# Patient Record
Sex: Male | Born: 1950 | Race: White | Hispanic: No | Marital: Married | Smoking: Former smoker
Health system: Southern US, Community
[De-identification: ages and names within clinical notes are randomized; demographics above are authoritative.]

## PROBLEM LIST (undated history)

## (undated) DIAGNOSIS — J45909 Unspecified asthma, uncomplicated: Secondary | ICD-10-CM

## (undated) DIAGNOSIS — T7840XA Allergy, unspecified, initial encounter: Secondary | ICD-10-CM

## (undated) DIAGNOSIS — E119 Type 2 diabetes mellitus without complications: Secondary | ICD-10-CM

## (undated) DIAGNOSIS — G4733 Obstructive sleep apnea (adult) (pediatric): Secondary | ICD-10-CM

## (undated) DIAGNOSIS — S46001A Unspecified injury of muscle(s) and tendon(s) of the rotator cuff of right shoulder, initial encounter: Secondary | ICD-10-CM

## (undated) DIAGNOSIS — J449 Chronic obstructive pulmonary disease, unspecified: Secondary | ICD-10-CM

## (undated) DIAGNOSIS — M1990 Unspecified osteoarthritis, unspecified site: Secondary | ICD-10-CM

## (undated) DIAGNOSIS — E782 Mixed hyperlipidemia: Secondary | ICD-10-CM

## (undated) DIAGNOSIS — G4730 Sleep apnea, unspecified: Secondary | ICD-10-CM

## (undated) DIAGNOSIS — I10 Essential (primary) hypertension: Secondary | ICD-10-CM

## (undated) DIAGNOSIS — I499 Cardiac arrhythmia, unspecified: Secondary | ICD-10-CM

## (undated) DIAGNOSIS — K219 Gastro-esophageal reflux disease without esophagitis: Secondary | ICD-10-CM

## (undated) DIAGNOSIS — E559 Vitamin D deficiency, unspecified: Secondary | ICD-10-CM

## (undated) DIAGNOSIS — R7302 Impaired glucose tolerance (oral): Secondary | ICD-10-CM

## (undated) DIAGNOSIS — M519 Unspecified thoracic, thoracolumbar and lumbosacral intervertebral disc disorder: Secondary | ICD-10-CM

## (undated) DIAGNOSIS — N189 Chronic kidney disease, unspecified: Secondary | ICD-10-CM

## (undated) HISTORY — DX: Unspecified injury of muscle(s) and tendon(s) of the rotator cuff of right shoulder, initial encounter: S46.001A

## (undated) HISTORY — DX: Unspecified asthma, uncomplicated: J45.909

## (undated) HISTORY — DX: Unspecified thoracic, thoracolumbar and lumbosacral intervertebral disc disorder: M51.9

## (undated) HISTORY — DX: Mixed hyperlipidemia: E78.2

## (undated) HISTORY — DX: Chronic obstructive pulmonary disease, unspecified: J44.9

## (undated) HISTORY — DX: Sleep apnea, unspecified: G47.30

## (undated) HISTORY — DX: Essential (primary) hypertension: I10

## (undated) HISTORY — DX: Allergy, unspecified, initial encounter: T78.40XA

## (undated) HISTORY — DX: Impaired glucose tolerance (oral): R73.02

## (undated) HISTORY — DX: Cardiac arrhythmia, unspecified: I49.9

## (undated) HISTORY — DX: Vitamin D deficiency, unspecified: E55.9

## (undated) HISTORY — DX: Gastro-esophageal reflux disease without esophagitis: K21.9

## (undated) HISTORY — DX: Obstructive sleep apnea (adult) (pediatric): G47.33

## (undated) HISTORY — DX: Unspecified osteoarthritis, unspecified site: M19.90

## (undated) HISTORY — DX: Type 2 diabetes mellitus without complications: E11.9

## (undated) HISTORY — DX: Chronic kidney disease, unspecified: N18.9

---

## 2008-10-25 ENCOUNTER — Encounter: Payer: Self-pay | Admitting: Orthopedic Surgery

## 2008-10-26 ENCOUNTER — Ambulatory Visit: Payer: Self-pay | Admitting: Orthopedic Surgery

## 2008-10-26 DIAGNOSIS — I10 Essential (primary) hypertension: Secondary | ICD-10-CM | POA: Insufficient documentation

## 2008-10-26 DIAGNOSIS — G5603 Carpal tunnel syndrome, bilateral upper limbs: Secondary | ICD-10-CM | POA: Insufficient documentation

## 2008-10-26 DIAGNOSIS — G5600 Carpal tunnel syndrome, unspecified upper limb: Secondary | ICD-10-CM

## 2008-10-26 DIAGNOSIS — S60459A Superficial foreign body of unspecified finger, initial encounter: Secondary | ICD-10-CM | POA: Insufficient documentation

## 2010-04-21 ENCOUNTER — Emergency Department (HOSPITAL_COMMUNITY)
Admission: EM | Admit: 2010-04-21 | Discharge: 2010-04-21 | Payer: Self-pay | Source: Home / Self Care | Admitting: Emergency Medicine

## 2010-07-25 ENCOUNTER — Encounter: Payer: Self-pay | Admitting: Internal Medicine

## 2010-08-02 ENCOUNTER — Ambulatory Visit (HOSPITAL_COMMUNITY)
Admission: RE | Admit: 2010-08-02 | Discharge: 2010-08-02 | Payer: Self-pay | Source: Home / Self Care | Attending: Internal Medicine | Admitting: Internal Medicine

## 2010-08-06 ENCOUNTER — Encounter: Payer: Self-pay | Admitting: Internal Medicine

## 2010-09-17 NOTE — Letter (Signed)
Summary: [facility] ORDER/TRIAGE  [facility] ORDER/TRIAGE   Imported By: Rexene Alberts 07/25/2010 11:57:45  _____________________________________________________________________  External Attachment:    Type:   Image     Comment:   External Document

## 2010-09-19 NOTE — Letter (Signed)
Summary: Patient Notice, Colon Biopsy Results  [facility]  [address]   [place], [place] [address]   Phone: [phone]  Fax: [phone]       August 06, 2010   Jerome Sanchez [address] [place], [place]  [address] 09-07-1950    Dear [name],  I am pleased to inform you that the biopsies taken during your recent colonoscopy did not show any evidence of cancer upon pathologic examination.  Additional information/recommendations:  No further action is needed at this time.  Please follow-up with your primary care physician for your other healthcare needs.  You should have a repeat colonoscopy examination  in 7 years.  Please call us if you are having persistent problems or have questions about your condition that have not been fully answered at this time.  Sincerely,    R. Roetta Sessions MD, FACP [facility] Ph: [phone]    Fax: [phone]   Appended Document: Patient Notice, Colon Biopsy Results letter mailed to pt  Appended Document: Patient Notice, Colon Biopsy Results reminder in computer

## 2012-07-26 ENCOUNTER — Other Ambulatory Visit: Payer: Self-pay

## 2012-07-26 DIAGNOSIS — R0600 Dyspnea, unspecified: Secondary | ICD-10-CM

## 2012-07-27 ENCOUNTER — Ambulatory Visit: Payer: Self-pay | Admitting: Cardiology

## 2012-08-05 ENCOUNTER — Ambulatory Visit (HOSPITAL_COMMUNITY)
Admission: RE | Admit: 2012-08-05 | Discharge: 2012-08-05 | Disposition: A | Discharge status: Home / Self Care | Payer: BC Managed Care – PPO | Source: Ambulatory Visit | Attending: Family Medicine | Admitting: Family Medicine

## 2012-08-05 ENCOUNTER — Other Ambulatory Visit: Payer: Self-pay | Admitting: Family Medicine

## 2012-08-05 ENCOUNTER — Encounter: Payer: Self-pay | Admitting: *Deleted

## 2012-08-05 ENCOUNTER — Encounter: Payer: Self-pay | Admitting: Cardiology

## 2012-08-05 ENCOUNTER — Ambulatory Visit (INDEPENDENT_AMBULATORY_CARE_PROVIDER_SITE_OTHER): Payer: BC Managed Care – PPO | Admitting: Cardiology

## 2012-08-05 VITALS — BP 138/84 | HR 86 | Ht 70.0 in | Wt 220.0 lb

## 2012-08-05 DIAGNOSIS — R0600 Dyspnea, unspecified: Secondary | ICD-10-CM

## 2012-08-05 DIAGNOSIS — Z87891 Personal history of nicotine dependence: Secondary | ICD-10-CM | POA: Insufficient documentation

## 2012-08-05 DIAGNOSIS — R9431 Abnormal electrocardiogram [ECG] [EKG]: Secondary | ICD-10-CM

## 2012-08-05 DIAGNOSIS — E782 Mixed hyperlipidemia: Secondary | ICD-10-CM | POA: Insufficient documentation

## 2012-08-05 DIAGNOSIS — R0609 Other forms of dyspnea: Secondary | ICD-10-CM | POA: Insufficient documentation

## 2012-08-05 DIAGNOSIS — R0602 Shortness of breath: Secondary | ICD-10-CM

## 2012-08-05 DIAGNOSIS — I10 Essential (primary) hypertension: Secondary | ICD-10-CM | POA: Insufficient documentation

## 2012-08-05 DIAGNOSIS — R079 Chest pain, unspecified: Secondary | ICD-10-CM

## 2012-08-05 DIAGNOSIS — R0989 Other specified symptoms and signs involving the circulatory and respiratory systems: Secondary | ICD-10-CM | POA: Insufficient documentation

## 2012-08-05 MED ORDER — ALBUTEROL SULFATE (5 MG/ML) 0.5% IN NEBU
2.5000 mg | INHALATION_SOLUTION | Freq: Once | RESPIRATORY_TRACT | Status: AC
  Administered 2012-08-05: 2.5 mg via RESPIRATORY_TRACT

## 2012-08-05 NOTE — Patient Instructions (Addendum)
YOUR PHYSICIAN RECOMMENDS THAT YOU HAVE AN EXERCISE MYOVIEW  WE WILL CALL YOU WITH THE RESULTS AND MAKE DECISION AT THAT TIME IF/WHEN YOU WILL NEED A FOLLOW UP APPOINTMENT

## 2012-08-05 NOTE — Assessment & Plan Note (Signed)
Recently stable cardiac regimen. Keep followup with Dr. Gerda Diss.

## 2012-08-05 NOTE — Progress Notes (Signed)
Clinical Summary Jerome Sanchez is a 61 y.o.male referred for cardiology consultation by Dr. Gerda Diss. He reports a six-month history of progressive dyspnea on exertion, NYHA class 2-3 at this point. Denies any definite chest pain symptoms, although does have some occasional shoulder and arm discomfort. He has no prior history of cardiac disease, with cardiac risk factors noted below.  Recent ECG reviewed showing sinus rhythm with nonspecific ST changes. Lab work from September reviewed revealing potassium 5.2, BUN 21, creatinine 0.9, cholesterol 213, triglycerides 74, HDL 59, LDL 139, normal LFTs.  ECG today shows sinus rhythm with Q in lead III, early transition. Chest x-ray shows no acute abnormalities with mild tortuosity of the thoracic aorta. PFTs obtained and pending. He is not aware of any chronic lung disease at baseline.  He states that medications have been stable other than recent increase in Pravachol for more aggressive treatment of hyperlipidemia.   Allergies  Allergen Reactions  . Penicillins     N&V    Current Outpatient Prescriptions  Medication Sig Dispense Refill  . enalapril (VASOTEC) 20 MG tablet Take 20 mg by mouth 2 (two) times daily.      . lansoprazole (PREVACID) 15 MG capsule Take 15 mg by mouth as needed.      . naproxen sodium (ANAPROX) 220 MG tablet Take 220 mg by mouth as needed.      . pravastatin (PRAVACHOL) 40 MG tablet Take 40 mg by mouth daily.        Past Medical History  Diagnosis Date  . Essential hypertension, benign   . Mixed hyperlipidemia   . Impaired glucose tolerance   . Injury of right rotator cuff   . Lumbar disc disease     No past surgical history on file.  Family History  Problem Relation Age of Onset  . Hypertension Sister   . Diabetes type II Father     Social History Jerome Sanchez reports that he has quit smoking. His smoking use included Cigarettes. He does not have any smokeless tobacco history on file. Jerome Sanchez reports  that he does not drink alcohol.  Review of Systems No palpitations or syncope. No orthopnea or PND. Occasional ankle edema. Reports carpal tunnel symptoms. Otherwise negative.  Physical Examination Filed Vitals:   08/05/12 1346  BP: 138/84  Pulse: 86   Filed Weights   08/05/12 1346  Weight: 220 lb (99.791 kg)   Overweight male in no acute distress. HEENT: Conjunctiva and lids normal, oropharynx clear. Neck: Supple, no elevated JVP or carotid bruits, no thyromegaly. Lungs: Clear to auscultation, nonlabored breathing at rest. Cardiac: Regular rate and rhythm, no S3 or significant systolic murmur, no pericardial rub. Abdomen: Soft, nontender, bowel sounds present, no guarding or rebound. Extremities: No pitting edema, distal pulses 2+. Skin: Warm and dry. Musculoskeletal: No kyphosis. Neuropsychiatric: Alert and oriented x3, affect grossly appropriate.   Problem List and Plan   Shortness of breath Progressive over the last 6 months, now NYHA class 2-3. No definite exertional chest discomfort reported. ECG is abnormal at baseline as outlined, cardiac risk factors include age and gender, hypertension, hyperlipidemia, impaired glucose tolerance. Recent chest x-ray without acute findings. PFTs are pending. We did discuss further objective ischemic evaluation in light of his cardiac risk factor profile and progressive symptoms, and an exercise Myoview will be obtained.  Abnormal ECG Abnormal at baseline, Q. in lead 3, early transition pattern. Further ischemic workup to be obtained.  Essential hypertension, benign Recently stable cardiac regimen. Keep followup  with Dr. Gerda Diss.  Mixed hyperlipidemia On statin therapy, Pravachol dose recently increased.    Jonelle Sidle, M.D., F.A.C.C.

## 2012-08-05 NOTE — Assessment & Plan Note (Signed)
On statin therapy, Pravachol dose recently increased.

## 2012-08-05 NOTE — Assessment & Plan Note (Signed)
Progressive over the last 6 months, now NYHA class 2-3. No definite exertional chest discomfort reported. ECG is abnormal at baseline as outlined, cardiac risk factors include age and gender, hypertension, hyperlipidemia, impaired glucose tolerance. Recent chest x-ray without acute findings. PFTs are pending. We did discuss further objective ischemic evaluation in light of his cardiac risk factor profile and progressive symptoms, and an exercise Myoview will be obtained.

## 2012-08-05 NOTE — Assessment & Plan Note (Signed)
Abnormal at baseline, Q. in lead 3, early transition pattern. Further ischemic workup to be obtained.

## 2012-08-06 NOTE — Procedures (Signed)
NAME:  Jerome Sanchez, Jerome Sanchez               ACCOUNT NO.:  [id number]  MEDICAL RECORD NO.:  [id number]  LOCATION:                                 FACILITY:  PHYSICIAN:  Ulyses Panico L. Juanetta Gosling, M.D.DATE OF BIRTH:  06-Nov-1950  DATE OF PROCEDURE:  08/05/2012 DATE OF DISCHARGE:                           PULMONARY FUNCTION TEST   Reason for pulmonary function testing is dyspnea. 1. Spirometry shows a mild-to-moderate ventilatory defect and evidence     of airflow obstruction. 2. There is improvement with inhaled bronchodilator that reaches the     level of significant. 3. Noting the patient's smoking history and occupational history, this     study is consistent with COPD or occupational exposure.     Zauria Dombek L. Juanetta Gosling, M.D.     ELH/MEDQ  D:  08/05/2012  T:  08/06/2012  Job:  [id number]  cc:   Donna Bernard, M.D. Fax: [phone]

## 2012-08-23 ENCOUNTER — Encounter: Payer: Self-pay | Admitting: Cardiology

## 2012-08-24 ENCOUNTER — Encounter (HOSPITAL_COMMUNITY)
Admission: RE | Admit: 2012-08-24 | Discharge: 2012-08-24 | Disposition: A | Discharge status: Home / Self Care | Payer: BC Managed Care – PPO | Source: Ambulatory Visit | Attending: Cardiology | Admitting: Cardiology

## 2012-08-24 ENCOUNTER — Encounter (HOSPITAL_COMMUNITY): Payer: Self-pay | Admitting: Cardiology

## 2012-08-24 ENCOUNTER — Ambulatory Visit (HOSPITAL_COMMUNITY)
Admission: RE | Admit: 2012-08-24 | Discharge: 2012-08-24 | Disposition: A | Discharge status: Home / Self Care | Payer: BC Managed Care – PPO | Source: Ambulatory Visit | Attending: Cardiology | Admitting: Cardiology

## 2012-08-24 DIAGNOSIS — R0602 Shortness of breath: Secondary | ICD-10-CM | POA: Insufficient documentation

## 2012-08-24 DIAGNOSIS — E785 Hyperlipidemia, unspecified: Secondary | ICD-10-CM | POA: Insufficient documentation

## 2012-08-24 DIAGNOSIS — R079 Chest pain, unspecified: Secondary | ICD-10-CM

## 2012-08-24 DIAGNOSIS — I10 Essential (primary) hypertension: Secondary | ICD-10-CM | POA: Insufficient documentation

## 2012-08-24 DIAGNOSIS — R9431 Abnormal electrocardiogram [ECG] [EKG]: Secondary | ICD-10-CM

## 2012-08-24 DIAGNOSIS — R7309 Other abnormal glucose: Secondary | ICD-10-CM | POA: Insufficient documentation

## 2012-08-24 DIAGNOSIS — R0609 Other forms of dyspnea: Secondary | ICD-10-CM | POA: Insufficient documentation

## 2012-08-24 DIAGNOSIS — R0989 Other specified symptoms and signs involving the circulatory and respiratory systems: Secondary | ICD-10-CM | POA: Insufficient documentation

## 2012-08-24 MED ORDER — TECHNETIUM TC 99M SESTAMIBI - CARDIOLITE
30.0000 | Freq: Once | INTRAVENOUS | Status: AC | PRN
  Administered 2012-08-24: 11:00:00 27 via INTRAVENOUS

## 2012-08-24 MED ORDER — SODIUM CHLORIDE 0.9 % IJ SOLN
INTRAMUSCULAR | Status: AC
  Administered 2012-08-24: 10 mL via INTRAVENOUS
  Filled 2012-08-24: qty 10

## 2012-08-24 MED ORDER — TECHNETIUM TC 99M SESTAMIBI - CARDIOLITE
10.0000 | Freq: Once | INTRAVENOUS | Status: AC | PRN
  Administered 2012-08-24: 09:00:00 9 via INTRAVENOUS

## 2012-08-24 NOTE — Progress Notes (Signed)
Stress Lab Nurses Notes - Jerome Sanchez  Jerome Sanchez 08/24/2012 Reason for doing test: Chest Pain and Dyspnea Type of test: Stress Cardiolite Nurse performing test: Parke Poisson, RN Nuclear Medicine Tech: Lyndel Pleasure Echo Tech: Not Applicable MD performing test: Ival Bible & Joni Reining NP Family MD: Lubertha South Test explained and consent signed: yes IV started: 22g jelco, Saline lock flushed, No redness or edema and Saline lock started in radiology Symptoms: SOB Treatment/Intervention: None Reason test stopped: fatigue and SOB After recovery IV was: Discontinued via X-ray tech and No redness or edema Patient to return to Nuc. Med at : 11::45 Patient discharged: Home Patient's Condition upon discharge was: stable Comments: During test peak BP 221/96 & HR 157.  Recovery BP 153/99 & HR 93.  Symptom resolved in recovery. Erskine Speed T

## 2012-09-02 LAB — PULMONARY FUNCTION TEST

## 2012-11-08 ENCOUNTER — Encounter: Payer: Self-pay | Admitting: *Deleted

## 2012-11-10 ENCOUNTER — Encounter: Payer: Self-pay | Admitting: Family Medicine

## 2012-11-10 ENCOUNTER — Ambulatory Visit (INDEPENDENT_AMBULATORY_CARE_PROVIDER_SITE_OTHER): Payer: BLUE CROSS/BLUE SHIELD | Admitting: Family Medicine

## 2012-11-10 VITALS — BP 122/82 | HR 80 | Ht 70.0 in | Wt 223.2 lb

## 2012-11-10 DIAGNOSIS — J441 Chronic obstructive pulmonary disease with (acute) exacerbation: Secondary | ICD-10-CM | POA: Insufficient documentation

## 2012-11-10 DIAGNOSIS — E782 Mixed hyperlipidemia: Secondary | ICD-10-CM

## 2012-11-10 DIAGNOSIS — R7301 Impaired fasting glucose: Secondary | ICD-10-CM | POA: Insufficient documentation

## 2012-11-10 DIAGNOSIS — I10 Essential (primary) hypertension: Secondary | ICD-10-CM

## 2012-11-10 NOTE — Progress Notes (Signed)
  Subjective:    Patient ID: Jerome Sanchez, male    DOB: March 05, 1951, 62 y.o.   MRN: [id number]  Hypertension The current episode started more than 1 year ago. The problem is unchanged. The problem is controlled. Pertinent negatives include no chest pain (chest pain now gone). There are no associated agents to hypertension. There are no known risk factors for coronary artery disease. There are no compliance problems.    Bps mostly 120 systolic. Claims compliance with lipid meds. Claims compliance with blood pressure meds. Recent diagnosis of COPD. Prescribed albuterol for this. Notes that albuterol has helped considerably.  Diet overall better less red meat fish and chicken. Review of Systems  Constitutional: Positive for fatigue (less).  Respiratory: Positive for wheezing (inhaler wheezing).   Cardiovascular: Negative for chest pain (chest pain now gone).   ROS otherwise negative.    Objective:   Physical Exam  Alert HEENT normal. Lungs clear. Heart regular in rhythm. Ankles without edema.      Assessment & Plan:  Impression #1 hypertension good control. #2 hyperlipidemia results shared with patient. Improved. #3 impaired fasting glucose ongoing issue discuss. #4 COPD clinically stable. Plan as per orders. Diet exercise discussed in encourage. WSL

## 2012-11-10 NOTE — Patient Instructions (Addendum)
Stay with regular exercise and regular diet.dr Pernell Dupre for dentist, dr Charise Killian for optometry.

## 2013-01-24 ENCOUNTER — Telehealth: Payer: Self-pay | Admitting: Family Medicine

## 2013-01-24 ENCOUNTER — Other Ambulatory Visit: Payer: Self-pay | Admitting: *Deleted

## 2013-01-24 MED ORDER — PRAVASTATIN SODIUM 40 MG PO TABS: 40.0000 mg | ORAL_TABLET | Freq: Every day | ORAL | Status: DC

## 2013-01-24 MED ORDER — ENALAPRIL MALEATE 20 MG PO TABS: 20.0000 mg | ORAL_TABLET | Freq: Two times a day (BID) | ORAL | Status: DC

## 2013-01-24 NOTE — Telephone Encounter (Signed)
After speaking with Nurse this prescription was faxed.  Will fax again

## 2013-01-24 NOTE — Telephone Encounter (Signed)
Patient needs a new RX of pravastatin 400 mg and enalapril 20 mg faxed to him at [phone]

## 2013-01-24 NOTE — Telephone Encounter (Signed)
Pravastatin 40mg  #30 one po qd with 3 refills and enalapril 20mg  #60 one Bid with 3 refills faxed. Pt notified

## 2013-01-24 NOTE — Telephone Encounter (Signed)
[facility] in [place] states they have not received the prescription for Enlapril and Pravastatin.

## 2013-03-03 ENCOUNTER — Ambulatory Visit: Payer: Self-pay | Admitting: Ophthalmology

## 2013-03-03 DIAGNOSIS — I10 Essential (primary) hypertension: Secondary | ICD-10-CM

## 2013-03-15 ENCOUNTER — Ambulatory Visit: Payer: Self-pay | Admitting: Ophthalmology

## 2013-03-30 ENCOUNTER — Ambulatory Visit: Payer: Self-pay | Admitting: Ophthalmology

## 2013-04-15 ENCOUNTER — Other Ambulatory Visit: Payer: Self-pay | Admitting: *Deleted

## 2013-04-15 MED ORDER — PRAVASTATIN SODIUM 40 MG PO TABS: 40.0000 mg | ORAL_TABLET | Freq: Every day | ORAL | Status: DC

## 2013-06-20 ENCOUNTER — Telehealth: Payer: Self-pay | Admitting: Family Medicine

## 2013-06-20 ENCOUNTER — Other Ambulatory Visit: Payer: Self-pay | Admitting: Family Medicine

## 2013-06-20 NOTE — Telephone Encounter (Signed)
Patient needs BW paperwork for appointment. He says you can leave a message with anyone who answers that number.

## 2013-06-22 ENCOUNTER — Other Ambulatory Visit: Payer: Self-pay | Admitting: *Deleted

## 2013-06-22 DIAGNOSIS — Z79899 Other long term (current) drug therapy: Secondary | ICD-10-CM

## 2013-06-22 DIAGNOSIS — E785 Hyperlipidemia, unspecified: Secondary | ICD-10-CM

## 2013-06-22 DIAGNOSIS — Z125 Encounter for screening for malignant neoplasm of prostate: Secondary | ICD-10-CM

## 2013-06-22 NOTE — Telephone Encounter (Signed)
Lip liv M7 PSA

## 2013-06-22 NOTE — Telephone Encounter (Signed)
Left message to notify patient bloodwork order was put in and he can go over to lab without paper order. Notified  Him to be fasting for 8 hours.

## 2013-06-25 LAB — HEPATIC FUNCTION PANEL
Alkaline Phosphatase: 52 U/L (ref 39–117)
Indirect Bilirubin: 0.5 mg/dL (ref 0.0–0.9)
Total Protein: 7.3 g/dL (ref 6.0–8.3)

## 2013-06-25 LAB — BASIC METABOLIC PANEL
BUN: 31 mg/dL — ABNORMAL HIGH (ref 6–23)
CO2: 24 mEq/L (ref 19–32)
Chloride: 107 mEq/L (ref 96–112)
Creat: 0.9 mg/dL (ref 0.50–1.35)

## 2013-06-25 LAB — LIPID PANEL
LDL Cholesterol: 182 mg/dL — ABNORMAL HIGH (ref 0–99)
Triglycerides: 79 mg/dL (ref <150)

## 2013-07-04 ENCOUNTER — Encounter: Payer: Self-pay | Admitting: Family Medicine

## 2013-07-04 ENCOUNTER — Ambulatory Visit (INDEPENDENT_AMBULATORY_CARE_PROVIDER_SITE_OTHER): Payer: BLUE CROSS/BLUE SHIELD | Admitting: Family Medicine

## 2013-07-04 VITALS — BP 122/80 | Ht 70.0 in | Wt 218.2 lb

## 2013-07-04 DIAGNOSIS — R7301 Impaired fasting glucose: Secondary | ICD-10-CM

## 2013-07-04 DIAGNOSIS — R9431 Abnormal electrocardiogram [ECG] [EKG]: Secondary | ICD-10-CM

## 2013-07-04 DIAGNOSIS — I10 Essential (primary) hypertension: Secondary | ICD-10-CM

## 2013-07-04 DIAGNOSIS — J441 Chronic obstructive pulmonary disease with (acute) exacerbation: Secondary | ICD-10-CM

## 2013-07-04 DIAGNOSIS — R0602 Shortness of breath: Secondary | ICD-10-CM

## 2013-07-04 MED ORDER — PRAVASTATIN SODIUM 40 MG PO TABS: 40.0000 mg | ORAL_TABLET | Freq: Every day | ORAL | Status: DC

## 2013-07-04 MED ORDER — LANSOPRAZOLE 15 MG PO CPDR: 15.0000 mg | DELAYED_RELEASE_CAPSULE | ORAL | Status: DC | PRN

## 2013-07-04 MED ORDER — ENALAPRIL MALEATE 20 MG PO TABS: ORAL_TABLET | ORAL | Status: DC

## 2013-07-04 NOTE — Progress Notes (Signed)
  Subjective:    Patient ID: Jerome Sanchez, male    DOB: Jan 15, 1951, 62 y.o.   MRN: [id number]  HPI  Patient arrives for a follow up on blood pressure and to discuss lab results.  bilat cataract ops--doing better  Compliant with bp meds. No obvious s e s.,BP up a hair on day's operation.  Chol meds handling well. Diet has improved with low carbohydrate approach. Handling it reasonabley well. Lost swome weight.  Results for orders placed in visit on 06/22/13  LIPID PANEL      Result Value Range   Cholesterol 264 (*) 0 - 200 mg/dL   Triglycerides 79  <564 mg/dL   HDL 66  >33 mg/dL   Total CHOL/HDL Ratio 4.0     VLDL 16  0 - 40 mg/dL   LDL Cholesterol 295 (*) 0 - 99 mg/dL  HEPATIC FUNCTION PANEL      Result Value Range   Total Bilirubin 0.6  0.3 - 1.2 mg/dL   Bilirubin, Direct 0.1  0.0 - 0.3 mg/dL   Indirect Bilirubin 0.5  0.0 - 0.9 mg/dL   Alkaline Phosphatase 52  39 - 117 U/L   AST 18  0 - 37 U/L   ALT 21  0 - 53 U/L   Total Protein 7.3  6.0 - 8.3 g/dL   Albumin 4.3  3.5 - 5.2 g/dL  BASIC METABOLIC PANEL      Result Value Range   Sodium 138  135 - 145 mEq/L   Potassium 4.8  3.5 - 5.3 mEq/L   Chloride 107  96 - 112 mEq/L   CO2 24  19 - 32 mEq/L   Glucose, Bld 114 (*) 70 - 99 mg/dL   BUN 31 (*) 6 - 23 mg/dL   Creat 1.88  4.16 - 6.06 mg/dL   Calcium 30.1  8.4 - 60.1 mg/dL  PSA      Result Value Range   PSA 0.55  <=4.00 ng/mL   Also notes only rare difficulties with wheezing. Uses albuterol only briefly.   Review of Systems No chest pain no back pain no abdominal pain no change in bowel habits ROS otherwise    Objective:   Physical Exam  134 0 82 Alert HEENT normal. Lungs clear. No wheezes. Heart regular in rhythm. Ankles without edema.    blood pressure good on repeat. Abdomen benign. Assessment & Plan:  Impression 1 hypertension good control #2 hyperlipidemia great control. #3 COPD exacerbation only rare. #4 reflux stable. Plan flu shot today. Diet exercise  discussed in encourage. Followup every 6 months. Maintain same meds. WSL WSL

## 2013-12-01 ENCOUNTER — Telehealth: Payer: Self-pay | Admitting: Family Medicine

## 2013-12-01 DIAGNOSIS — E782 Mixed hyperlipidemia: Secondary | ICD-10-CM

## 2013-12-01 DIAGNOSIS — Z79899 Other long term (current) drug therapy: Secondary | ICD-10-CM

## 2013-12-01 NOTE — Telephone Encounter (Signed)
Blood work orders placed in Epic. Patient notified. 

## 2013-12-01 NOTE — Telephone Encounter (Signed)
Pt needs bw orders for appt on 12/30/13 Call when ready

## 2013-12-01 NOTE — Telephone Encounter (Signed)
Patient had lipid, liver, met 7 and PSA on 06/25/13

## 2013-12-01 NOTE — Telephone Encounter (Signed)
Lip liv glu 

## 2013-12-03 LAB — HEPATIC FUNCTION PANEL
ALBUMIN: 4.5 g/dL (ref 3.5–5.2)
ALK PHOS: 46 U/L (ref 39–117)
ALT: 18 U/L (ref 0–53)
AST: 22 U/L (ref 0–37)
BILIRUBIN TOTAL: 0.5 mg/dL (ref 0.2–1.2)
Bilirubin, Direct: 0.1 mg/dL (ref 0.0–0.3)
Indirect Bilirubin: 0.4 mg/dL (ref 0.2–1.2)
TOTAL PROTEIN: 7.1 g/dL (ref 6.0–8.3)

## 2013-12-03 LAB — LIPID PANEL
CHOL/HDL RATIO: 4.2 ratio
Cholesterol: 272 mg/dL — ABNORMAL HIGH (ref 0–200)
HDL: 65 mg/dL (ref >39)
LDL Cholesterol: 194 mg/dL — ABNORMAL HIGH (ref 0–99)
TRIGLYCERIDES: 66 mg/dL (ref <150)
VLDL: 13 mg/dL (ref 0–40)

## 2013-12-03 LAB — GLUCOSE, RANDOM: GLUCOSE: 113 mg/dL — AB (ref 70–99)

## 2013-12-30 ENCOUNTER — Ambulatory Visit (INDEPENDENT_AMBULATORY_CARE_PROVIDER_SITE_OTHER): Payer: BLUE CROSS/BLUE SHIELD | Admitting: Family Medicine

## 2013-12-30 ENCOUNTER — Encounter: Payer: Self-pay | Admitting: Family Medicine

## 2013-12-30 VITALS — BP 128/82 | Ht 70.0 in | Wt 218.0 lb

## 2013-12-30 DIAGNOSIS — J441 Chronic obstructive pulmonary disease with (acute) exacerbation: Secondary | ICD-10-CM

## 2013-12-30 DIAGNOSIS — I10 Essential (primary) hypertension: Secondary | ICD-10-CM

## 2013-12-30 DIAGNOSIS — R7301 Impaired fasting glucose: Secondary | ICD-10-CM

## 2013-12-30 DIAGNOSIS — E782 Mixed hyperlipidemia: Secondary | ICD-10-CM

## 2013-12-30 MED ORDER — ENALAPRIL MALEATE 20 MG PO TABS: ORAL_TABLET | ORAL | Status: DC

## 2013-12-30 MED ORDER — PRAVASTATIN SODIUM 80 MG PO TABS: 80.0000 mg | ORAL_TABLET | Freq: Every day | ORAL | Status: DC

## 2013-12-30 MED ORDER — LANSOPRAZOLE 15 MG PO CPDR: 15.0000 mg | DELAYED_RELEASE_CAPSULE | ORAL | Status: DC | PRN

## 2013-12-30 NOTE — Progress Notes (Signed)
   Subjective:    Patient ID: [name], male    DOB: 02-Jul-1951, 63 y.o.   MRN: [id number]  HPIHypertension. Pt not currently exercise. Follows a healthy diet.   Pt states no other concerns.     Results for orders placed in visit on 12/01/13  LIPID PANEL      Result Value Ref Range   Cholesterol 272 (*) 0 - 200 mg/dL   Triglycerides 66  <150 mg/dL   HDL 65  >39 mg/dL   Total CHOL/HDL Ratio 4.2     VLDL 13  0 - 40 mg/dL   LDL Cholesterol 194 (*) 0 - 99 mg/dL  HEPATIC FUNCTION PANEL      Result Value Ref Range   Total Bilirubin 0.5  0.2 - 1.2 mg/dL   Bilirubin, Direct 0.1  0.0 - 0.3 mg/dL   Indirect Bilirubin 0.4  0.2 - 1.2 mg/dL   Alkaline Phosphatase 46  39 - 117 U/L   AST 22  0 - 37 U/L   ALT 18  0 - 53 U/L   Total Protein 7.1  6.0 - 8.3 g/dL   Albumin 4.5  3.5 - 5.2 g/dL  GLUCOSE, RANDOM      Result Value Ref Range   Glucose, Bld 113 (*) 70 - 99 mg/dL   Had tried low carb diet and noticed weight changes, but have stopped that d and does not feel as well. Doing two to three hrs of cardiopul exercise,  Lipid meds needs refill. Handling well. No obvious side effects. Trying to cut down fats in diet.  History of glucose intolerance. Exercise was not very good disorder. Now starting to be more active. Also watching diet.  Notes ongoing chronic back discomfort. Worse with certain motions. Generally under good control. Review of Systems No chest pain no abdominal pain no weight loss no weight gain no change in bowel habits no blood in stool no fever no chills no rash ROS otherwise    Objective:   Physical Exam  Alert mild malaise. Blood pressure good on repeat. Vitals reviewed. H&T normal. Lungs clear. Heart regular in rhythm. Low back no significant tenderness ankles without edema      Assessment & Plan:  Impression 1 hypertension good control discussed. #2 hyperlipidemia control also good discuss. #3 glucose intolerance ongoing. Not worse not better #4 COPD with  ongoing exertional dyspnea at times. discussed plan diet exercise discussed. Maintain all medications. Importance of compliance discussed. Followup in 6 months. Complete physical at that time. WSL

## 2014-03-23 IMAGING — NM NM MYOCAR SINGLE W/SPECT W/WALL MOTION & EF
2 series · 12 of 12 positions shown · non-contrast
Comparison: none

Ordering Physician: LORRAINE JIM

[place] Physician: [REDACTED]al Data: 61-year-old male with hypertension, hyperlipidemia,
and.  Glucose tolerance, presents with progressive shortness of
breath and is referred for the assessment of ischemia.
NUCLEAR MEDICINE STRESS MYOVIEW STUDY WITH SPECT AND LEFT
VENTRICULAR EJECTION FRACTION
Radionuclide Data: One-day rest/stress protocol performed with
[DATE] mCi of Gc-EEm Myoview.
Stress Data: The patient was exercised on a Bruce protocol for 4
minutes and 35 seconds achieving a maximum workload of seven METS.
Heart rate increased to 157 beats per minute which was 100% of the
maximum age predicted heart rate response.  Peak blood pressure was
221/96.  Shortness of breath reported without chest pain.  No
consistently abnormal ST-segment changes were noted in the standard
leads.  Occasional PVCs without sustained arrhythmia were noted.
EKG: Baseline tracing shows sinus rhythm at 75 beats per minute.
Scintigraphic Data: Analysis of the raw perfusion data finds
diaphragmatic attenuation and also gut uptake adjacent to the
inferior wall.
Tomographic views were obtained using the short axis, vertical long
axis, and horizontal long axis planes.  There is a mild intensity,
fixed inferior defect that is most consistent with soft tissue
attenuation.  No clearly reversible defects to indicate ischemia.
Gated imaging reveals an EDV of 65, ESV of 25, T I D ratio of 0.76,
and LVEF of 61% without focal wall motion abnormality.

[Series 1: cs cardiac tc hi dose · 6.41mm/px · 6 of 511 frames shown]
[frame 43/511]
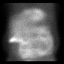
[frame 128/511]
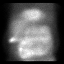
[frame 213/511]
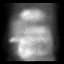
[frame 298/511]
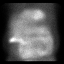
[frame 383/511]
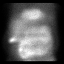
[frame 469/511]
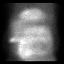

[Series 1: cr cardiac tc low dose · 6.41mm/px · 6 of 64 frames shown]
[frame 6/64]
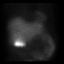
[frame 16/64]
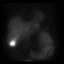
[frame 27/64]
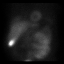
[frame 38/64]
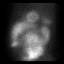
[frame 48/64]
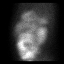
[frame 59/64]
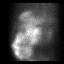

[12 of 12 positions shown; findings below may reference images not displayed]

IMPRESSION: Overall low risk exercise Myoview.  There were no clearly
diagnostic ST-segment abnormalities noted on a consistent basis,
occasional PVCs noted.  Dyspnea was reported without chest pain.
Perfusion imaging is consistent with soft tissue attenuation
affecting the inferior wall, no definite ischemia.  LVEF is normal
61%.

## 2014-06-27 ENCOUNTER — Telehealth: Payer: Self-pay | Admitting: Family Medicine

## 2014-06-27 DIAGNOSIS — I10 Essential (primary) hypertension: Secondary | ICD-10-CM

## 2014-06-27 DIAGNOSIS — Z125 Encounter for screening for malignant neoplasm of prostate: Secondary | ICD-10-CM

## 2014-06-27 DIAGNOSIS — E785 Hyperlipidemia, unspecified: Secondary | ICD-10-CM

## 2014-06-27 NOTE — Telephone Encounter (Signed)
bloodwork orders ready. Pt notified.  

## 2014-06-27 NOTE — Telephone Encounter (Signed)
12/01/13: lip, liv, glu  06/25/13: lip, liv, met7, psa,

## 2014-06-27 NOTE — Telephone Encounter (Signed)
Same as last nov

## 2014-06-27 NOTE — Telephone Encounter (Signed)
Patient calling to see if he needs BW for upcoming appointment?

## 2014-06-28 LAB — HEPATIC FUNCTION PANEL
ALT: 21 U/L (ref 0–53)
AST: 20 U/L (ref 0–37)
Albumin: 4.3 g/dL (ref 3.5–5.2)
Alkaline Phosphatase: 50 U/L (ref 39–117)
BILIRUBIN DIRECT: 0.1 mg/dL (ref 0.0–0.3)
BILIRUBIN INDIRECT: 0.6 mg/dL (ref 0.2–1.2)
Total Bilirubin: 0.7 mg/dL (ref 0.2–1.2)
Total Protein: 7.1 g/dL (ref 6.0–8.3)

## 2014-06-28 LAB — LIPID PANEL
Cholesterol: 217 mg/dL — ABNORMAL HIGH (ref 0–200)
HDL: 69 mg/dL (ref >39)
LDL Cholesterol: 129 mg/dL — ABNORMAL HIGH (ref 0–99)
Total CHOL/HDL Ratio: 3.1 Ratio
Triglycerides: 93 mg/dL (ref <150)
VLDL: 19 mg/dL (ref 0–40)

## 2014-06-28 LAB — BASIC METABOLIC PANEL
BUN: 19 mg/dL (ref 6–23)
CHLORIDE: 104 meq/L (ref 96–112)
CO2: 26 meq/L (ref 19–32)
Calcium: 9.7 mg/dL (ref 8.4–10.5)
Creat: 0.99 mg/dL (ref 0.50–1.35)
GLUCOSE: 119 mg/dL — AB (ref 70–99)
POTASSIUM: 4.3 meq/L (ref 3.5–5.3)
Sodium: 140 mEq/L (ref 135–145)

## 2014-06-29 LAB — PSA: PSA: 0.66 ng/mL (ref <=4.00)

## 2014-07-05 ENCOUNTER — Ambulatory Visit (INDEPENDENT_AMBULATORY_CARE_PROVIDER_SITE_OTHER): Payer: BC Managed Care – PPO | Admitting: Family Medicine

## 2014-07-05 ENCOUNTER — Encounter: Payer: Self-pay | Admitting: Family Medicine

## 2014-07-05 VITALS — BP 134/90 | HR 80 | Ht 68.0 in | Wt 218.0 lb

## 2014-07-05 DIAGNOSIS — E782 Mixed hyperlipidemia: Secondary | ICD-10-CM

## 2014-07-05 DIAGNOSIS — Z0000 Encounter for general adult medical examination without abnormal findings: Secondary | ICD-10-CM

## 2014-07-05 DIAGNOSIS — I10 Essential (primary) hypertension: Secondary | ICD-10-CM

## 2014-07-05 MED ORDER — ENALAPRIL MALEATE 20 MG PO TABS: ORAL_TABLET | ORAL | Status: DC

## 2014-07-05 MED ORDER — PRAVASTATIN SODIUM 80 MG PO TABS: 80.0000 mg | ORAL_TABLET | Freq: Every day | ORAL | Status: DC

## 2014-07-05 MED ORDER — LANSOPRAZOLE 15 MG PO CPDR: 15.0000 mg | DELAYED_RELEASE_CAPSULE | ORAL | Status: DC | PRN

## 2014-07-05 NOTE — Progress Notes (Signed)
Subjective:    Patient ID: Jerome Sanchez, male    DOB: 1951-05-22, 63 y.o.   MRN: [id number]  HPI The patient comes in today for a wellness visit.    A review of their health history was completed.  A review of medications was also completed.  Any needed refills; enalapril, lansoprazole, and pravasatain  Eating habits: health conscious  Falls/  MVA accidents in past few months: none  Regular exercise: walks every other day for about 25 - 30 mins  Specialist pt sees on regular basis: none  Preventative health issues were discussed.   Additional concerns: none  Results for orders placed or performed in visit on 06/27/14  Lipid panel  Result Value Ref Range   Cholesterol 217 (H) 0 - 200 mg/dL   Triglycerides 93 <150 mg/dL   HDL 69 >39 mg/dL   Total CHOL/HDL Ratio 3.1 Ratio   VLDL 19 0 - 40 mg/dL   LDL Cholesterol 129 (H) 0 - 99 mg/dL  Hepatic function panel  Result Value Ref Range   Total Bilirubin 0.7 0.2 - 1.2 mg/dL   Bilirubin, Direct 0.1 0.0 - 0.3 mg/dL   Indirect Bilirubin 0.6 0.2 - 1.2 mg/dL   Alkaline Phosphatase 50 39 - 117 U/L   AST 20 0 - 37 U/L   ALT 21 0 - 53 U/L   Total Protein 7.1 6.0 - 8.3 g/dL   Albumin 4.3 3.5 - 5.2 g/dL  Basic metabolic panel  Result Value Ref Range   Sodium 140 135 - 145 mEq/L   Potassium 4.3 3.5 - 5.3 mEq/L   Chloride 104 96 - 112 mEq/L   CO2 26 19 - 32 mEq/L   Glucose, Bld 119 (H) 70 - 99 mg/dL   BUN 19 6 - 23 mg/dL   Creat 0.99 0.50 - 1.35 mg/dL   Calcium 9.7 8.4 - 10.5 mg/dL  PSA  Result Value Ref Range   PSA 0.66 <=4.00 ng/mL    Walking so so every other d for thirty min  Gives self a good but not excellent diet   Sticking with meds faithfully. Misses once per couple wks  Bps at home generlly 140 over 80s  Review of Systems  Constitutional: Negative for fever, activity change and appetite change.  HENT: Negative for congestion and rhinorrhea.   Eyes: Negative for discharge.  Respiratory: Negative for cough  and wheezing.   Cardiovascular: Negative for chest pain.  Gastrointestinal: Negative for vomiting, abdominal pain and blood in stool.  Genitourinary: Negative for frequency and difficulty urinating.  Musculoskeletal: Negative for neck pain.  Skin: Negative for rash.  Allergic/Immunologic: Negative for environmental allergies and food allergies.  Neurological: Negative for weakness and headaches.  Psychiatric/Behavioral: Negative for agitation.  All other systems reviewed and are negative.      Objective:   Physical Exam  Constitutional: He appears well-developed and well-nourished.  HENT:  Head: Normocephalic and atraumatic.  Right Ear: External ear normal.  Left Ear: External ear normal.  Nose: Nose normal.  Mouth/Throat: Oropharynx is clear and moist.  Eyes: EOM are normal. Pupils are equal, round, and reactive to light.  Neck: Normal range of motion. Neck supple. No thyromegaly present.  Cardiovascular: Normal rate, regular rhythm and normal heart sounds.   No murmur heard. Pulmonary/Chest: Effort normal and breath sounds normal. No respiratory distress. He has no wheezes.  Abdominal: Soft. Bowel sounds are normal. He exhibits no distension and no mass. There is no tenderness.  Genitourinary: Prostate  normal and penis normal.  Musculoskeletal: Normal range of motion. He exhibits no edema.  Lymphadenopathy:    He has no cervical adenopathy.  Neurological: He is alert. He exhibits normal muscle tone.  Skin: Skin is warm and dry. No erythema.  Psychiatric: He has a normal mood and affect. His behavior is normal. Judgment normal.  Vitals reviewed.         Assessment & Plan:  Impression wellness exam #2 hypertension controlled good #3 hyperlipidemia controlled good plan diet exercise discussed. Hemoccult cards. Encouraged to lose weight. Follow-up in 6 months. WSL

## 2014-07-20 ENCOUNTER — Other Ambulatory Visit: Payer: Self-pay | Admitting: *Deleted

## 2014-07-20 DIAGNOSIS — Z0000 Encounter for general adult medical examination without abnormal findings: Secondary | ICD-10-CM

## 2014-07-20 LAB — POC HEMOCCULT BLD/STL (HOME/3-CARD/SCREEN)
FECAL OCCULT BLD: NEGATIVE
FECAL OCCULT BLD: NEGATIVE
Fecal Occult Blood, POC: NEGATIVE

## 2014-12-08 NOTE — Op Note (Signed)
PATIENT NAME:  Jerome Sanchez, Jerome Sanchez MR#:  [id number] DATE OF BIRTH:  1950/11/15  DATE OF PROCEDURE:  03/15/2013  LOCATION:  [place]  PREOPERATIVE DIAGNOSIS: Visually significant cataract of the left eye.   POSTOPERATIVE DIAGNOSIS: Visually significant cataract of the left eye.   OPERATIVE PROCEDURE: Cataract extraction by phacoemulsification with implant of intraocular lens to left eye.   SURGEON: Birder Robson, MD.   ANESTHESIA:  1. Managed anesthesia care.  2. Topical tetracaine drops followed by 2% Xylocaine jelly applied in the preoperative holding area.   COMPLICATIONS: None.   TECHNIQUE:  Stop and chop.  DESCRIPTION OF PROCEDURE: The patient was examined and consented in the preoperative holding area where the aforementioned topical anesthesia was applied to the left eye and then brought back to the Operating Room where the left eye was prepped and draped in the usual sterile ophthalmic fashion and a lid speculum was placed. A paracentesis was created with the side port blade and the anterior chamber was filled with viscoelastic. A near clear corneal incision was performed with the steel keratome. A continuous curvilinear capsulorrhexis was performed with a cystotome followed by the capsulorrhexis forceps. Hydrodissection and hydrodelineation were carried out with BSS on a blunt cannula. The lens was removed in a stop and chop technique and the remaining cortical material was removed with the irrigation-aspiration handpiece. The capsular bag was inflated with viscoelastic and the Technis ZCB00 12.0-diopter lens, serial number [id number], was placed in the capsular bag without complication. The remaining viscoelastic was removed from the eye with the irrigation-aspiration handpiece. The wounds were hydrated. The anterior chamber was flushed with Miostat and the eye was inflated to physiologic pressure. 0.1 mL of cefuroxime concentration 10 mg/mL was placed in the anterior  chamber. The wounds were found to be water tight. The eye was dressed with Vigamox and Combigan. The patient was given protective glasses to wear throughout the day and a shield with which to sleep tonight. The patient was also given drops with which to begin a drop regimen today and will follow-up with me in one day.     ____________________________ Livingston Diones. Bearett Porcaro, MD wlp:dp D: 03/15/2013 11:12:30 ET T: 03/15/2013 11:24:57 ET JOB#: [id number]  cc: Monifa Blanchette L. Chauncey Sciulli, MD, <Dictator> Livingston Diones Jordi Lacko MD ELECTRONICALLY SIGNED 03/18/2013 10:08

## 2014-12-25 ENCOUNTER — Telehealth: Payer: Self-pay | Admitting: Family Medicine

## 2014-12-25 DIAGNOSIS — Z79899 Other long term (current) drug therapy: Secondary | ICD-10-CM

## 2014-12-25 DIAGNOSIS — E785 Hyperlipidemia, unspecified: Secondary | ICD-10-CM

## 2014-12-25 NOTE — Telephone Encounter (Signed)
06/27/14 

## 2014-12-25 NOTE — Telephone Encounter (Signed)
Blood work orders placed in Epic. Patient notified. 

## 2014-12-25 NOTE — Telephone Encounter (Signed)
Lip liv glu 

## 2014-12-25 NOTE — Telephone Encounter (Signed)
Labs please, call when ready  Pt aware of [facility]  Last labs 11/10 Lip, BMP, Hep fun, PSA

## 2014-12-31 LAB — HEPATIC FUNCTION PANEL
ALK PHOS: 62 IU/L (ref 39–117)
ALT: 18 IU/L (ref 0–44)
AST: 20 IU/L (ref 0–40)
Albumin: 4.5 g/dL (ref 3.6–4.8)
BILIRUBIN TOTAL: 0.4 mg/dL (ref 0.0–1.2)
BILIRUBIN, DIRECT: 0.14 mg/dL (ref 0.00–0.40)
TOTAL PROTEIN: 7.2 g/dL (ref 6.0–8.5)

## 2014-12-31 LAB — GLUCOSE, RANDOM: Glucose: 114 mg/dL — ABNORMAL HIGH (ref 65–99)

## 2014-12-31 LAB — LIPID PANEL
Chol/HDL Ratio: 3 ratio units (ref 0.0–5.0)
Cholesterol, Total: 213 mg/dL — ABNORMAL HIGH (ref 100–199)
HDL: 70 mg/dL (ref >39)
LDL Calculated: 116 mg/dL — ABNORMAL HIGH (ref 0–99)
Triglycerides: 134 mg/dL (ref 0–149)
VLDL Cholesterol Cal: 27 mg/dL (ref 5–40)

## 2015-01-05 ENCOUNTER — Encounter: Payer: Self-pay | Admitting: Family Medicine

## 2015-01-05 ENCOUNTER — Ambulatory Visit (INDEPENDENT_AMBULATORY_CARE_PROVIDER_SITE_OTHER): Payer: BLUE CROSS/BLUE SHIELD | Admitting: Family Medicine

## 2015-01-05 VITALS — BP 120/84 | Ht 64.0 in | Wt 209.4 lb

## 2015-01-05 DIAGNOSIS — R7301 Impaired fasting glucose: Secondary | ICD-10-CM | POA: Diagnosis not present

## 2015-01-05 DIAGNOSIS — E785 Hyperlipidemia, unspecified: Secondary | ICD-10-CM

## 2015-01-05 DIAGNOSIS — I10 Essential (primary) hypertension: Secondary | ICD-10-CM

## 2015-01-05 MED ORDER — ENALAPRIL MALEATE 20 MG PO TABS: ORAL_TABLET | ORAL | Status: DC

## 2015-01-05 MED ORDER — PRAVASTATIN SODIUM 80 MG PO TABS: 80.0000 mg | ORAL_TABLET | Freq: Every day | ORAL | Status: DC

## 2015-01-05 NOTE — Progress Notes (Signed)
   Subjective:    Patient ID: Jerome Sanchez, male    DOB: [birth year], 63 y.o.   MRN: [id number]  Hypertension This is a chronic problem. The current episode started more than 1 year ago. The problem has been gradually improving since onset. There are no associated agents to hypertension. There are no known risk factors for coronary artery disease. Treatments tried: enalapril. The current treatment provides significant improvement. There are no compliance problems.    Patient states that he has no concerns at this time.   Results for orders placed or performed in visit on 12/25/14  Lipid panel  Result Value Ref Range   Cholesterol, Total 213 (H) 100 - 199 mg/dL   Triglycerides 134 0 - 149 mg/dL   HDL 70 >39 mg/dL   VLDL Cholesterol Cal 27 5 - 40 mg/dL   LDL Calculated 116 (H) 0 - 99 mg/dL   Chol/HDL Ratio 3.0 0.0 - 5.0 ratio units  Hepatic function panel  Result Value Ref Range   Total Protein 7.2 6.0 - 8.5 g/dL   Albumin 4.5 3.6 - 4.8 g/dL   Bilirubin Total 0.4 0.0 - 1.2 mg/dL   Bilirubin, Direct 0.14 0.00 - 0.40 mg/dL   Alkaline Phosphatase 62 39 - 117 IU/L   AST 20 0 - 40 IU/L   ALT 18 0 - 44 IU/L  Glucose, random  Result Value Ref Range   Glucose 114 (H) 65 - 99 mg/dL   BP elsewhere is often elv when pt chks  Most numbers 120 ish syst   And 100ish dias  Patient trying to cut sugar down at this time. Having only fair success with diet. Not exercising as much as he hoped.  Occasional shortness of breath with exertion. None serious.  Reflux ongoing but under control.  Nose he has an elevated LDL. Working hard on trying to cut that down his diet. Review of Systems No headache no chest pain some back pain no abdominal pain no change in bowel habits    Objective:   Physical Exam Alert no apparent distress blood pressure good on repeat HEENT normal. Lungs clear heart regular rhythm ankles without edema       Assessment & Plan:  Impression hypertension controlled #2  impaired fasting glucose ongoing challenge discussed #3 hyperlipidemia next bag LDL is high which helps protect discussed #4 history of COPD no longer smoking unfortunately plan diet exercise discussed. Maintain same medications. Recheck in 6 months for  plus disease oriented visit WSL

## 2015-06-26 ENCOUNTER — Telehealth: Payer: Self-pay | Admitting: Family Medicine

## 2015-06-26 DIAGNOSIS — E785 Hyperlipidemia, unspecified: Secondary | ICD-10-CM

## 2015-06-26 DIAGNOSIS — Z125 Encounter for screening for malignant neoplasm of prostate: Secondary | ICD-10-CM

## 2015-06-26 DIAGNOSIS — Z79899 Other long term (current) drug therapy: Secondary | ICD-10-CM

## 2015-06-26 NOTE — Telephone Encounter (Signed)
Lip liv m7 psa 

## 2015-06-26 NOTE — Telephone Encounter (Signed)
12/30/14 last labs Lip Hep Glucose Random  Upcoming appt an wants to know if he needs labs for that visit   Call when sent

## 2015-06-27 NOTE — Telephone Encounter (Signed)
Left message on voicemail notifying patient that blood work has been ordered.  

## 2015-07-02 LAB — BASIC METABOLIC PANEL
BUN / CREAT RATIO: 14 (ref 10–22)
BUN: 13 mg/dL (ref 8–27)
CO2: 25 mmol/L (ref 18–29)
Calcium: 9.3 mg/dL (ref 8.6–10.2)
Chloride: 101 mmol/L (ref 97–106)
Creatinine, Ser: 0.95 mg/dL (ref 0.76–1.27)
GFR calc non Af Amer: 84 mL/min/{1.73_m2} (ref >59)
GFR, EST AFRICAN AMERICAN: 97 mL/min/{1.73_m2} (ref >59)
Glucose: 101 mg/dL — ABNORMAL HIGH (ref 65–99)
Potassium: 4.8 mmol/L (ref 3.5–5.2)
Sodium: 142 mmol/L (ref 136–144)

## 2015-07-02 LAB — LIPID PANEL
CHOL/HDL RATIO: 3 ratio (ref 0.0–5.0)
CHOLESTEROL TOTAL: 218 mg/dL — AB (ref 100–199)
HDL: 72 mg/dL (ref >39)
LDL Calculated: 127 mg/dL — ABNORMAL HIGH (ref 0–99)
TRIGLYCERIDES: 93 mg/dL (ref 0–149)
VLDL Cholesterol Cal: 19 mg/dL (ref 5–40)

## 2015-07-02 LAB — HEPATIC FUNCTION PANEL
ALT: 15 IU/L (ref 0–44)
AST: 20 IU/L (ref 0–40)
Albumin: 4.3 g/dL (ref 3.6–4.8)
Alkaline Phosphatase: 59 IU/L (ref 39–117)
BILIRUBIN TOTAL: 0.6 mg/dL (ref 0.0–1.2)
BILIRUBIN, DIRECT: 0.17 mg/dL (ref 0.00–0.40)
Total Protein: 7.1 g/dL (ref 6.0–8.5)

## 2015-07-02 LAB — PSA: Prostate Specific Ag, Serum: 1 ng/mL (ref 0.0–4.0)

## 2015-07-09 ENCOUNTER — Encounter: Payer: Self-pay | Admitting: Family Medicine

## 2015-07-09 ENCOUNTER — Ambulatory Visit (INDEPENDENT_AMBULATORY_CARE_PROVIDER_SITE_OTHER): Payer: BLUE CROSS/BLUE SHIELD | Admitting: Family Medicine

## 2015-07-09 VITALS — BP 136/90 | HR 80 | Ht 69.0 in | Wt 209.0 lb

## 2015-07-09 DIAGNOSIS — E782 Mixed hyperlipidemia: Secondary | ICD-10-CM | POA: Diagnosis not present

## 2015-07-09 DIAGNOSIS — Z0000 Encounter for general adult medical examination without abnormal findings: Secondary | ICD-10-CM | POA: Diagnosis not present

## 2015-07-09 DIAGNOSIS — I10 Essential (primary) hypertension: Secondary | ICD-10-CM

## 2015-07-09 MED ORDER — ENALAPRIL MALEATE 20 MG PO TABS
ORAL_TABLET | ORAL | Status: DC
Start: 2015-07-09 — End: 2016-01-07

## 2015-07-09 MED ORDER — PRAVASTATIN SODIUM 80 MG PO TABS: 80.0000 mg | ORAL_TABLET | Freq: Every day | ORAL | Status: DC

## 2015-07-09 NOTE — Progress Notes (Signed)
Subjective:    Patient ID: Jerome Sanchez, male    DOB: Mar 30, 1951, 64 y.o.   MRN: [id number]  HPI AWV- Annual Wellness Visit  The patient was seen for their annual wellness visit. The patient's past medical history, surgical history, and family history were reviewed. Pertinent vaccines were reviewed ( tetanus, pneumonia, shingles, flu) The patient's medication list was reviewed and updated.  The height and weight were entered. The patient's current BMI is: 30.86  Cognitive screening was completed. Outcome of Mini - Cog: pass  Falls within the past 6 months: none  Current tobacco usage: none (All patients who use tobacco were given written and verbal information on quitting)  Recent listing of emergency department/hospitalizations over the past year were reviewed.  current specialist the patient sees on a regular basis: dermatology for ezcema.    Medicare annual wellness visit patient questionnaire was reviewed.  A written screening schedule for the patient for the next 5-10 years was given. Appropriate discussion of followup regarding next visit was discussed.  Colonoscopy 2012.   Staying active on a regular basis,  Walking regully at home also.  Results for orders placed or performed in visit on 06/26/15  Lipid panel  Result Value Ref Range   Cholesterol, Total 218 (H) 100 - 199 mg/dL   Triglycerides 93 0 - 149 mg/dL   HDL 72 >39 mg/dL   VLDL Cholesterol Cal 19 5 - 40 mg/dL   LDL Calculated 127 (H) 0 - 99 mg/dL   Chol/HDL Ratio 3.0 0.0 - 5.0 ratio units  Hepatic function panel  Result Value Ref Range   Total Protein 7.1 6.0 - 8.5 g/dL   Albumin 4.3 3.6 - 4.8 g/dL   Bilirubin Total 0.6 0.0 - 1.2 mg/dL   Bilirubin, Direct 0.17 0.00 - 0.40 mg/dL   Alkaline Phosphatase 59 39 - 117 IU/L   AST 20 0 - 40 IU/L   ALT 15 0 - 44 IU/L  Basic metabolic panel  Result Value Ref Range   Glucose 101 (H) 65 - 99 mg/dL   BUN 13 8 - 27 mg/dL   Creatinine, Ser 0.95 0.76 - 1.27  mg/dL   GFR calc non Af Amer 84 >59 mL/min/1.73   GFR calc Af Amer 97 >59 mL/min/1.73   BUN/Creatinine Ratio 14 10 - 22   Sodium 142 136 - 144 mmol/L   Potassium 4.8 3.5 - 5.2 mmol/L   Chloride 101 97 - 106 mmol/L   CO2 25 18 - 29 mmol/L   Calcium 9.3 8.6 - 10.2 mg/dL  PSA  Result Value Ref Range   Prostate Specific Ag, Serum 1.0 0.0 - 4.0 ng/mL   Shoulder aching at times, along with tingly feeling down into the arm. Hx of remote rot cuff injury fifteen yrs ago. Having some pain with extrension  Patient compliant with blood pressure medication. No obvious side effects. Watching salt intake. Generally does not miss a dose.  Compliant with lipid medication. No obvious side effects. Watching fat intake regularly. States does not miss a dose   Review of Systems No headache no chest pain no abdominal pain no challenge bowel habits complete ROS otherwise negative    Objective:   Physical Exam  Constitutional: He appears well-developed and well-nourished.  Moderate obesity noted  HENT:  Head: Normocephalic and atraumatic.  Right Ear: External ear normal.  Left Ear: External ear normal.  Nose: Nose normal.  Mouth/Throat: Oropharynx is clear and moist.  Eyes: EOM are normal. Pupils  are equal, round, and reactive to light.  Neck: Normal range of motion. Neck supple. No thyromegaly present.  Cardiovascular: Normal rate, regular rhythm and normal heart sounds.   No murmur heard. Pulmonary/Chest: Effort normal and breath sounds normal. No respiratory distress. He has no wheezes.  Abdominal: Soft. Bowel sounds are normal. He exhibits no distension and no mass. There is no tenderness.  Genitourinary: Penis normal.  Musculoskeletal: Normal range of motion. He exhibits no edema.  Lymphadenopathy:    He has no cervical adenopathy.  Neurological: He is alert. He exhibits normal muscle tone.  Skin: Skin is warm and dry. No erythema.  Psychiatric: He has a normal mood and affect. His behavior  is normal. Judgment normal.  Vitals reviewed.  positive pain with rotation of shoulder strength intact distal sensation intact Alert       Assessment & Plan:  Impression 1 wellness exam diet exercise discussed up-to-date on colonoscopy #2 hypertension good control maintain same medication #3 hyperlipidemia good control maintain same meds #4 reflux clinically stable #5 nonspecific shoulder symptoms stable for now plan diet exercise discussed. Maintain same medications. Medications reviewed. Up-to-date on vaccines. Follow-up in 6 months. WSL

## 2015-07-25 ENCOUNTER — Other Ambulatory Visit: Payer: Self-pay

## 2015-07-25 DIAGNOSIS — Z0000 Encounter for general adult medical examination without abnormal findings: Secondary | ICD-10-CM

## 2015-07-25 LAB — POC HEMOCCULT BLD/STL (HOME/3-CARD/SCREEN)
Card #2 Fecal Occult Blod, POC: NEGATIVE
Card #3 Fecal Occult Blood, POC: NEGATIVE
Fecal Occult Blood, POC: NEGATIVE

## 2015-08-20 ENCOUNTER — Ambulatory Visit (INDEPENDENT_AMBULATORY_CARE_PROVIDER_SITE_OTHER): Payer: BLUE CROSS/BLUE SHIELD | Admitting: Internal Medicine

## 2015-08-20 VITALS — BP 166/90 | HR 92 | Temp 97.9°F | Resp 18 | Ht 68.0 in | Wt 211.0 lb

## 2015-08-20 DIAGNOSIS — S61401A Unspecified open wound of right hand, initial encounter: Secondary | ICD-10-CM | POA: Diagnosis not present

## 2015-08-20 DIAGNOSIS — I10 Essential (primary) hypertension: Secondary | ICD-10-CM

## 2015-08-20 NOTE — Progress Notes (Signed)
Subjective:  By signing my name below, I, Raven Small, attest that this documentation has been prepared under the direction and in the presence of Tami Lin, MD.  Electronically Signed: Thea Alken, ED Scribe. 08/20/2015. 6:41 PM.   Patient ID: Jerome Sanchez, male    DOB: 1951/05/05, 65 y.o.   MRN: [id number]  HPI Chief Complaint  Patient presents with  . Laceration    right hand, today   HPI Comments: Jerome Sanchez is a 65 y.o. male who presents to the [facility] complaining of laceration to the palmar aspect to right hand. Laceration was sustained by a nail that was sticking out of a board. Pt removed nail from hand himself. Bleeding is controlled at his time. He is right hand dominant. No numbness, tingling or weakness. Tetanus is UTD, states last received 4 years ago.    Patient Active Problem List   Diagnosis Date Noted  . Impaired fasting glucose 11/10/2012  . COPD exacerbation ([place]) 11/10/2012  . Shortness of breath 08/05/2012  . Abnormal ECG 08/05/2012  . Mixed hyperlipidemia 08/05/2012  . Essential hypertension, benign 10/26/2008  pcp Dr Sharlee Blew Past Medical History  Diagnosis Date  . Essential hypertension, benign   . Mixed hyperlipidemia   . Impaired glucose tolerance   . Injury of right rotator cuff   . Lumbar disc disease    History reviewed. No pertinent past surgical history. Allergies  Allergen Reactions  . Penicillins     N&V   Prior to Admission medications   Medication Sig Start Date End Date Taking? Authorizing Provider  enalapril (VASOTEC) 20 MG tablet TAKE ONE TABLET BY MOUTH TWICE DAILY 07/09/15  Yes Mikey Kirschner, MD  lansoprazole (PREVACID) 15 MG capsule Take 1 capsule (15 mg total) by mouth as needed. 07/05/14  Yes Mikey Kirschner, MD  naproxen sodium (ANAPROX) 220 MG tablet Take 220 mg by mouth as needed.   Yes Historical Provider, MD  OVER THE COUNTER MEDICATION Areds ( omega 3, lutein, zeaxanthin) take one a day    Yes Historical Provider, MD  Potassium 99 MG TABS Take by mouth daily.   Yes Historical Provider, MD  pravastatin (PRAVACHOL) 80 MG tablet Take 1 tablet (80 mg total) by mouth daily. 07/09/15  Yes Mikey Kirschner, MD   Social History   Social History  . Marital Status: Married    Spouse Name: N/A  . Number of Children: N/A  . Years of Education: N/A   Occupational History  . Not on file.   Social History Main Topics  . Smoking status: Former Smoker    Types: Cigarettes  . Smokeless tobacco: Not on file  . Alcohol Use: No  . Drug Use: No  . Sexual Activity: Not on file   Other Topics Concern  . Not on file   Social History Narrative   Review of Systems  Constitutional: Negative for fever and chills.  Respiratory: Negative for shortness of breath.   Cardiovascular: Negative for chest pain, palpitations and leg swelling.  Gastrointestinal: Negative for abdominal pain.  Skin: Positive for wound.  Neurological: Negative for weakness and numbness.   Objective:   Physical Exam  Constitutional: He is oriented to person, place, and time. He appears well-developed and well-nourished. No distress.  HENT:  Head: Normocephalic and atraumatic.  Eyes: Conjunctivae and EOM are normal. Pupils are equal, round, and reactive to light.  Neck: Neck supple.  Cardiovascular: Normal rate.   Pulmonary/Chest: Effort normal.  Musculoskeletal: Normal range of motion.  Neurological: He is alert and oriented to person, place, and time.  Skin: Skin is warm and dry.  Linear laceration R thenar eminence without tendon or nerve involvement  Psychiatric: He has a normal mood and affect. His behavior is normal.  Nursing note and vitals reviewed.  Filed Vitals:   08/20/15 1818  BP: 174/100  Pulse: 92  Temp: 97.9 F (36.6 C)  Resp: 18  Height: 5\' 8"  (1.727 m)  Weight: 211 lb (95.709 kg)  SpO2: 96%     Assessment & Plan:  Wound, open, hand with or without fingers, right, initial  encounter  Essential hypertension, benign --not in control--will f/u his PCP for BP ck and SR  I have completed the patient encounter in its entirety as documented by the scribe, with editing by me where necessary. Miley Blanchett P. Laney Pastor, M.D.

## 2015-08-20 NOTE — Patient Instructions (Signed)

## 2015-08-31 ENCOUNTER — Encounter: Payer: Self-pay | Admitting: Family Medicine

## 2015-08-31 ENCOUNTER — Ambulatory Visit (INDEPENDENT_AMBULATORY_CARE_PROVIDER_SITE_OTHER): Payer: BLUE CROSS/BLUE SHIELD | Admitting: Family Medicine

## 2015-08-31 VITALS — BP 140/88 | Ht 68.0 in | Wt 211.0 lb

## 2015-08-31 DIAGNOSIS — S61401A Unspecified open wound of right hand, initial encounter: Secondary | ICD-10-CM | POA: Diagnosis not present

## 2015-08-31 DIAGNOSIS — I10 Essential (primary) hypertension: Secondary | ICD-10-CM | POA: Diagnosis not present

## 2015-08-31 NOTE — Progress Notes (Signed)
   Subjective:    Patient ID: [name], male    DOB: [birth year], 65 y.o.   MRN: [id number]  HPI  Patient arrives to have sutures remove from right hand. Sutures were placed by urgent care.  Compliant with blood pressure medication. Blood pressure has been elevated recently. Was up when he went to the urgent care center. Next  Had a lacerations and sutures and her 10 days ago.   Review of Systems    no headache no chest pain no shortness of breath Objective:   Physical Exam Blood pressure repeat 130/86 bilateral. HEENT normal lungs clear heart rare rhythm and sutures present removed       Assessment & Plan:  Impression 1 hypertension good control discussed maintain same dose of meds #2 suture removal wound management discussed WSL

## 2016-01-07 ENCOUNTER — Ambulatory Visit (INDEPENDENT_AMBULATORY_CARE_PROVIDER_SITE_OTHER): Payer: BLUE CROSS/BLUE SHIELD | Admitting: Family Medicine

## 2016-01-07 ENCOUNTER — Encounter: Payer: Self-pay | Admitting: Family Medicine

## 2016-01-07 VITALS — BP 126/84 | Ht 68.0 in | Wt 211.4 lb

## 2016-01-07 DIAGNOSIS — E785 Hyperlipidemia, unspecified: Secondary | ICD-10-CM | POA: Diagnosis not present

## 2016-01-07 DIAGNOSIS — R7301 Impaired fasting glucose: Secondary | ICD-10-CM

## 2016-01-07 DIAGNOSIS — I10 Essential (primary) hypertension: Secondary | ICD-10-CM | POA: Diagnosis not present

## 2016-01-07 DIAGNOSIS — K219 Gastro-esophageal reflux disease without esophagitis: Secondary | ICD-10-CM | POA: Insufficient documentation

## 2016-01-07 MED ORDER — LANSOPRAZOLE 15 MG PO CPDR: 15.0000 mg | DELAYED_RELEASE_CAPSULE | ORAL | Status: DC | PRN

## 2016-01-07 MED ORDER — ENALAPRIL MALEATE 20 MG PO TABS: ORAL_TABLET | ORAL | Status: DC

## 2016-01-07 MED ORDER — PRAVASTATIN SODIUM 80 MG PO TABS: 80.0000 mg | ORAL_TABLET | Freq: Every day | ORAL | Status: DC

## 2016-01-07 NOTE — Progress Notes (Signed)
   Subjective:    Patient ID: Jerome Sanchez, male    DOB: 1950/08/20, 65 y.o.   MRN: [id number] Patient arrives office with numerous concerns Hypertension This is a chronic problem. The current episode started more than 1 year ago. There are no compliance problems.   . Patient states no other concerns this visit. Results for orders placed or performed in visit on 07/25/15  POC Hemoccult Bld/Stl (3-Cd Home Screen)  Result Value Ref Range   Card #1 Date 07/20/2015    Fecal Occult Blood, POC Negative Negative   Card #2 Date 07/21/2015    Card #2 Fecal Occult Blod, POC Negative    Card #3 Date 07/23/2015    Card #3 Fecal Occult Blood, POC Negative    BP generally god, numb usually [id number] over 85 ndiastolic ish  Lower in the morn   Moderate exercise, walks a bit, some work around the house  prav takes qhs, compliant with cholesterol medicine. Watching diet. Old blood work reviewed. Generally does not miss a dose.  Sugar better on last b w, has tried to lose weight but not very successfully. Has cut back on simple sugars. Realizes this is an important issue.  Diet overall improved  Prn acid bolocker two timrs per wk twice per week generally does good as far as control and reflux  Done dec 2011 due again in seven yrs   Review of Systems No headache, no major weight loss or weight gain, no chest pain no back pain abdominal pain no change in bowel habits complete ROS otherwise negative     Objective:   Physical Exam   Alert obesity present vitals stable. Blood pressure good on repeat. HEENT normal. Lungs clear. Heart regular rate and rhythm. Ankles without edema     Assessment & Plan:  Impression 1 hypertension good control meds reviewed to maintain same #2 hyperlipidemia blood work reviewed to maintain same pending new blood work #3 reflux clinically stable requiring only 2 doses per week #4 impaired fasting glucose discussed as far as long-term control plan medicines refilled diet  exercise discussed blood work further recommendations based on blood work when it returns [employer]

## 2016-01-26 DIAGNOSIS — E785 Hyperlipidemia, unspecified: Secondary | ICD-10-CM | POA: Diagnosis not present

## 2016-01-26 DIAGNOSIS — R7301 Impaired fasting glucose: Secondary | ICD-10-CM | POA: Diagnosis not present

## 2016-01-27 ENCOUNTER — Encounter: Payer: Self-pay | Admitting: Family Medicine

## 2016-01-27 LAB — HEPATIC FUNCTION PANEL
ALK PHOS: 53 IU/L (ref 39–117)
ALT: 22 IU/L (ref 0–44)
AST: 24 IU/L (ref 0–40)
Albumin: 4.5 g/dL (ref 3.6–4.8)
Bilirubin Total: 0.4 mg/dL (ref 0.0–1.2)
Bilirubin, Direct: 0.12 mg/dL (ref 0.00–0.40)
Total Protein: 7.2 g/dL (ref 6.0–8.5)

## 2016-01-27 LAB — LIPID PANEL
CHOLESTEROL TOTAL: 196 mg/dL (ref 100–199)
Chol/HDL Ratio: 2.9 ratio units (ref 0.0–5.0)
HDL: 68 mg/dL (ref >39)
LDL Calculated: 114 mg/dL — ABNORMAL HIGH (ref 0–99)
Triglycerides: 71 mg/dL (ref 0–149)
VLDL CHOLESTEROL CAL: 14 mg/dL (ref 5–40)

## 2016-01-27 LAB — GLUCOSE, RANDOM: GLUCOSE: 116 mg/dL — AB (ref 65–99)

## 2016-06-12 ENCOUNTER — Telehealth: Payer: Self-pay | Admitting: Family Medicine

## 2016-06-12 DIAGNOSIS — Z125 Encounter for screening for malignant neoplasm of prostate: Secondary | ICD-10-CM

## 2016-06-12 DIAGNOSIS — Z13220 Encounter for screening for lipoid disorders: Secondary | ICD-10-CM

## 2016-06-12 DIAGNOSIS — I10 Essential (primary) hypertension: Secondary | ICD-10-CM

## 2016-06-12 DIAGNOSIS — R7301 Impaired fasting glucose: Secondary | ICD-10-CM

## 2016-06-12 NOTE — Telephone Encounter (Signed)
Patient needing lab paper having physical on 11/28.

## 2016-06-12 NOTE — Telephone Encounter (Signed)
Blood work ordered in EPIC. Patient notified. 

## 2016-06-12 NOTE — Telephone Encounter (Signed)
Lip liv m7 A1c psa 

## 2016-06-19 DIAGNOSIS — Z23 Encounter for immunization: Secondary | ICD-10-CM | POA: Diagnosis not present

## 2016-07-05 DIAGNOSIS — R7301 Impaired fasting glucose: Secondary | ICD-10-CM | POA: Diagnosis not present

## 2016-07-05 DIAGNOSIS — Z125 Encounter for screening for malignant neoplasm of prostate: Secondary | ICD-10-CM | POA: Diagnosis not present

## 2016-07-05 DIAGNOSIS — Z13220 Encounter for screening for lipoid disorders: Secondary | ICD-10-CM | POA: Diagnosis not present

## 2016-07-07 LAB — BASIC METABOLIC PANEL
BUN / CREAT RATIO: 20 (ref 10–24)
BUN: 20 mg/dL (ref 8–27)
CALCIUM: 9.5 mg/dL (ref 8.6–10.2)
CHLORIDE: 102 mmol/L (ref 96–106)
CO2: 24 mmol/L (ref 18–29)
CREATININE: 1.02 mg/dL (ref 0.76–1.27)
GFR calc Af Amer: 88 mL/min/{1.73_m2} (ref >59)
GFR calc non Af Amer: 76 mL/min/{1.73_m2} (ref >59)
GLUCOSE: 111 mg/dL — AB (ref 65–99)
Potassium: 4.8 mmol/L (ref 3.5–5.2)
Sodium: 142 mmol/L (ref 134–144)

## 2016-07-07 LAB — LIPID PANEL
CHOL/HDL RATIO: 3 ratio (ref 0.0–5.0)
Cholesterol, Total: 223 mg/dL — ABNORMAL HIGH (ref 100–199)
HDL: 74 mg/dL (ref >39)
LDL CALC: 131 mg/dL — AB (ref 0–99)
Triglycerides: 91 mg/dL (ref 0–149)
VLDL Cholesterol Cal: 18 mg/dL (ref 5–40)

## 2016-07-07 LAB — HEMOGLOBIN A1C
Est. average glucose Bld gHb Est-mCnc: 123 mg/dL
Hgb A1c MFr Bld: 5.9 % — ABNORMAL HIGH (ref 4.8–5.6)

## 2016-07-07 LAB — HEPATIC FUNCTION PANEL
ALT: 16 IU/L (ref 0–44)
AST: 18 IU/L (ref 0–40)
Albumin: 4.3 g/dL (ref 3.6–4.8)
Alkaline Phosphatase: 58 IU/L (ref 39–117)
BILIRUBIN, DIRECT: 0.15 mg/dL (ref 0.00–0.40)
Bilirubin Total: 0.6 mg/dL (ref 0.0–1.2)
TOTAL PROTEIN: 7.3 g/dL (ref 6.0–8.5)

## 2016-07-07 LAB — PSA: Prostate Specific Ag, Serum: 0.9 ng/mL (ref 0.0–4.0)

## 2016-07-09 ENCOUNTER — Encounter: Payer: Medicare Other | Admitting: Family Medicine

## 2016-07-15 ENCOUNTER — Encounter: Payer: Medicare Other | Admitting: Family Medicine

## 2016-07-15 ENCOUNTER — Ambulatory Visit (INDEPENDENT_AMBULATORY_CARE_PROVIDER_SITE_OTHER): Payer: BLUE CROSS/BLUE SHIELD | Admitting: Family Medicine

## 2016-07-15 ENCOUNTER — Encounter: Payer: Self-pay | Admitting: Family Medicine

## 2016-07-15 VITALS — BP 148/94 | Ht 67.75 in | Wt 207.0 lb

## 2016-07-15 DIAGNOSIS — I10 Essential (primary) hypertension: Secondary | ICD-10-CM

## 2016-07-15 DIAGNOSIS — Z0000 Encounter for general adult medical examination without abnormal findings: Secondary | ICD-10-CM | POA: Diagnosis not present

## 2016-07-15 DIAGNOSIS — E782 Mixed hyperlipidemia: Secondary | ICD-10-CM

## 2016-07-15 DIAGNOSIS — E7800 Pure hypercholesterolemia, unspecified: Secondary | ICD-10-CM | POA: Diagnosis not present

## 2016-07-15 DIAGNOSIS — Z23 Encounter for immunization: Secondary | ICD-10-CM

## 2016-07-15 MED ORDER — PRAVASTATIN SODIUM 80 MG PO TABS: 80.0000 mg | ORAL_TABLET | Freq: Every day | ORAL | 1 refills | Status: DC

## 2016-07-15 MED ORDER — LANSOPRAZOLE 15 MG PO CPDR: 15.0000 mg | DELAYED_RELEASE_CAPSULE | ORAL | 1 refills | Status: DC | PRN

## 2016-07-15 MED ORDER — ENALAPRIL MALEATE 20 MG PO TABS: ORAL_TABLET | ORAL | 1 refills | Status: DC

## 2016-07-15 NOTE — Progress Notes (Signed)
Subjective:    Patient ID: Jerome Sanchez, male    DOB: May 01, 1951, 65 y.o.   MRN: [id number]  HPI The patient comes in today for a wellness visit.  Results for orders placed or performed in visit on 06/12/16  Lipid panel  Result Value Ref Range   Cholesterol, Total 223 (H) 100 - 199 mg/dL   Triglycerides 91 0 - 149 mg/dL   HDL 74 >39 mg/dL   VLDL Cholesterol Cal 18 5 - 40 mg/dL   LDL Calculated 131 (H) 0 - 99 mg/dL   Chol/HDL Ratio 3.0 0.0 - 5.0 ratio units  Hepatic function panel  Result Value Ref Range   Total Protein 7.3 6.0 - 8.5 g/dL   Albumin 4.3 3.6 - 4.8 g/dL   Bilirubin Total 0.6 0.0 - 1.2 mg/dL   Bilirubin, Direct 0.15 0.00 - 0.40 mg/dL   Alkaline Phosphatase 58 39 - 117 IU/L   AST 18 0 - 40 IU/L   ALT 16 0 - 44 IU/L  Basic metabolic panel  Result Value Ref Range   Glucose 111 (H) 65 - 99 mg/dL   BUN 20 8 - 27 mg/dL   Creatinine, Ser 1.02 0.76 - 1.27 mg/dL   GFR calc non Af Amer 76 >59 mL/min/1.73   GFR calc Af Amer 88 >59 mL/min/1.73   BUN/Creatinine Ratio 20 10 - 24   Sodium 142 134 - 144 mmol/L   Potassium 4.8 3.5 - 5.2 mmol/L   Chloride 102 96 - 106 mmol/L   CO2 24 18 - 29 mmol/L   Calcium 9.5 8.6 - 10.2 mg/dL  Hemoglobin A1c  Result Value Ref Range   Hgb A1c MFr Bld 5.9 (H) 4.8 - 5.6 %   Est. average glucose Bld gHb Est-mCnc 123 mg/dL  PSA  Result Value Ref Range   Prostate Specific Ag, Serum 0.9 0.0 - 4.0 ng/mL     A review of their health history was completed.  A review of medications was also completed.  Any needed refills; update all meds  Eating habits: health conscious  Falls/  MVA accidents in past few months: none  Regular exercise: walks 2 -4 times a week  Specialist pt sees on regular basis:   Preventative health issues were discussed.   Additional concerns: none  purchsed treadmill and plans to use it   Blood pressure medicine and blood pressure levels reviewed today with patient. Compliant with blood pressure medicine.  States does not miss a dose. No obvious side effects. Blood pressure generally good when checked elsewhere. Watching salt intake.  Patient continues to take lipid medication regularly. No obvious side effects from it. Generally does not miss a dose. Prior blood work results are reviewed with patient. Patient continues to work on fat intake in diet   Review of Systems  Constitutional: Negative for activity change, appetite change and fever.  HENT: Negative for congestion and rhinorrhea.   Eyes: Negative for discharge.  Respiratory: Negative for cough and wheezing.   Cardiovascular: Negative for chest pain.  Gastrointestinal: Negative for abdominal pain, blood in stool and vomiting.  Genitourinary: Negative for difficulty urinating and frequency.  Musculoskeletal: Negative for neck pain.  Skin: Negative for rash.  Allergic/Immunologic: Negative for environmental allergies and food allergies.  Neurological: Negative for weakness and headaches.  Psychiatric/Behavioral: Negative for agitation.  All other systems reviewed and are negative.      Objective:   Physical Exam  Constitutional: He appears well-developed and well-nourished.  Obesity clearly  present elevated BMI  HENT:  Head: Normocephalic and atraumatic.  Right Ear: External ear normal.  Left Ear: External ear normal.  Nose: Nose normal.  Mouth/Throat: Oropharynx is clear and moist.  Eyes: EOM are normal. Pupils are equal, round, and reactive to light.  Neck: Normal range of motion. Neck supple. No thyromegaly present.  Cardiovascular: Normal rate, regular rhythm and normal heart sounds.   No murmur heard. Pulmonary/Chest: Effort normal and breath sounds normal. No respiratory distress. He has no wheezes.  Abdominal: Soft. Bowel sounds are normal. He exhibits no distension and no mass. There is no tenderness.  Genitourinary: Penis normal.  Musculoskeletal: Normal range of motion. He exhibits no edema.  Lymphadenopathy:     He has no cervical adenopathy.  Neurological: He is alert. He exhibits normal muscle tone.  Skin: Skin is warm and dry. No erythema.  Psychiatric: He has a normal mood and affect. His behavior is normal. Judgment normal.  Vitals reviewed.         Assessment & Plan:  Impression 1 wellness exam. Up-to-date on colonoscopy. Due in one year. #2 hypertension good control discussed maintain same meds #3 hyperlipidemia good control discussed maintain same meds plan blood work reviewed. Diet exercise reviewed pneumonia vaccine today. Exercise encourage. Recheck in 6 months

## 2016-08-24 ENCOUNTER — Ambulatory Visit (HOSPITAL_COMMUNITY)
Admission: EM | Admit: 2016-08-24 | Discharge: 2016-08-24 | Disposition: A | Discharge status: Home / Self Care | Payer: BLUE CROSS/BLUE SHIELD | Attending: Family Medicine | Admitting: Family Medicine

## 2016-08-24 ENCOUNTER — Encounter (HOSPITAL_COMMUNITY): Payer: Self-pay | Admitting: Emergency Medicine

## 2016-08-24 DIAGNOSIS — J209 Acute bronchitis, unspecified: Secondary | ICD-10-CM | POA: Diagnosis not present

## 2016-08-24 MED ORDER — PREDNISONE 20 MG PO TABS: ORAL_TABLET | ORAL | 0 refills | Status: DC

## 2016-08-24 MED ORDER — AZITHROMYCIN 250 MG PO TABS: 250.0000 mg | ORAL_TABLET | Freq: Every day | ORAL | 0 refills | Status: DC

## 2016-08-24 MED ORDER — ALBUTEROL SULFATE HFA 108 (90 BASE) MCG/ACT IN AERS: 1.0000 | INHALATION_SPRAY | Freq: Four times a day (QID) | RESPIRATORY_TRACT | 0 refills | Status: DC | PRN

## 2016-08-24 NOTE — ED Provider Notes (Signed)
CSN: [id number]     Arrival date & time 08/24/16  1210 History   First MD Initiated Contact with Patient 08/24/16 1318     Chief Complaint  Patient presents with  . URI   (Consider location/radiation/quality/duration/timing/severity/associated sxs/prior Treatment) HPI CAYSIN VALLIANT is a 66 y.o. male presenting to UC with c/o 1 week of cough, congestion, and chest tightness.  Cough was gradually improving but then worsened this morning.  He has been taking OTC cough/cold medications with minimal temporary relief.  He states he has been around others who are sick.  Denies fever, chills, n/v/d.    Past Medical History:  Diagnosis Date  . Essential hypertension, benign   . Impaired glucose tolerance   . Injury of right rotator cuff   . Lumbar disc disease   . Mixed hyperlipidemia    History reviewed. No pertinent surgical history. Family History  Problem Relation Age of Onset  . Hypertension Sister   . Diabetes type II Father    Social History  Substance Use Topics  . Smoking status: Former Smoker    Types: Cigarettes  . Smokeless tobacco: Never Used  . Alcohol use Yes     Comment: 1-2 glasses per night    Review of Systems  Constitutional: Negative for chills and fever.  HENT: Positive for congestion, postnasal drip and rhinorrhea. Negative for ear pain, sore throat, trouble swallowing and voice change.   Respiratory: Positive for cough, chest tightness and wheezing. Negative for shortness of breath.   Cardiovascular: Negative for chest pain and palpitations.  Gastrointestinal: Negative for abdominal pain, diarrhea, nausea and vomiting.  Musculoskeletal: Negative for arthralgias, back pain and myalgias.  Skin: Negative for rash.    Allergies  Penicillins  Home Medications   Prior to Admission medications   Medication Sig Start Date End Date Taking? Authorizing Provider  albuterol (PROVENTIL HFA;VENTOLIN HFA) 108 (90 Base) MCG/ACT inhaler Inhale 1-2 puffs into the  lungs every 6 (six) hours as needed for wheezing or shortness of breath. 08/24/16   Noland Fordyce, PA-C  azithromycin (ZITHROMAX) 250 MG tablet Take 1 tablet (250 mg total) by mouth daily. Take first 2 tablets together, then 1 every day until finished. 08/24/16   Noland Fordyce, PA-C  enalapril (VASOTEC) 20 MG tablet TAKE ONE TABLET BY MOUTH TWICE DAILY 07/15/16   Mikey Kirschner, MD  lansoprazole (PREVACID) 15 MG capsule Take 1 capsule (15 mg total) by mouth as needed. 07/15/16   Mikey Kirschner, MD  naproxen sodium (ANAPROX) 220 MG tablet Take 220 mg by mouth as needed.    Historical Provider, MD  OVER THE COUNTER MEDICATION Areds ( omega 3, lutein, zeaxanthin) take one a day    Historical Provider, MD  Potassium 99 MG TABS Take by mouth daily.    Historical Provider, MD  pravastatin (PRAVACHOL) 80 MG tablet Take 1 tablet (80 mg total) by mouth daily. 07/15/16   Mikey Kirschner, MD  predniSONE (DELTASONE) 20 MG tablet 3 tabs po day one, then 2 po daily x 4 days 08/24/16   Noland Fordyce, PA-C   Meds Ordered and Administered this Visit  Medications - No data to display  BP 167/99 (BP Location: Left Arm)   Pulse 97   Temp 99.4 F (37.4 C) (Oral)   Resp 20   Ht 5\' 10"  (1.778 m)   Wt 208 lb (94.3 kg)   SpO2 96%   BMI 29.84 kg/m  No data found.   Physical Exam  Constitutional:  He appears well-developed and well-nourished. No distress.  HENT:  Head: Normocephalic and atraumatic.  Right Ear: Tympanic membrane normal.  Left Ear: Tympanic membrane normal.  Nose: Mucosal edema present.  Mouth/Throat: Uvula is midline, oropharynx is clear and moist and mucous membranes are normal.  Eyes: Conjunctivae are normal. No scleral icterus.  Neck: Normal range of motion. Neck supple.  Cardiovascular: Normal rate, regular rhythm and normal heart sounds.   Pulmonary/Chest: Effort normal. No stridor. No respiratory distress. He has wheezes. He has rales. He exhibits no tenderness.  Diffuse wheeze and  coarse breath sounds w/o respiratory distress.  Abdominal: Soft. He exhibits no distension. There is no tenderness.  Musculoskeletal: Normal range of motion.  Lymphadenopathy:    He has no cervical adenopathy.  Neurological: He is alert.  Skin: Skin is warm and dry. He is not diaphoretic.  Nursing note and vitals reviewed.   Urgent Care Course   Clinical Course     Procedures (including critical care time)  Labs Review Labs Reviewed - No data to display  Imaging Review No results found.    MDM   1. Acute bronchitis, unspecified organism    Pt c/o worsening cough with chest tightness.  CXR: wheeze and rhonchi on exam.  Rx: Azithromycin, prednisone and albuterol inhaler  F/u with PCP in 1 week if not improving. Patient verbalized understanding and agreement with treatment plan.     Noland Fordyce, PA-C 08/24/16 1415

## 2016-08-24 NOTE — ED Triage Notes (Signed)
PT reports cough, congestion, chest discomfort while coughing since Tuesday.

## 2017-01-08 ENCOUNTER — Encounter: Payer: Self-pay | Admitting: Family Medicine

## 2017-01-08 ENCOUNTER — Ambulatory Visit (INDEPENDENT_AMBULATORY_CARE_PROVIDER_SITE_OTHER): Payer: BLUE CROSS/BLUE SHIELD | Admitting: Family Medicine

## 2017-01-08 VITALS — BP 142/90 | Ht 67.75 in | Wt 207.5 lb

## 2017-01-08 DIAGNOSIS — R7303 Prediabetes: Secondary | ICD-10-CM | POA: Diagnosis not present

## 2017-01-08 DIAGNOSIS — E7800 Pure hypercholesterolemia, unspecified: Secondary | ICD-10-CM | POA: Diagnosis not present

## 2017-01-08 DIAGNOSIS — I10 Essential (primary) hypertension: Secondary | ICD-10-CM | POA: Diagnosis not present

## 2017-01-08 DIAGNOSIS — K219 Gastro-esophageal reflux disease without esophagitis: Secondary | ICD-10-CM

## 2017-01-08 DIAGNOSIS — J301 Allergic rhinitis due to pollen: Secondary | ICD-10-CM

## 2017-01-08 MED ORDER — LANSOPRAZOLE 15 MG PO CPDR: 15.0000 mg | DELAYED_RELEASE_CAPSULE | ORAL | 1 refills | Status: DC | PRN

## 2017-01-08 MED ORDER — ENALAPRIL MALEATE 20 MG PO TABS: ORAL_TABLET | ORAL | 1 refills | Status: DC

## 2017-01-08 MED ORDER — PRAVASTATIN SODIUM 80 MG PO TABS: 80.0000 mg | ORAL_TABLET | Freq: Every day | ORAL | 1 refills | Status: DC

## 2017-01-08 NOTE — Progress Notes (Signed)
Subjective:    Patient ID: [name], male    DOB: 10/28/1950, 66 y.o.   MRN: [id number] Patient arrives office with numerous concerns Hypertension  This is a chronic problem. The current episode started more than 1 year ago.   Results for orders placed or performed in visit on 06/12/16  Lipid panel  Result Value Ref Range   Cholesterol, Total 223 (H) 100 - 199 mg/dL   Triglycerides 91 0 - 149 mg/dL   HDL 74 >39 mg/dL   VLDL Cholesterol Cal 18 5 - 40 mg/dL   LDL Calculated 131 (H) 0 - 99 mg/dL   Chol/HDL Ratio 3.0 0.0 - 5.0 ratio units  Hepatic function panel  Result Value Ref Range   Total Protein 7.3 6.0 - 8.5 g/dL   Albumin 4.3 3.6 - 4.8 g/dL   Bilirubin Total 0.6 0.0 - 1.2 mg/dL   Bilirubin, Direct 0.15 0.00 - 0.40 mg/dL   Alkaline Phosphatase 58 39 - 117 IU/L   AST 18 0 - 40 IU/L   ALT 16 0 - 44 IU/L  Basic metabolic panel  Result Value Ref Range   Glucose 111 (H) 65 - 99 mg/dL   BUN 20 8 - 27 mg/dL   Creatinine, Ser 1.02 0.76 - 1.27 mg/dL   GFR calc non Af Amer 76 >59 mL/min/1.73   GFR calc Af Amer 88 >59 mL/min/1.73   BUN/Creatinine Ratio 20 10 - 24   Sodium 142 134 - 144 mmol/L   Potassium 4.8 3.5 - 5.2 mmol/L   Chloride 102 96 - 106 mmol/L   CO2 24 18 - 29 mmol/L   Calcium 9.5 8.6 - 10.2 mg/dL  Hemoglobin A1c  Result Value Ref Range   Hgb A1c MFr Bld 5.9 (H) 4.8 - 5.6 %   Est. average glucose Bld gHb Est-mCnc 123 mg/dL  PSA  Result Value Ref Range   Prostate Specific Ag, Serum 0.9 0.0 - 4.0 ng/mL   Blood pressure medicine and blood pressure levels reviewed today with patient. Compliant with blood pressure medicine. States does not miss a dose. No obvious side effects. Blood pressure generally good when checked elsewhere. Watching salt intake.  Patient continues to take lipid medication regularly. No obvious side effects from it. Generally does not miss a dose. Prior blood work results are reviewed with patient. Patient continues to work on fat intake in  diet   Known prediabetes. Trying to watch carb intake. Trying to lose weight. Current status uncertain   Exercise overall good  Working on the die has cit back on sugars  Hx of copd , breathing stable  Uses reflux meds only two or three ties per wk   Some allergy symtoms this spring  Patient states no other concerns this visit.  Review of Systems No headache, no major weight loss or weight gain, no chest pain no back pain abdominal pain no change in bowel habits complete ROS otherwise negative     Objective:   Physical Exam   Alert and oriented, vitals reviewed and stable, NAD ENT-TM's and ext canals WNL bilat via otoscopic exam Soft palate, tonsils and post pharynx WNL via oropharyngeal exam Neck-symmetric, no masses; thyroid nonpalpable and nontender Pulmonary-no tachypnea or accessory muscle use; Clear without wheezes via auscultation Card--no abnrml murmurs, rhythm reg and rate WNL Carotid pulses symmetric, without bruits      Assessment & Plan:  Impression 1 hypertension good control discussed maintain same meds #2 hyperlipidemia prior blood work discussed  reviewed to maintain same meds pending new blood work #3 prediabetes discussed with goals discussed #4 COPD fairly stable occasional use an inhaler importance of exercise discussed plan appropriate blood work meds refilled follow-up in 6 months for wellness plus chronic

## 2017-01-10 DIAGNOSIS — E7800 Pure hypercholesterolemia, unspecified: Secondary | ICD-10-CM | POA: Diagnosis not present

## 2017-01-10 DIAGNOSIS — R7303 Prediabetes: Secondary | ICD-10-CM | POA: Diagnosis not present

## 2017-01-11 LAB — LIPID PANEL
CHOLESTEROL TOTAL: 235 mg/dL — AB (ref 100–199)
Chol/HDL Ratio: 3.3 ratio (ref 0.0–5.0)
HDL: 71 mg/dL (ref >39)
LDL Calculated: 145 mg/dL — ABNORMAL HIGH (ref 0–99)
Triglycerides: 93 mg/dL (ref 0–149)
VLDL CHOLESTEROL CAL: 19 mg/dL (ref 5–40)

## 2017-01-11 LAB — HEPATIC FUNCTION PANEL
ALT: 18 IU/L (ref 0–44)
AST: 22 IU/L (ref 0–40)
Albumin: 4.6 g/dL (ref 3.6–4.8)
Alkaline Phosphatase: 57 IU/L (ref 39–117)
BILIRUBIN TOTAL: 0.5 mg/dL (ref 0.0–1.2)
BILIRUBIN, DIRECT: 0.15 mg/dL (ref 0.00–0.40)
Total Protein: 7.3 g/dL (ref 6.0–8.5)

## 2017-01-11 LAB — HEMOGLOBIN A1C
Est. average glucose Bld gHb Est-mCnc: 128 mg/dL
HEMOGLOBIN A1C: 6.1 % — AB (ref 4.8–5.6)

## 2017-01-13 ENCOUNTER — Telehealth: Payer: Self-pay | Admitting: *Deleted

## 2017-01-13 MED ORDER — ATORVASTATIN CALCIUM 80 MG PO TABS: 80.0000 mg | ORAL_TABLET | Freq: Every day | ORAL | 0 refills | Status: DC

## 2017-01-13 NOTE — Telephone Encounter (Signed)
Notes recorded by Mikey Kirschner, MD on 01/13/2017 at 1:00 PM EDT Call pt, ldl simply in unsatisfactory control, rec changing from pravochol to generic Lipitor, this should improve LDL control and lower risk of heart attack and stroke. 6 months worth

## 2017-01-13 NOTE — Addendum Note (Signed)
Addended by: Dairl Ponder on: 01/13/2017 03:15 PM   Modules accepted: Orders

## 2017-01-13 NOTE — Telephone Encounter (Signed)
Results discussed with patient. Patient advised LDL cholesterol is simply in unsatisfactory control, recommend  changing from pravochol to generic Lipitor 80mg  daily , this should improve LDL control and lower risk of heart attack and stroke. 6 months worth. Patient agreed and verbalized understanding. Prescription sent electronically to pharmacy.

## 2017-04-04 ENCOUNTER — Other Ambulatory Visit: Payer: Self-pay | Admitting: Family Medicine

## 2017-04-06 ENCOUNTER — Other Ambulatory Visit: Payer: Self-pay | Admitting: Family Medicine

## 2017-06-09 DIAGNOSIS — Z23 Encounter for immunization: Secondary | ICD-10-CM | POA: Diagnosis not present

## 2017-07-02 ENCOUNTER — Encounter: Payer: Self-pay | Admitting: Internal Medicine

## 2017-07-06 ENCOUNTER — Telehealth: Payer: Self-pay | Admitting: Family Medicine

## 2017-07-06 NOTE — Telephone Encounter (Signed)
Patient had  Lipid, Liver and Hgb A1c  12/2016

## 2017-07-06 NOTE — Telephone Encounter (Signed)
Patient has an appointment on 07/16/17 with Dr. Richardson Landry.  He wants to know if he needs labs?

## 2017-07-12 ENCOUNTER — Encounter: Payer: Self-pay | Admitting: Family Medicine

## 2017-07-13 ENCOUNTER — Other Ambulatory Visit: Payer: Self-pay | Admitting: *Deleted

## 2017-07-13 DIAGNOSIS — Z79899 Other long term (current) drug therapy: Secondary | ICD-10-CM

## 2017-07-13 DIAGNOSIS — Z125 Encounter for screening for malignant neoplasm of prostate: Secondary | ICD-10-CM

## 2017-07-13 DIAGNOSIS — I10 Essential (primary) hypertension: Secondary | ICD-10-CM

## 2017-07-13 DIAGNOSIS — E119 Type 2 diabetes mellitus without complications: Secondary | ICD-10-CM

## 2017-07-13 DIAGNOSIS — E785 Hyperlipidemia, unspecified: Secondary | ICD-10-CM

## 2017-07-14 DIAGNOSIS — H35373 Puckering of macula, bilateral: Secondary | ICD-10-CM | POA: Diagnosis not present

## 2017-07-14 DIAGNOSIS — H26492 Other secondary cataract, left eye: Secondary | ICD-10-CM | POA: Diagnosis not present

## 2017-07-15 ENCOUNTER — Ambulatory Visit: Payer: Medicare Other | Admitting: Family Medicine

## 2017-07-16 ENCOUNTER — Ambulatory Visit (HOSPITAL_COMMUNITY)
Admission: RE | Admit: 2017-07-16 | Discharge: 2017-07-16 | Disposition: A | Discharge status: Home / Self Care | Payer: BLUE CROSS/BLUE SHIELD | Source: Ambulatory Visit | Attending: Family Medicine | Admitting: Family Medicine

## 2017-07-16 ENCOUNTER — Ambulatory Visit (INDEPENDENT_AMBULATORY_CARE_PROVIDER_SITE_OTHER): Payer: BLUE CROSS/BLUE SHIELD | Admitting: Family Medicine

## 2017-07-16 ENCOUNTER — Encounter: Payer: Self-pay | Admitting: Family Medicine

## 2017-07-16 VITALS — BP 148/98 | Ht 67.75 in | Wt 204.0 lb

## 2017-07-16 DIAGNOSIS — Z79899 Other long term (current) drug therapy: Secondary | ICD-10-CM | POA: Diagnosis not present

## 2017-07-16 DIAGNOSIS — R918 Other nonspecific abnormal finding of lung field: Secondary | ICD-10-CM | POA: Diagnosis not present

## 2017-07-16 DIAGNOSIS — R05 Cough: Secondary | ICD-10-CM | POA: Insufficient documentation

## 2017-07-16 DIAGNOSIS — Z0000 Encounter for general adult medical examination without abnormal findings: Secondary | ICD-10-CM

## 2017-07-16 DIAGNOSIS — N400 Enlarged prostate without lower urinary tract symptoms: Secondary | ICD-10-CM | POA: Diagnosis not present

## 2017-07-16 DIAGNOSIS — K219 Gastro-esophageal reflux disease without esophagitis: Secondary | ICD-10-CM

## 2017-07-16 DIAGNOSIS — E782 Mixed hyperlipidemia: Secondary | ICD-10-CM | POA: Diagnosis not present

## 2017-07-16 DIAGNOSIS — Z0001 Encounter for general adult medical examination with abnormal findings: Secondary | ICD-10-CM | POA: Diagnosis not present

## 2017-07-16 DIAGNOSIS — J441 Chronic obstructive pulmonary disease with (acute) exacerbation: Secondary | ICD-10-CM | POA: Diagnosis not present

## 2017-07-16 DIAGNOSIS — R7301 Impaired fasting glucose: Secondary | ICD-10-CM | POA: Diagnosis not present

## 2017-07-16 DIAGNOSIS — Z125 Encounter for screening for malignant neoplasm of prostate: Secondary | ICD-10-CM | POA: Diagnosis not present

## 2017-07-16 DIAGNOSIS — I10 Essential (primary) hypertension: Secondary | ICD-10-CM | POA: Diagnosis not present

## 2017-07-16 DIAGNOSIS — E119 Type 2 diabetes mellitus without complications: Secondary | ICD-10-CM | POA: Diagnosis not present

## 2017-07-16 DIAGNOSIS — E785 Hyperlipidemia, unspecified: Secondary | ICD-10-CM | POA: Diagnosis not present

## 2017-07-16 DIAGNOSIS — R053 Chronic cough: Secondary | ICD-10-CM

## 2017-07-16 MED ORDER — DOXYCYCLINE HYCLATE 100 MG PO TABS: 100.0000 mg | ORAL_TABLET | Freq: Two times a day (BID) | ORAL | 0 refills | Status: DC

## 2017-07-16 MED ORDER — ENALAPRIL MALEATE 20 MG PO TABS: ORAL_TABLET | ORAL | 1 refills | Status: DC

## 2017-07-16 MED ORDER — ATORVASTATIN CALCIUM 80 MG PO TABS: 80.0000 mg | ORAL_TABLET | Freq: Every day | ORAL | 1 refills | Status: DC

## 2017-07-16 MED ORDER — TAMSULOSIN HCL 0.4 MG PO CAPS: ORAL_CAPSULE | ORAL | 1 refills | Status: DC

## 2017-07-16 NOTE — Progress Notes (Signed)
Subjective:    Patient ID: [name], male    DOB: 12/28/1950, 66 y.o.   MRN: [id number]  HPI The patient comes in today for a wellness visit.    A review of their health history was completed.  A review of medications was also completed.  Any needed refills; No  Eating habits: Good  Falls/  MVA accidents in past few months: No  Regular exercise: Yes 3-4 times a week  Specialist pt sees on regular basis: None  Preventative health issues were discussed.   Additional concerns: nagging productive cough, for the last two months.  Results for orders placed or performed in visit on 01/08/17  Hepatic function panel  Result Value Ref Range   Total Protein 7.3 6.0 - 8.5 g/dL   Albumin 4.6 3.6 - 4.8 g/dL   Bilirubin Total 0.5 0.0 - 1.2 mg/dL   Bilirubin, Direct 0.15 0.00 - 0.40 mg/dL   Alkaline Phosphatase 57 39 - 117 IU/L   AST 22 0 - 40 IU/L   ALT 18 0 - 44 IU/L  Lipid panel  Result Value Ref Range   Cholesterol, Total 235 (H) 100 - 199 mg/dL   Triglycerides 93 0 - 149 mg/dL   HDL 71 >39 mg/dL   VLDL Cholesterol Cal 19 5 - 40 mg/dL   LDL Calculated 145 (H) 0 - 99 mg/dL   Chol/HDL Ratio 3.3 0.0 - 5.0 ratio  Hemoglobin A1C  Result Value Ref Range   Hgb A1c MFr Bld 6.1 (H) 4.8 - 5.6 %   Est. average glucose Bld gHb Est-mCnc 128 mg/dL   Blood pressure medicine and blood pressure levels reviewed today with patient. Compliant with blood pressure medicine. States does not miss a dose. No obvious side effects. Blood pressure generally good when checked elsewhere. Watching salt intake.   Patient continues to take lipid medication regularly. No obvious side effects from it. Generally does not miss a dose. Prior blood work results are reviewed with patient. Patient continues to work on fat intake in diet  Cough is aggravating, not horrible, gets up phlegm, coughs it up , worse in the morn. Also at night. incr wheezing     Review of Systems  Constitutional: Negative for  activity change, appetite change and fever.  HENT: Negative for congestion and rhinorrhea.   Eyes: Negative for discharge.  Respiratory: Negative for cough and wheezing.   Cardiovascular: Negative for chest pain.  Gastrointestinal: Negative for abdominal pain, blood in stool and vomiting.  Genitourinary: Negative for difficulty urinating and frequency.  Musculoskeletal: Negative for neck pain.  Skin: Negative for rash.  Allergic/Immunologic: Negative for environmental allergies and food allergies.  Neurological: Negative for weakness and headaches.  Psychiatric/Behavioral: Negative for agitation.  All other systems reviewed and are negative.      Objective:   Physical Exam  Constitutional: He appears well-developed and well-nourished.  HENT:  Head: Normocephalic and atraumatic.  Right Ear: External ear normal.  Left Ear: External ear normal.  Nose: Nose normal.  Mouth/Throat: Oropharynx is clear and moist.  Eyes: EOM are normal. Pupils are equal, round, and reactive to light.  Neck: Normal range of motion. Neck supple. No thyromegaly present.  Cardiovascular: Normal rate, regular rhythm and normal heart sounds.  No murmur heard. Pulmonary/Chest: Effort normal and breath sounds normal. No respiratory distress. He has no wheezes.  Abdominal: Soft. Bowel sounds are normal. He exhibits no distension and no mass. There is no tenderness.  Genitourinary: Penis normal.  Musculoskeletal: Normal range of motion. He exhibits no edema.  Lymphadenopathy:    He has no cervical adenopathy.  Neurological: He is alert. He exhibits normal muscle tone.  Skin: Skin is warm and dry. No erythema.  Psychiatric: He has a normal mood and affect. His behavior is normal. Judgment normal.  Vitals reviewed.         Assessment & Plan:  Impression 1 wellness exam.  Getting colonoscopy shortly.  Exercise has dropped off a bit.  Trying to watch his diet.  2.  Hypertension blood pressure good on repeat  to maintain same levels  3.  Hyperlipidemia status uncertain.  Need to check levels blood work pending  4.  Prostate hypertrophy.  Now very symptomatic of 3-4 times each night.  Will initiate Flomax.  Substantial discussion regarding side effects benefits  5.  Subacute cough 2 months duration with known history of smoking some aggravation of wheezing positive productive times will plan antibiotics and chest x-ray to be on the safe side

## 2017-07-17 ENCOUNTER — Encounter: Payer: Self-pay | Admitting: Family Medicine

## 2017-07-17 LAB — LIPID PANEL
CHOL/HDL RATIO: 2.4 ratio (ref 0.0–5.0)
CHOLESTEROL TOTAL: 195 mg/dL (ref 100–199)
HDL: 80 mg/dL (ref >39)
LDL Calculated: 100 mg/dL — ABNORMAL HIGH (ref 0–99)
TRIGLYCERIDES: 75 mg/dL (ref 0–149)
VLDL Cholesterol Cal: 15 mg/dL (ref 5–40)

## 2017-07-17 LAB — BASIC METABOLIC PANEL
BUN / CREAT RATIO: 23 (ref 10–24)
BUN: 23 mg/dL (ref 8–27)
CALCIUM: 9.6 mg/dL (ref 8.6–10.2)
CHLORIDE: 104 mmol/L (ref 96–106)
CO2: 27 mmol/L (ref 20–29)
Creatinine, Ser: 0.99 mg/dL (ref 0.76–1.27)
GFR calc Af Amer: 91 mL/min/{1.73_m2} (ref >59)
GFR calc non Af Amer: 78 mL/min/{1.73_m2} (ref >59)
GLUCOSE: 120 mg/dL — AB (ref 65–99)
POTASSIUM: 5.1 mmol/L (ref 3.5–5.2)
Sodium: 142 mmol/L (ref 134–144)

## 2017-07-17 LAB — HEPATIC FUNCTION PANEL
ALBUMIN: 4.9 g/dL — AB (ref 3.6–4.8)
ALT: 39 IU/L (ref 0–44)
AST: 31 IU/L (ref 0–40)
Alkaline Phosphatase: 96 IU/L (ref 39–117)
Bilirubin Total: 0.4 mg/dL (ref 0.0–1.2)
Bilirubin, Direct: 0.15 mg/dL (ref 0.00–0.40)
TOTAL PROTEIN: 7.7 g/dL (ref 6.0–8.5)

## 2017-07-17 LAB — HEMOGLOBIN A1C
Est. average glucose Bld gHb Est-mCnc: 126 mg/dL
HEMOGLOBIN A1C: 6 % — AB (ref 4.8–5.6)

## 2017-07-17 LAB — PSA: PROSTATE SPECIFIC AG, SERUM: 1 ng/mL (ref 0.0–4.0)

## 2017-12-30 ENCOUNTER — Telehealth: Payer: Self-pay | Admitting: Family Medicine

## 2017-12-30 NOTE — Telephone Encounter (Signed)
Last labs 07/16/17 lipid, liver, a1c, bmp, psa

## 2017-12-30 NOTE — Telephone Encounter (Signed)
Pt has 6 month follow up on 01/14/18 - needs lab work ordered  Please call pt when done

## 2017-12-31 NOTE — Telephone Encounter (Signed)
Lip liv A1c 

## 2018-01-01 ENCOUNTER — Other Ambulatory Visit: Payer: Self-pay | Admitting: Family Medicine

## 2018-01-01 DIAGNOSIS — E782 Mixed hyperlipidemia: Secondary | ICD-10-CM

## 2018-01-01 DIAGNOSIS — Z79899 Other long term (current) drug therapy: Secondary | ICD-10-CM

## 2018-01-01 DIAGNOSIS — R7301 Impaired fasting glucose: Secondary | ICD-10-CM

## 2018-01-01 NOTE — Telephone Encounter (Signed)
Left message to return call 

## 2018-01-05 NOTE — Telephone Encounter (Signed)
I called and left a message asked that he r/c. 

## 2018-01-06 NOTE — Telephone Encounter (Signed)
Patient is aware 

## 2018-01-12 DIAGNOSIS — R7301 Impaired fasting glucose: Secondary | ICD-10-CM | POA: Diagnosis not present

## 2018-01-12 DIAGNOSIS — E782 Mixed hyperlipidemia: Secondary | ICD-10-CM | POA: Diagnosis not present

## 2018-01-12 DIAGNOSIS — Z79899 Other long term (current) drug therapy: Secondary | ICD-10-CM | POA: Diagnosis not present

## 2018-01-13 ENCOUNTER — Ambulatory Visit: Payer: Medicare Other | Admitting: Family Medicine

## 2018-01-13 LAB — HEPATIC FUNCTION PANEL
ALK PHOS: 76 IU/L (ref 39–117)
ALT: 33 IU/L (ref 0–44)
AST: 35 IU/L (ref 0–40)
Albumin: 4.6 g/dL (ref 3.6–4.8)
BILIRUBIN, DIRECT: 0.21 mg/dL (ref 0.00–0.40)
Bilirubin Total: 0.6 mg/dL (ref 0.0–1.2)
Total Protein: 7.2 g/dL (ref 6.0–8.5)

## 2018-01-13 LAB — HEMOGLOBIN A1C
Est. average glucose Bld gHb Est-mCnc: 123 mg/dL
Hgb A1c MFr Bld: 5.9 % — ABNORMAL HIGH (ref 4.8–5.6)

## 2018-01-13 LAB — LIPID PANEL
CHOLESTEROL TOTAL: 183 mg/dL (ref 100–199)
Chol/HDL Ratio: 2.2 ratio (ref 0.0–5.0)
HDL: 84 mg/dL (ref >39)
LDL Calculated: 86 mg/dL (ref 0–99)
TRIGLYCERIDES: 67 mg/dL (ref 0–149)
VLDL Cholesterol Cal: 13 mg/dL (ref 5–40)

## 2018-01-14 ENCOUNTER — Encounter: Payer: Self-pay | Admitting: Family Medicine

## 2018-01-14 ENCOUNTER — Ambulatory Visit (INDEPENDENT_AMBULATORY_CARE_PROVIDER_SITE_OTHER): Payer: BLUE CROSS/BLUE SHIELD | Admitting: Family Medicine

## 2018-01-14 VITALS — BP 140/78 | Ht 67.75 in | Wt 205.6 lb

## 2018-01-14 DIAGNOSIS — R7301 Impaired fasting glucose: Secondary | ICD-10-CM | POA: Diagnosis not present

## 2018-01-14 DIAGNOSIS — M25512 Pain in left shoulder: Secondary | ICD-10-CM | POA: Diagnosis not present

## 2018-01-14 DIAGNOSIS — E782 Mixed hyperlipidemia: Secondary | ICD-10-CM

## 2018-01-14 DIAGNOSIS — N400 Enlarged prostate without lower urinary tract symptoms: Secondary | ICD-10-CM | POA: Diagnosis not present

## 2018-01-14 DIAGNOSIS — I10 Essential (primary) hypertension: Secondary | ICD-10-CM | POA: Diagnosis not present

## 2018-01-14 MED ORDER — LANSOPRAZOLE 15 MG PO CPDR: 15.0000 mg | DELAYED_RELEASE_CAPSULE | ORAL | 1 refills | Status: DC | PRN

## 2018-01-14 MED ORDER — ATORVASTATIN CALCIUM 80 MG PO TABS: 80.0000 mg | ORAL_TABLET | Freq: Every day | ORAL | 1 refills | Status: DC

## 2018-01-14 MED ORDER — ENALAPRIL MALEATE 20 MG PO TABS: ORAL_TABLET | ORAL | 1 refills | Status: DC

## 2018-01-14 MED ORDER — METHYLPREDNISOLONE ACETATE 40 MG/ML IJ SUSP: 40.0000 mg | Freq: Once | INTRAMUSCULAR | Status: DC

## 2018-01-14 MED ORDER — TAMSULOSIN HCL 0.4 MG PO CAPS: ORAL_CAPSULE | ORAL | 1 refills | Status: DC

## 2018-01-14 NOTE — Progress Notes (Signed)
   Subjective:    Patient ID: Jerome Sanchez, male    DOB: [birth year], 67 y.o.   MRN: [id number]  HPI Pt here today for 6 month follow up. HTN, hyperlipidemia, GERD.   Shoulders are painful, left shoulder hurts more. Right arm aches. Shoulder injury 15 years ago. Pt states "it hurts from ear to elbow".   Exercise doing overall better  Blood pressure medicine and blood pressure levels reviewed today with patient. Compliant with blood pressure medicine. States does not miss a dose. No obvious side effects. Blood pressure generally good when checked elsewhere. Watching salt intake.   Patient continues to take lipid medication regularly. No obvious side effects from it. Generally does not miss a dose. Prior blood work results are reviewed with patient. Patient continues to work on fat intake in diet  Prediabetes/patient states working on sugars try to cut down sugar intake.  No major shortness of breath.  Long history of smoking.  Known COPD.  bp not cking reg,   Tingling sensation, no numbness.   Results for orders placed or performed in visit on 01/01/18  HgB A1c  Result Value Ref Range   Hgb A1c MFr Bld 5.9 (H) 4.8 - 5.6 %   Est. average glucose Bld gHb Est-mCnc 123 mg/dL  Hepatic function panel  Result Value Ref Range   Total Protein 7.2 6.0 - 8.5 g/dL   Albumin 4.6 3.6 - 4.8 g/dL   Bilirubin Total 0.6 0.0 - 1.2 mg/dL   Bilirubin, Direct 0.21 0.00 - 0.40 mg/dL   Alkaline Phosphatase 76 39 - 117 IU/L   AST 35 0 - 40 IU/L   ALT 33 0 - 44 IU/L  Lipid Profile  Result Value Ref Range   Cholesterol, Total 183 100 - 199 mg/dL   Triglycerides 67 0 - 149 mg/dL   HDL 84 >39 mg/dL   VLDL Cholesterol Cal 13 5 - 40 mg/dL   LDL Calculated 86 0 - 99 mg/dL   Chol/HDL Ratio 2.2 0.0 - 5.0 ratio   Left shoulder severely painful for six months  Right should somewhat painful but not as bad  Has not had to see an ortho doc any time recently    Review of Systems No headache, no major  weight loss or weight gain, no chest pain no back pain abdominal pain no change in bowel habits complete ROS otherwise negative     Objective:   Physical Exam  Alert and oriented, vitals reviewed and stable, NAD ENT-TM's and ext canals WNL bilat via otoscopic exam Soft palate, tonsils and post pharynx WNL via oropharyngeal exam Neck-symmetric, no masses; thyroid nonpalpable and nontender Pulmonary-no tachypnea or accessory muscle use; Clear without wheezes via auscultation Card--no abnrml murmurs, rhythm reg and rate WNL Carotid pulses symmetric, without bruits Left shoulder positive impingement sign.  Positive difficulty with extension.  Substantial pain with range of motion.  Patient was prepped draped injected with 1 cc Depo-Medrol 2 cc Xylocaine posterior lateral approach left shoulder.        Assessment & Plan:  #1 impression hypertension.  Good control discussed compliance discussed maintain same meds  2.  Hyperlipidemia blood work reviewed to maintain same meds rationale discussed compliance discussed  3.  COPD.  Occasional use albuterol strong exercise strongly encouraged will help  4.  Prediabetes.  Stable.  A1c 5.9 control discussed  5.  Left shoulder impingement.  Hopefully injection will help discussed

## 2018-01-19 ENCOUNTER — Ambulatory Visit (HOSPITAL_COMMUNITY)
Admission: RE | Admit: 2018-01-19 | Discharge: 2018-01-19 | Disposition: A | Discharge status: Home / Self Care | Payer: BLUE CROSS/BLUE SHIELD | Source: Ambulatory Visit | Attending: Family Medicine | Admitting: Family Medicine

## 2018-01-19 DIAGNOSIS — M25512 Pain in left shoulder: Secondary | ICD-10-CM | POA: Insufficient documentation

## 2018-06-23 ENCOUNTER — Other Ambulatory Visit: Payer: Self-pay | Admitting: Family Medicine

## 2018-06-23 ENCOUNTER — Telehealth: Payer: Self-pay | Admitting: Family Medicine

## 2018-06-23 DIAGNOSIS — E119 Type 2 diabetes mellitus without complications: Secondary | ICD-10-CM

## 2018-06-23 DIAGNOSIS — I10 Essential (primary) hypertension: Secondary | ICD-10-CM

## 2018-06-23 DIAGNOSIS — R7303 Prediabetes: Secondary | ICD-10-CM

## 2018-06-23 DIAGNOSIS — E782 Mixed hyperlipidemia: Secondary | ICD-10-CM

## 2018-06-23 DIAGNOSIS — Z125 Encounter for screening for malignant neoplasm of prostate: Secondary | ICD-10-CM

## 2018-06-23 DIAGNOSIS — Z79899 Other long term (current) drug therapy: Secondary | ICD-10-CM

## 2018-06-23 NOTE — Telephone Encounter (Signed)
Last labs on 01/12/18 Lipid, Hepatic, and A1C

## 2018-06-23 NOTE — Telephone Encounter (Signed)
Lip liv m7 A1c psa

## 2018-06-23 NOTE — Telephone Encounter (Signed)
Pt is scheduled for wellness exam 07/27/18 w/ Dr.Steve, requesting blood work, advise.

## 2018-06-23 NOTE — Telephone Encounter (Signed)
Labs ordered and pt is aware 

## 2018-06-27 ENCOUNTER — Other Ambulatory Visit: Payer: Self-pay | Admitting: Family Medicine

## 2018-07-03 DIAGNOSIS — E782 Mixed hyperlipidemia: Secondary | ICD-10-CM | POA: Diagnosis not present

## 2018-07-03 DIAGNOSIS — E119 Type 2 diabetes mellitus without complications: Secondary | ICD-10-CM | POA: Diagnosis not present

## 2018-07-03 DIAGNOSIS — I10 Essential (primary) hypertension: Secondary | ICD-10-CM | POA: Diagnosis not present

## 2018-07-03 DIAGNOSIS — Z79899 Other long term (current) drug therapy: Secondary | ICD-10-CM | POA: Diagnosis not present

## 2018-07-03 DIAGNOSIS — Z125 Encounter for screening for malignant neoplasm of prostate: Secondary | ICD-10-CM | POA: Diagnosis not present

## 2018-07-04 LAB — HEPATIC FUNCTION PANEL
ALBUMIN: 4.4 g/dL (ref 3.6–4.8)
ALT: 26 IU/L (ref 0–44)
AST: 24 IU/L (ref 0–40)
Alkaline Phosphatase: 78 IU/L (ref 39–117)
BILIRUBIN TOTAL: 0.3 mg/dL (ref 0.0–1.2)
Bilirubin, Direct: 0.11 mg/dL (ref 0.00–0.40)
Total Protein: 6.8 g/dL (ref 6.0–8.5)

## 2018-07-04 LAB — LIPID PANEL
CHOL/HDL RATIO: 2.3 ratio (ref 0.0–5.0)
Cholesterol, Total: 178 mg/dL (ref 100–199)
HDL: 76 mg/dL (ref >39)
LDL Calculated: 92 mg/dL (ref 0–99)
Triglycerides: 52 mg/dL (ref 0–149)
VLDL CHOLESTEROL CAL: 10 mg/dL (ref 5–40)

## 2018-07-04 LAB — BASIC METABOLIC PANEL
BUN/Creatinine Ratio: 17 (ref 10–24)
BUN: 18 mg/dL (ref 8–27)
CALCIUM: 9.4 mg/dL (ref 8.6–10.2)
CHLORIDE: 103 mmol/L (ref 96–106)
CO2: 23 mmol/L (ref 20–29)
Creatinine, Ser: 1.07 mg/dL (ref 0.76–1.27)
GFR calc Af Amer: 82 mL/min/{1.73_m2} (ref >59)
GFR calc non Af Amer: 71 mL/min/{1.73_m2} (ref >59)
GLUCOSE: 108 mg/dL — AB (ref 65–99)
POTASSIUM: 4.8 mmol/L (ref 3.5–5.2)
SODIUM: 142 mmol/L (ref 134–144)

## 2018-07-04 LAB — PSA: PROSTATE SPECIFIC AG, SERUM: 1.1 ng/mL (ref 0.0–4.0)

## 2018-07-04 LAB — HEMOGLOBIN A1C
ESTIMATED AVERAGE GLUCOSE: 126 mg/dL
HEMOGLOBIN A1C: 6 % — AB (ref 4.8–5.6)

## 2018-07-19 ENCOUNTER — Encounter: Payer: Medicare Other | Admitting: Family Medicine

## 2018-07-27 ENCOUNTER — Ambulatory Visit (INDEPENDENT_AMBULATORY_CARE_PROVIDER_SITE_OTHER): Payer: BLUE CROSS/BLUE SHIELD | Admitting: Family Medicine

## 2018-07-27 ENCOUNTER — Encounter: Payer: Self-pay | Admitting: Family Medicine

## 2018-07-27 VITALS — BP 136/90 | Ht 67.75 in | Wt 201.8 lb

## 2018-07-27 DIAGNOSIS — J449 Chronic obstructive pulmonary disease, unspecified: Secondary | ICD-10-CM | POA: Diagnosis not present

## 2018-07-27 DIAGNOSIS — Z23 Encounter for immunization: Secondary | ICD-10-CM

## 2018-07-27 DIAGNOSIS — E782 Mixed hyperlipidemia: Secondary | ICD-10-CM | POA: Diagnosis not present

## 2018-07-27 DIAGNOSIS — Z0000 Encounter for general adult medical examination without abnormal findings: Secondary | ICD-10-CM | POA: Diagnosis not present

## 2018-07-27 DIAGNOSIS — I10 Essential (primary) hypertension: Secondary | ICD-10-CM

## 2018-07-27 DIAGNOSIS — R7303 Prediabetes: Secondary | ICD-10-CM

## 2018-07-27 DIAGNOSIS — R7301 Impaired fasting glucose: Secondary | ICD-10-CM

## 2018-07-27 DIAGNOSIS — J4489 Other specified chronic obstructive pulmonary disease: Secondary | ICD-10-CM

## 2018-07-27 DIAGNOSIS — N400 Enlarged prostate without lower urinary tract symptoms: Secondary | ICD-10-CM

## 2018-07-27 MED ORDER — ENALAPRIL MALEATE 20 MG PO TABS: ORAL_TABLET | ORAL | 5 refills | Status: DC

## 2018-07-27 MED ORDER — ATORVASTATIN CALCIUM 80 MG PO TABS: 80.0000 mg | ORAL_TABLET | Freq: Every day | ORAL | 1 refills | Status: DC

## 2018-07-27 MED ORDER — ALBUTEROL SULFATE HFA 108 (90 BASE) MCG/ACT IN AERS: 1.0000 | INHALATION_SPRAY | Freq: Four times a day (QID) | RESPIRATORY_TRACT | 0 refills | Status: DC | PRN

## 2018-07-27 MED ORDER — LANSOPRAZOLE 15 MG PO CPDR: 15.0000 mg | DELAYED_RELEASE_CAPSULE | ORAL | 1 refills | Status: DC | PRN

## 2018-07-27 NOTE — Progress Notes (Signed)
Subjective:    Patient ID: Jerome Sanchez, male    DOB: 05-Jan-1951, 67 y.o.   MRN: [id number]  HPI  The patient comes in today for a wellness visit.    A review of their health history was completed.  A review of medications was also completed.  Any needed refills; none  Eating habits: eating healthy  Falls/  MVA accidents in past few months: none  Regular exercise: moderate walking a few times a week  Specialist pt sees on regular basis: none  Preventative health issues were discussed.   Additional concerns: Discuss recent labs  Mod rsercisee thedays  .yhis Results for orders placed or performed in visit on 06/23/18  PSA  Result Value Ref Range   Prostate Specific Ag, Serum 1.1 0.0 - 4.0 ng/mL  Hemoglobin A1c  Result Value Ref Range   Hgb A1c MFr Bld 6.0 (H) 4.8 - 5.6 %   Est. average glucose Bld gHb Est-mCnc 126 mg/dL  Basic Metabolic Panel (BMET)  Result Value Ref Range   Glucose 108 (H) 65 - 99 mg/dL   BUN 18 8 - 27 mg/dL   Creatinine, Ser 1.07 0.76 - 1.27 mg/dL   GFR calc non Af Amer 71 >59 mL/min/1.73   GFR calc Af Amer 82 >59 mL/min/1.73   BUN/Creatinine Ratio 17 10 - 24   Sodium 142 134 - 144 mmol/L   Potassium 4.8 3.5 - 5.2 mmol/L   Chloride 103 96 - 106 mmol/L   CO2 23 20 - 29 mmol/L   Calcium 9.4 8.6 - 10.2 mg/dL  Hepatic function panel  Result Value Ref Range   Total Protein 6.8 6.0 - 8.5 g/dL   Albumin 4.4 3.6 - 4.8 g/dL   Bilirubin Total 0.3 0.0 - 1.2 mg/dL   Bilirubin, Direct 0.11 0.00 - 0.40 mg/dL   Alkaline Phosphatase 78 39 - 117 IU/L   AST 24 0 - 40 IU/L   ALT 26 0 - 44 IU/L  Lipid Profile  Result Value Ref Range   Cholesterol, Total 178 100 - 199 mg/dL   Triglycerides 52 0 - 149 mg/dL   HDL 76 >39 mg/dL   VLDL Cholesterol Cal 10 5 - 40 mg/dL   LDL Calculated 92 0 - 99 mg/dL   Chol/HDL Ratio 2.3 0.0 - 5.0 ratio   Blood pressure medicine and blood pressure levels reviewed today with patient. Compliant with blood pressure  medicine. States does not miss a dose. No obvious side effects. Blood pressure generally good when checked elsewhere. Watching salt intake.   Patient continues to take lipid medication regularly. No obvious side effects from it. Generally does not miss a dose. Prior blood work results are reviewed with patient. Patient continues to work on fat intake in diet  pred iabietes Review of Systems  Constitutional: Negative for activity change, appetite change and fever.  HENT: Negative for congestion and rhinorrhea.   Eyes: Negative for discharge.  Respiratory: Negative for cough and wheezing.   Cardiovascular: Negative for chest pain.  Gastrointestinal: Negative for abdominal pain, blood in stool and vomiting.  Genitourinary: Negative for difficulty urinating and frequency.  Musculoskeletal: Negative for neck pain.  Skin: Negative for rash.  Allergic/Immunologic: Negative for environmental allergies and food allergies.  Neurological: Negative for weakness and headaches.  Psychiatric/Behavioral: Negative for agitation.  All other systems reviewed and are negative.      Objective:   Physical Exam  Constitutional: He appears well-developed and well-nourished.  HENT:  Head: Normocephalic  and atraumatic.  Right Ear: External ear normal.  Left Ear: External ear normal.  Nose: Nose normal.  Mouth/Throat: Oropharynx is clear and moist.  Eyes: Right eye exhibits no discharge. Left eye exhibits no discharge. No scleral icterus.  Neck: Normal range of motion. Neck supple. No thyromegaly present.  Cardiovascular: Normal rate, regular rhythm and normal heart sounds.  No murmur heard. Pulmonary/Chest: Effort normal and breath sounds normal. No respiratory distress. He has no wheezes.  Abdominal: Soft. Bowel sounds are normal. He exhibits no distension and no mass. There is no tenderness.  Genitourinary: Penis normal.  Musculoskeletal: Normal range of motion. He exhibits no edema.    Lymphadenopathy:    He has no cervical adenopathy.  Neurological: He is alert. He exhibits normal muscle tone. Coordination normal.  Skin: Skin is warm and dry. No erythema.  Psychiatric: He has a normal mood and affect. His behavior is normal. Judgment normal.  Vitals reviewed.         Assessment & Plan:  Impression wellness exam colonoscopy done 7 yrs ago, patient instructed to contact his gastroenterologist Dr. Sydell Axon.  Diet discussed.  Exercise discussed.  Vaccines discussed.  2.  Hypertension good control discussed maintain same meds  3.  Hyperlipidemia.  Prior blood work reviewed.  Appropriate blood work ordered medications maintain  4.  Prediabetes.  A1c still nondiabetic thankfully.  Importance of diet and exercise discussed in this regard.  5.  COPD.  Need for occasional albuterol use.  Discussed with patient.  Will refill this.  Patient to use PRN.  If starts using frequently would encourage had a medication rationale discussed  Further orders as noted above

## 2019-01-22 ENCOUNTER — Encounter: Payer: Self-pay | Admitting: Family Medicine

## 2019-01-26 ENCOUNTER — Encounter: Payer: Self-pay | Admitting: Family Medicine

## 2019-01-26 ENCOUNTER — Other Ambulatory Visit: Payer: Self-pay

## 2019-01-26 ENCOUNTER — Ambulatory Visit (INDEPENDENT_AMBULATORY_CARE_PROVIDER_SITE_OTHER): Payer: Managed Care, Other (non HMO) | Admitting: Family Medicine

## 2019-01-26 DIAGNOSIS — R7303 Prediabetes: Secondary | ICD-10-CM

## 2019-01-26 DIAGNOSIS — J449 Chronic obstructive pulmonary disease, unspecified: Secondary | ICD-10-CM

## 2019-01-26 DIAGNOSIS — I10 Essential (primary) hypertension: Secondary | ICD-10-CM

## 2019-01-26 DIAGNOSIS — E782 Mixed hyperlipidemia: Secondary | ICD-10-CM | POA: Diagnosis not present

## 2019-01-26 MED ORDER — ALBUTEROL SULFATE HFA 108 (90 BASE) MCG/ACT IN AERS: 1.0000 | INHALATION_SPRAY | Freq: Four times a day (QID) | RESPIRATORY_TRACT | 5 refills | Status: DC | PRN

## 2019-01-26 MED ORDER — HYDROCHLOROTHIAZIDE 25 MG PO TABS: ORAL_TABLET | ORAL | 5 refills | Status: DC

## 2019-01-26 MED ORDER — ATORVASTATIN CALCIUM 80 MG PO TABS: 80.0000 mg | ORAL_TABLET | Freq: Every day | ORAL | 1 refills | Status: DC

## 2019-01-26 MED ORDER — ENALAPRIL MALEATE 20 MG PO TABS: ORAL_TABLET | ORAL | 1 refills | Status: DC

## 2019-01-26 NOTE — Progress Notes (Signed)
   Subjective:    Patient ID: Jerome Sanchez, male    DOB: 1951/05/30, 68 y.o.   MRN: [id number] Audio plus video  Patient has numerous concerns Hyperlipidemia  This is a chronic problem. The current episode started more than 1 year ago. There are no compliance problems (takes meds every day, eats healthy, exercises).    [place] about 2 weeks ago and hurt left shoulder. Still having some pain.   Needs refill on albuterol inhaler.  Known history of COPD.  Uses inhaler rarely  Virtual Visit via Telephone Note  I connected with Jerome Sanchez on 01/26/19 at  3:00 PM EDT by telephone and verified that I am speaking with the correct person using two identifiers.  Location: Patient: home Provider: office   I discussed the limitations, risks, security and privacy concerns of performing an evaluation and management service by telephone and the availability of in person appointments. I also discussed with the patient that there may be a patient responsible charge related to this service. The patient expressed understanding and agreed to proceed.   History of Present Illness:    Observations/Objective:   Assessment and Plan:   Follow Up Instructions:    I discussed the assessment and treatment plan with the patient. The patient was provided an opportunity to ask questions and all were answered. The patient agreed with the plan and demonstrated an understanding of the instructions.   The patient was advised to call back or seek an in-person evaluation if the symptoms worsen or if the condition fails to improve as anticipated.  I provided 25 minutes of non-face-to-face time during this encounter.   Patient continues to take lipid medication regularly. No obvious side effects from it. Generally does not miss a dose. Prior blood work results are reviewed with patient. Patient continues to work on fat intake in diet  Blood pressure medicine and blood pressure levels reviewed today with  patient. Compliant with blood pressure medicine. States does not miss a dose. No obvious side effects. Blood pressure generally not good when checked elsewhere systolic too high over the past year.  Discussed. Watching salt intake.      Review of Systems No headache, no major weight loss or weight gain, no chest pain no back pain abdominal pain no change in bowel habits complete ROS otherwise negative     Objective:   Physical Exam  Virtual positive pain with shoulder extension on video      Assessment & Plan:  Impression 1 hypertension suboptimal discussed to add low-dose hydrochlorothiazide one half 25 mg tablet daily  2.  Hyperlipidemia prior blood work reviewed diet discussed compliance discussed  3.  Impingement left shoulder with trauma.  Majority of injury discussed [place]'s exercise encouraged  4.  COPD with asthma clinically stable albuterol as needed exercise encouraged  Follow-up in 6 months for wellness plus chronic

## 2019-07-06 ENCOUNTER — Other Ambulatory Visit: Payer: Self-pay

## 2019-07-06 DIAGNOSIS — Z20822 Contact with and (suspected) exposure to covid-19: Secondary | ICD-10-CM

## 2019-07-08 LAB — NOVEL CORONAVIRUS, NAA: SARS-CoV-2, NAA: NOT DETECTED

## 2019-07-25 ENCOUNTER — Encounter: Payer: Self-pay | Admitting: Family Medicine

## 2019-07-25 DIAGNOSIS — Z125 Encounter for screening for malignant neoplasm of prostate: Secondary | ICD-10-CM

## 2019-07-25 DIAGNOSIS — Z79899 Other long term (current) drug therapy: Secondary | ICD-10-CM

## 2019-07-25 DIAGNOSIS — R7303 Prediabetes: Secondary | ICD-10-CM

## 2019-07-25 DIAGNOSIS — I10 Essential (primary) hypertension: Secondary | ICD-10-CM

## 2019-07-25 DIAGNOSIS — E782 Mixed hyperlipidemia: Secondary | ICD-10-CM

## 2019-07-28 LAB — BASIC METABOLIC PANEL
BUN/Creatinine Ratio: 17 (ref 10–24)
BUN: 22 mg/dL (ref 8–27)
CO2: 26 mmol/L (ref 20–29)
Calcium: 9.9 mg/dL (ref 8.6–10.2)
Chloride: 102 mmol/L (ref 96–106)
Creatinine, Ser: 1.27 mg/dL (ref 0.76–1.27)
GFR calc Af Amer: 66 mL/min/{1.73_m2} (ref >59)
GFR calc non Af Amer: 57 mL/min/{1.73_m2} — ABNORMAL LOW (ref >59)
Glucose: 122 mg/dL — ABNORMAL HIGH (ref 65–99)
Potassium: 4.9 mmol/L (ref 3.5–5.2)
Sodium: 142 mmol/L (ref 134–144)

## 2019-07-28 LAB — HEPATIC FUNCTION PANEL
ALT: 26 IU/L (ref 0–44)
AST: 29 IU/L (ref 0–40)
Albumin: 4.9 g/dL — ABNORMAL HIGH (ref 3.8–4.8)
Alkaline Phosphatase: 58 IU/L (ref 39–117)
Bilirubin Total: 0.6 mg/dL (ref 0.0–1.2)
Bilirubin, Direct: 0.17 mg/dL (ref 0.00–0.40)
Total Protein: 7.6 g/dL (ref 6.0–8.5)

## 2019-07-28 LAB — LIPID PANEL
Chol/HDL Ratio: 2.2 ratio (ref 0.0–5.0)
Cholesterol, Total: 217 mg/dL — ABNORMAL HIGH (ref 100–199)
HDL: 98 mg/dL (ref >39)
LDL Chol Calc (NIH): 102 mg/dL — ABNORMAL HIGH (ref 0–99)
Triglycerides: 98 mg/dL (ref 0–149)
VLDL Cholesterol Cal: 17 mg/dL (ref 5–40)

## 2019-07-28 LAB — HEMOGLOBIN A1C
Est. average glucose Bld gHb Est-mCnc: 126 mg/dL
Hgb A1c MFr Bld: 6 % — ABNORMAL HIGH (ref 4.8–5.6)

## 2019-07-28 LAB — PSA: Prostate Specific Ag, Serum: 1.3 ng/mL (ref 0.0–4.0)

## 2019-07-29 ENCOUNTER — Other Ambulatory Visit: Payer: Self-pay

## 2019-07-29 ENCOUNTER — Ambulatory Visit (INDEPENDENT_AMBULATORY_CARE_PROVIDER_SITE_OTHER): Payer: Managed Care, Other (non HMO) | Admitting: Family Medicine

## 2019-07-29 ENCOUNTER — Encounter: Payer: Self-pay | Admitting: Family Medicine

## 2019-07-29 VITALS — BP 138/92 | Temp 97.8°F | Ht 67.0 in | Wt 215.0 lb

## 2019-07-29 DIAGNOSIS — Z1211 Encounter for screening for malignant neoplasm of colon: Secondary | ICD-10-CM | POA: Diagnosis not present

## 2019-07-29 DIAGNOSIS — E782 Mixed hyperlipidemia: Secondary | ICD-10-CM | POA: Diagnosis not present

## 2019-07-29 DIAGNOSIS — Z23 Encounter for immunization: Secondary | ICD-10-CM | POA: Diagnosis not present

## 2019-07-29 DIAGNOSIS — I10 Essential (primary) hypertension: Secondary | ICD-10-CM

## 2019-07-29 DIAGNOSIS — Z0000 Encounter for general adult medical examination without abnormal findings: Secondary | ICD-10-CM | POA: Diagnosis not present

## 2019-07-29 DIAGNOSIS — R7303 Prediabetes: Secondary | ICD-10-CM

## 2019-07-29 MED ORDER — ENALAPRIL MALEATE 20 MG PO TABS: ORAL_TABLET | ORAL | 1 refills | Status: DC

## 2019-07-29 MED ORDER — HYDROCHLOROTHIAZIDE 25 MG PO TABS: ORAL_TABLET | ORAL | 1 refills | Status: DC

## 2019-07-29 MED ORDER — SHINGRIX 50 MCG/0.5ML IM SUSR: 0.5000 mL | Freq: Once | INTRAMUSCULAR | 1 refills | Status: DC

## 2019-07-29 MED ORDER — ATORVASTATIN CALCIUM 80 MG PO TABS: 80.0000 mg | ORAL_TABLET | Freq: Every day | ORAL | 1 refills | Status: DC

## 2019-07-29 MED ORDER — SHINGRIX 50 MCG/0.5ML IM SUSR: 0.5000 mL | Freq: Once | INTRAMUSCULAR | 1 refills | Status: AC
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
## 2019-08-02 ENCOUNTER — Telehealth: Payer: Self-pay | Admitting: *Deleted

## 2019-08-02 ENCOUNTER — Other Ambulatory Visit: Payer: Self-pay | Admitting: *Deleted

## 2019-08-02 DIAGNOSIS — Z1211 Encounter for screening for malignant neoplasm of colon: Secondary | ICD-10-CM

## 2019-08-02 LAB — IFOBT (OCCULT BLOOD): IFOBT: POSITIVE

## 2019-08-02 NOTE — Telephone Encounter (Signed)
See lab results note.

## 2019-08-02 NOTE — Telephone Encounter (Signed)
Patient dropped off sample this afternoon   IFOBT positive

## 2019-08-02 NOTE — Telephone Encounter (Signed)
Patient was notified and GI referral ordered in Epic-see result note

## 2019-08-04 ENCOUNTER — Encounter (INDEPENDENT_AMBULATORY_CARE_PROVIDER_SITE_OTHER): Payer: Self-pay | Admitting: *Deleted

## 2019-08-04 ENCOUNTER — Encounter: Payer: Self-pay | Admitting: Family Medicine

## 2019-09-08 ENCOUNTER — Encounter: Payer: Self-pay | Admitting: Family Medicine

## 2019-09-22 ENCOUNTER — Encounter: Payer: Self-pay | Admitting: Family Medicine

## 2020-01-09 ENCOUNTER — Encounter (INDEPENDENT_AMBULATORY_CARE_PROVIDER_SITE_OTHER): Payer: Self-pay | Admitting: Gastroenterology

## 2020-01-09 ENCOUNTER — Ambulatory Visit (INDEPENDENT_AMBULATORY_CARE_PROVIDER_SITE_OTHER): Payer: Managed Care, Other (non HMO) | Admitting: Gastroenterology

## 2020-01-09 ENCOUNTER — Encounter (INDEPENDENT_AMBULATORY_CARE_PROVIDER_SITE_OTHER): Payer: Self-pay | Admitting: *Deleted

## 2020-01-09 ENCOUNTER — Other Ambulatory Visit: Payer: Self-pay

## 2020-01-09 ENCOUNTER — Other Ambulatory Visit (INDEPENDENT_AMBULATORY_CARE_PROVIDER_SITE_OTHER): Payer: Self-pay | Admitting: *Deleted

## 2020-01-09 ENCOUNTER — Telehealth (INDEPENDENT_AMBULATORY_CARE_PROVIDER_SITE_OTHER): Payer: Self-pay | Admitting: *Deleted

## 2020-01-09 VITALS — BP 175/94 | HR 89 | Temp 96.8°F | Ht 70.0 in | Wt 217.2 lb

## 2020-01-09 DIAGNOSIS — R195 Other fecal abnormalities: Secondary | ICD-10-CM | POA: Diagnosis not present

## 2020-01-09 DIAGNOSIS — Z86010 Personal history of colonic polyps: Secondary | ICD-10-CM | POA: Diagnosis not present

## 2020-01-09 DIAGNOSIS — K219 Gastro-esophageal reflux disease without esophagitis: Secondary | ICD-10-CM

## 2020-01-09 LAB — CBC WITH DIFFERENTIAL/PLATELET
Absolute Monocytes: 824 cells/uL (ref 200–950)
Basophils Absolute: 64 cells/uL (ref 0–200)
Basophils Relative: 0.8 %
Eosinophils Absolute: 432 cells/uL (ref 15–500)
Eosinophils Relative: 5.4 %
HCT: 44.1 % (ref 38.5–50.0)
Hemoglobin: 14.5 g/dL (ref 13.2–17.1)
Lymphs Abs: 2096 cells/uL (ref 850–3900)
MCH: 31.7 pg (ref 27.0–33.0)
MCHC: 32.9 g/dL (ref 32.0–36.0)
MCV: 96.5 fL (ref 80.0–100.0)
MPV: 10.2 fL (ref 7.5–12.5)
Monocytes Relative: 10.3 %
Neutro Abs: 4584 cells/uL (ref 1500–7800)
Neutrophils Relative %: 57.3 %
Platelets: 234 10*3/uL (ref 140–400)
RBC: 4.57 10*6/uL (ref 4.20–5.80)
RDW: 13 % (ref 11.0–15.0)
Total Lymphocyte: 26.2 %
WBC: 8 10*3/uL (ref 3.8–10.8)

## 2020-01-09 NOTE — Telephone Encounter (Signed)
Patient needs Plenvu (copay card) ° °

## 2020-01-09 NOTE — Progress Notes (Signed)
Patient profile: Jerome Sanchez is a 69 y.o. male seen for evaluation of positive Hemoccult done December 2020  History of Present Illness: Jerome Sanchez is seen today for evaluation of positive Hemoccult.  This was done on routine screening.  He has not seen any obvious blood in stool or dark stools.  He has very occasional constipation or diarrhea that is self-limited to 1 to 2 days and does not need over-the-counter medications.  He overall feels bowels are well controlled.  Denies abdominal pain.  He currently takes lansoprazole every other day with good control of GERD symptoms.  He denies any nausea, vomiting, dysphagia.  Appetite good weight stable.  Wt Readings from Last 3 Encounters:  01/09/20 217 lb 3.2 oz (98.5 kg)  07/29/19 215 lb (97.5 kg)  07/27/18 201 lb 12.8 oz (91.5 kg)     Last Colonoscopy: 2011--diverticulosis, small tubular adenoma ascending colon.   Last Endoscopy: None prior   Past Medical History:  Past Medical History:  Diagnosis Date  . Essential hypertension, benign   . Impaired glucose tolerance   . Injury of right rotator cuff   . Lumbar disc disease   . Mixed hyperlipidemia     Problem List: Patient Active Problem List   Diagnosis Date Noted  . COPD with asthma ([place]) 07/27/2018  . Prostate hypertrophy 07/16/2017  . Esophageal reflux 01/07/2016  . Impaired fasting glucose 11/10/2012  . COPD exacerbation ([place]) 11/10/2012  . Shortness of breath 08/05/2012  . Abnormal ECG 08/05/2012  . Mixed hyperlipidemia 08/05/2012  . Essential hypertension, benign 10/26/2008    Past Surgical History: History reviewed. No pertinent surgical history.  Allergies: Allergies  Allergen Reactions  . Penicillins     N&V      Home Medications:  Current Outpatient Medications:  .  albuterol (VENTOLIN HFA) 108 (90 Base) MCG/ACT inhaler, Inhale 1-2 puffs into the lungs every 6 (six) hours as needed for wheezing or shortness of breath., Disp: 1 Inhaler, Rfl:  5 .  atorvastatin (LIPITOR) 80 MG tablet, Take 1 tablet (80 mg total) by mouth daily., Disp: 90 tablet, Rfl: 1 .  enalapril (VASOTEC) 20 MG tablet, TAKE 1 TABLET BY MOUTH TWICE DAILY, Disp: 180 tablet, Rfl: 1 .  hydrochlorothiazide (HYDRODIURIL) 25 MG tablet, Take one tablet po daily, Disp: 90 tablet, Rfl: 1 .  lansoprazole (PREVACID) 15 MG capsule, Take 1 capsule (15 mg total) by mouth as needed., Disp: 90 capsule, Rfl: 1 .  naproxen sodium (ANAPROX) 220 MG tablet, Take 220 mg by mouth as needed., Disp: , Rfl:  .  OVER THE COUNTER MEDICATION, Areds ( omega 3, lutein, zeaxanthin) take one a day, Disp: , Rfl:  .  OVER THE COUNTER MEDICATION, Zyflamend - Combination of herbal supplement - patient takes 1 daily., Disp: , Rfl:  .  Potassium 99 MG TABS, Take by mouth daily., Disp: , Rfl:  .  PEG-KCl-NaCl-NaSulf-Na Asc-C (PLENVU) 140 g SOLR, Take 1 kit by mouth once for 1 dose., Disp: 1 each, Rfl: 0  Current Facility-Administered Medications:  .  methylPREDNISolone acetate (DEPO-MEDROL) injection 40 mg, 40 mg, Intra-articular, Once, Luk[place], Grace Bushy, MD   Family History: family history includes Diabetes type II in his father; Hypertension in his sister.  Denies family history of colon polyps colon cancer  Social History:   reports that he has quit smoking. His smoking use included cigarettes. He has never used smokeless tobacco. He reports current alcohol use. He reports that he does not use drugs.  Review of Systems: Constitutional: Denies weight loss/weight gain  Eyes: No changes in vision. ENT: No oral lesions, sore throat.  GI: see HPI.  Heme/Lymph: No easy bruising.  CV: No chest pain.  GU: No hematuria.  Integumentary: No rashes.  Neuro: No headaches.  Psych: No depression/anxiety.  Endocrine: No heat/cold intolerance.  Allergic/Immunologic: No urticaria.  Resp: No cough, SOB.  Musculoskeletal: No joint swelling.    Physical Examination: BP (!) 175/94 (BP Location: Right Arm,  Patient Position: Sitting, Cuff Size: Large)   Pulse 89   Temp (!) 96.8 F (36 C) (Temporal)   Ht _0  (1.778 m)   Wt 217 lb 3.2 oz (98.5 kg)   BMI 31.16 kg/m  Gen: NAD, alert and oriented x 4 HEENT: PEERLA, EOMI, Neck: supple, no JVD Chest: CTA bilaterally, no wheezes, crackles, or other adventitious sounds CV: RRR, no m/g/c/r Abd: soft, NT, ND, +BS in all four quadrants; no HSM, guarding, ridigity, or rebound tenderness Ext: no edema, well perfused with 2+ pulses, Skin: no rash or lesions noted on observed skin Lymph: no noted LAD  Data:  07/2019-LFTs with albumin 4.9, glucose 122, GFR 57, otherwise normal.  A1c 6.0   Positive Hemoccult December 2020   Assessment/Plan: [name] is a 69 y.o. male  Jerome Sanchez was seen today for new patient (initial visit).  Diagnoses and all orders for this visit:  Fecal occult blood test positive -     CBC with Differential  Personal history of colonic polyps -     CBC with Differential  Chronic GERD -     CBC with Differential     1.  Chronic GERD-well-controlled PPI every other day.  We discussed remaining upright after meals and avoiding late meals which is his biggest symptom trigger.  No upper GI alarm symptoms.  2.  Positive fecal occult blood-no CBC on file on epic reviewed.  Will check hemoglobin for stability. Schedule EGD/colonoscopy for evaluation.  3.  History of colon polyps-tubular adenoma in 2011, repeat colonoscopy as above  Patient denies CP, SOB, and use of blood thinners. I discussed the risks and benefits of procedure including bleeding, perforation, infection, missed lesions, medication reactions and possible hospitalization or surgery if complications. All questions answered. No prior issues w/ sedation    I personally performed the service, non-incident to. (WP)  Laurine Blazer, [facility]

## 2020-01-09 NOTE — Patient Instructions (Signed)
We are scheduling endoscopy and colonoscopy for evaluation.  We will contact you with your lab results.  Please call us with any health changes in interim.  Avoid late meals as discussed

## 2020-01-10 MED ORDER — PLENVU 140 G PO SOLR: 1.0000 | Freq: Once | ORAL | 0 refills | Status: AC

## 2020-01-11 ENCOUNTER — Telehealth: Payer: Self-pay | Admitting: *Deleted

## 2020-01-11 DIAGNOSIS — R7303 Prediabetes: Secondary | ICD-10-CM

## 2020-01-11 DIAGNOSIS — I10 Essential (primary) hypertension: Secondary | ICD-10-CM

## 2020-01-11 DIAGNOSIS — E782 Mixed hyperlipidemia: Secondary | ICD-10-CM

## 2020-01-11 DIAGNOSIS — Z79899 Other long term (current) drug therapy: Secondary | ICD-10-CM

## 2020-01-11 NOTE — Telephone Encounter (Signed)
Blood work ordered in Epic. Left message to return call to notify patient. 

## 2020-01-11 NOTE — Telephone Encounter (Signed)
Pls order, cbc, cmp, lipid panel.  Obtain fasting about 1 wk prior to appt.  Thx,   Dr. Lovena Le

## 2020-01-11 NOTE — Telephone Encounter (Signed)
Patient has appointment 01/30/20 for med check  Last labs 07/27/2019: Lipid, Liver, Met 7, HgbA1c, PSA

## 2020-01-12 NOTE — Telephone Encounter (Signed)
Left message to return call 

## 2020-01-13 NOTE — Telephone Encounter (Signed)
Left message to return call 

## 2020-01-17 ENCOUNTER — Other Ambulatory Visit: Payer: Self-pay

## 2020-01-17 ENCOUNTER — Other Ambulatory Visit (HOSPITAL_COMMUNITY)
Admission: RE | Admit: 2020-01-17 | Discharge: 2020-01-17 | Disposition: A | Discharge status: Home / Self Care | Payer: Managed Care, Other (non HMO) | Source: Ambulatory Visit | Attending: Internal Medicine | Admitting: Internal Medicine

## 2020-01-17 DIAGNOSIS — Z20822 Contact with and (suspected) exposure to covid-19: Secondary | ICD-10-CM | POA: Insufficient documentation

## 2020-01-17 DIAGNOSIS — Z01812 Encounter for preprocedural laboratory examination: Secondary | ICD-10-CM | POA: Diagnosis not present

## 2020-01-17 LAB — SARS CORONAVIRUS 2 (TAT 6-24 HRS): SARS Coronavirus 2: NEGATIVE

## 2020-01-17 NOTE — Telephone Encounter (Signed)
Patient notified

## 2020-01-19 ENCOUNTER — Encounter (HOSPITAL_COMMUNITY)
Admission: RE | Disposition: A | Discharge status: Home / Self Care | Payer: Self-pay | Source: Home / Self Care | Attending: Internal Medicine

## 2020-01-19 ENCOUNTER — Other Ambulatory Visit: Payer: Self-pay

## 2020-01-19 ENCOUNTER — Ambulatory Visit (HOSPITAL_COMMUNITY)
Admission: RE | Admit: 2020-01-19 | Discharge: 2020-01-19 | Disposition: A | Discharge status: Home / Self Care | Payer: Managed Care, Other (non HMO) | Attending: Internal Medicine | Admitting: Internal Medicine

## 2020-01-19 ENCOUNTER — Encounter (HOSPITAL_COMMUNITY): Payer: Self-pay | Admitting: Internal Medicine

## 2020-01-19 DIAGNOSIS — K5730 Diverticulosis of large intestine without perforation or abscess without bleeding: Secondary | ICD-10-CM | POA: Diagnosis not present

## 2020-01-19 DIAGNOSIS — K3189 Other diseases of stomach and duodenum: Secondary | ICD-10-CM | POA: Diagnosis not present

## 2020-01-19 DIAGNOSIS — K449 Diaphragmatic hernia without obstruction or gangrene: Secondary | ICD-10-CM | POA: Diagnosis not present

## 2020-01-19 DIAGNOSIS — K2970 Gastritis, unspecified, without bleeding: Secondary | ICD-10-CM

## 2020-01-19 DIAGNOSIS — I10 Essential (primary) hypertension: Secondary | ICD-10-CM | POA: Insufficient documentation

## 2020-01-19 DIAGNOSIS — R05 Cough: Secondary | ICD-10-CM | POA: Diagnosis not present

## 2020-01-19 DIAGNOSIS — Z86010 Personal history of colonic polyps: Secondary | ICD-10-CM

## 2020-01-19 DIAGNOSIS — E782 Mixed hyperlipidemia: Secondary | ICD-10-CM | POA: Insufficient documentation

## 2020-01-19 DIAGNOSIS — D123 Benign neoplasm of transverse colon: Secondary | ICD-10-CM | POA: Diagnosis not present

## 2020-01-19 DIAGNOSIS — Z79899 Other long term (current) drug therapy: Secondary | ICD-10-CM | POA: Diagnosis not present

## 2020-01-19 DIAGNOSIS — K219 Gastro-esophageal reflux disease without esophagitis: Secondary | ICD-10-CM

## 2020-01-19 DIAGNOSIS — R195 Other fecal abnormalities: Secondary | ICD-10-CM

## 2020-01-19 DIAGNOSIS — K2980 Duodenitis without bleeding: Secondary | ICD-10-CM | POA: Insufficient documentation

## 2020-01-19 DIAGNOSIS — Z87891 Personal history of nicotine dependence: Secondary | ICD-10-CM | POA: Insufficient documentation

## 2020-01-19 DIAGNOSIS — K2990 Gastroduodenitis, unspecified, without bleeding: Secondary | ICD-10-CM

## 2020-01-19 HISTORY — PX: BIOPSY: SHX5522

## 2020-01-19 HISTORY — PX: POLYPECTOMY: SHX5525

## 2020-01-19 HISTORY — PX: ESOPHAGOGASTRODUODENOSCOPY: SHX5428

## 2020-01-19 HISTORY — PX: COLONOSCOPY: SHX5424

## 2020-01-19 SURGERY — EGD (ESOPHAGOGASTRODUODENOSCOPY)
Anesthesia: Moderate Sedation

## 2020-01-19 MED ORDER — MIDAZOLAM HCL 5 MG/5ML IJ SOLN
INTRAMUSCULAR | Status: AC
  Filled 2020-01-19: qty 10

## 2020-01-19 MED ORDER — LIDOCAINE VISCOUS HCL 2 % MT SOLN
OROMUCOSAL | Status: DC | PRN
  Administered 2020-01-19: 1 via OROMUCOSAL

## 2020-01-19 MED ORDER — MEPERIDINE HCL 50 MG/ML IJ SOLN
INTRAMUSCULAR | Status: AC
  Filled 2020-01-19: qty 1

## 2020-01-19 MED ORDER — SODIUM CHLORIDE 0.9 % IV SOLN: INTRAVENOUS | Status: DC

## 2020-01-19 MED ORDER — MIDAZOLAM HCL 5 MG/5ML IJ SOLN
INTRAMUSCULAR | Status: DC | PRN
  Administered 2020-01-19 (×3): 1 mg via INTRAVENOUS
  Administered 2020-01-19: 2 mg via INTRAVENOUS

## 2020-01-19 MED ORDER — MEPERIDINE HCL 50 MG/ML IJ SOLN
INTRAMUSCULAR | Status: DC | PRN
  Administered 2020-01-19 (×2): 25 mg

## 2020-01-19 MED ORDER — LIDOCAINE VISCOUS HCL 2 % MT SOLN
OROMUCOSAL | Status: AC
  Filled 2020-01-19: qty 15

## 2020-01-19 NOTE — Op Note (Signed)
[facility] Patient Name: Jerome Sanchez Procedure Date: 01/19/2020 12:51 PM MRN: [id number] Date of Birth: 04-09-1951 Attending MD: Hildred Laser , MD CSN: [id number] Age: 69 Admit Type: Outpatient Procedure:                Upper GI endoscopy Indications:              Follow-up of gastro-esophageal reflux disease Providers:                Hildred Laser, MD, Janeece Riggers, RN, Nelma Rothman,                            Technician Referring MD:             Erven Colla, DO Medicines:                Lidocaine spray, Meperidine 50 mg IV, Midazolam 4                            mg IV Complications:            No immediate complications. Estimated Blood Loss:     Estimated blood loss was minimal. Procedure:                Pre-Anesthesia Assessment:                           - Prior to the procedure, a History and Physical                            was performed, and patient medications and                            allergies were reviewed. The patient's tolerance of                            previous anesthesia was also reviewed. The risks                            and benefits of the procedure and the sedation                            options and risks were discussed with the patient.                            All questions were answered, and informed consent                            was obtained. Prior Anticoagulants: The patient has                            taken no previous anticoagulant or antiplatelet                            agents except for NSAID medication. ASA Grade  Assessment: II - A patient with mild systemic                            disease. After reviewing the risks and benefits,                            the patient was deemed in satisfactory condition to                            undergo the procedure.                           After obtaining informed consent, the endoscope was                            passed under direct  vision. Throughout the                            procedure, the patient's blood pressure, pulse, and                            oxygen saturations were monitored continuously. The                            [id number] scope was introduced through the                            mouth, and advanced to the second part of duodenum.                            The upper GI endoscopy was accomplished without                            difficulty. The patient tolerated the procedure                            well. Scope In: 12:59:52 PM Scope Out: 1:07:55 PM Total Procedure Duration: 0 hours 8 minutes 3 seconds  Findings:      The hypopharynx was normal.      The proximal esophagus, mid esophagus and distal esophagus were normal       except small patch of triangular was found in the distal esophagus.       Biopsies were taken with a cold forceps for histology. The pathology       specimen was placed into Bottle Number 1.      were found in the distal esophagus. Biopsies were taken with a cold       forceps for histology. The pathology specimen was placed into Bottle       Number 1.      The Z-line was regular and was found 40 cm from the incisors.      A 3 cm hiatal hernia was present.      A few erosions with no stigmata of recent bleeding were found in the       gastric antrum.      The exam of  the stomach was otherwise normal.      Patchy mild inflammation characterized by congestion (edema), erosions       and erythema was found in the duodenal bulb.      The second portion of the duodenum was normal. Impression:               - Normal hypopharynx.                           - Normal proximal esophagus, mid esophagus and                            distal esophagus except small triangular patch of                            salmon colored mucosa. mucosa in the esophagus.                            Biopsied.                           - Z-line regular, 40 cm from the incisors.                            - 3 cm hiatal hernia.                           - Erosive gastropathy with no stigmata of recent                            bleeding.                           - Duodenitis.                           - Normal second portion of the duodenum. Moderate Sedation:      Moderate (conscious) sedation was administered by the endoscopy nurse       and supervised by the endoscopist. The following parameters were       monitored: oxygen saturation, heart rate, blood pressure, CO2       capnography and response to care. Total physician intraservice time was       12 minutes. Recommendation:           - Patient has a contact number available for                            emergencies. The signs and symptoms of potential                            delayed complications were discussed with the                            patient. Return to normal activities tomorrow.                            Written discharge instructions were  provided to the                            patient.                           - Continue present medications.                           - H.Pylori serology.                           - Await pathology results. Procedure Code(s):        --- Professional ---                           [phone], Esophagogastroduodenoscopy, flexible,                            transoral; with biopsy, single or multiple                           G0500, Moderate sedation services provided by the                            same physician or other qualified health care                            professional performing a gastrointestinal                            endoscopic service that sedation supports,                            requiring the presence of an independent trained                            observer to assist in the monitoring of the                            patient's level of consciousness and physiological                            status; initial 15 minutes of  intra-service time;                            patient age 29 years or older (additional time may                            be reported with [phone], as appropriate) Diagnosis Code(s):        --- Professional ---                           K44.9, Diaphragmatic hernia without obstruction or  gangrene                           K31.89, Other diseases of stomach and duodenum                           K29.80, Duodenitis without bleeding                           K21.9, Gastro-esophageal reflux disease without                            esophagitis CPT copyright 2019 [employer]. All rights reserved. The codes documented in this report are preliminary and upon coder review may  be revised to meet current compliance requirements. Hildred Laser, MD Hildred Laser, MD 01/19/2020 1:52:03 PM This report has been signed electronically. Number of Addenda: 0

## 2020-01-19 NOTE — H&P (Signed)
Jerome Sanchez is an 69 y.o. male.   Chief Complaint: Patient is here for esophagogastroduodenoscopy and colonoscopy. HPI: Patient is 69 year old Caucasian male who was recently found to have heme positive stool.  His H&H was normal.  He denies melena or rectal bleeding or abdominal pain.  He has had symptoms of GERD for about 5 years.  He dexlansoprazole 3 times a week.  He watches his diet.  He denies dysphagia nausea vomiting hoarseness or sore throat.  He has a chronic cough which he blames on prior cigarette smoking.  He has good appetite and his weight has been stable.  Last colonoscopy was about 10 years ago with removal of single small tubular adenoma.  Follow-up examinations recommended after 7 years but he has not had a follow-up exam. He takes Aleve no more than once a week.  He does not take other OTC NSAIDs. Family history is negative for CRC.  Past Medical History:  Diagnosis Date  . Essential hypertension, benign   . Impaired glucose tolerance   . Injury of right rotator cuff   . Lumbar disc disease   . Mixed hyperlipidemia     History reviewed. No pertinent surgical history.  Family History  Problem Relation Age of Onset  . Hypertension Sister   . Diabetes type II Father    Social History:  reports that he has quit smoking. His smoking use included cigarettes. He has never used smokeless tobacco. He reports current alcohol use. He reports that he does not use drugs.  Allergies:  Allergies  Allergen Reactions  . Penicillins     N&V    Facility-Administered Medications Prior to Admission  Medication Dose Route Frequency Provider Last Rate Last Admin  . methylPREDNISolone acetate (DEPO-MEDROL) injection 40 mg  40 mg Intra-articular Once Mikey Kirschner, MD       Medications Prior to Admission  Medication Sig Dispense Refill  . albuterol (VENTOLIN HFA) 108 (90 Base) MCG/ACT inhaler Inhale 1-2 puffs into the lungs every 6 (six) hours as needed for wheezing or  shortness of breath. 1 Inhaler 5  . atorvastatin (LIPITOR) 80 MG tablet Take 1 tablet (80 mg total) by mouth daily. 90 tablet 1  . enalapril (VASOTEC) 20 MG tablet TAKE 1 TABLET BY MOUTH TWICE DAILY 180 tablet 1  . hydrochlorothiazide (HYDRODIURIL) 25 MG tablet Take one tablet po daily 90 tablet 1  . lansoprazole (PREVACID) 15 MG capsule Take 1 capsule (15 mg total) by mouth as needed. 90 capsule 1  . naproxen sodium (ANAPROX) 220 MG tablet Take 220 mg by mouth as needed.    Marland Kitchen OVER THE COUNTER MEDICATION Areds ( omega 3, lutein, zeaxanthin) take one a day    . OVER THE COUNTER MEDICATION Zyflamend - Combination of herbal supplement - patient takes 1 daily.    . Potassium 99 MG TABS Take by mouth daily.      No results found for this or any previous visit (from the past 48 hour(s)). No results found.  Review of Systems  Blood pressure (!) 148/91, pulse 89, temperature 98 F (36.7 C), temperature source Oral, resp. rate 18, height 5\' 10"  (1.778 m), weight 97.5 kg, SpO2 96 %. Physical Exam  Constitutional: He appears well-developed and well-nourished.  HENT:  Mouth/Throat: Oropharynx is clear and moist.  Eyes: Conjunctivae are normal. No scleral icterus.  Neck: No thyromegaly present.  Cardiovascular: Normal rate, regular rhythm and normal heart sounds.  No murmur heard. Respiratory: Effort normal and breath sounds  normal.  GI:  Abdomen is full but soft and nontender with organomegaly or masses  Musculoskeletal:        General: No edema.  Lymphadenopathy:    He has no cervical adenopathy.  Neurological: He is alert.  Skin: Skin is warm and dry.     Assessment/Plan Chronic GERD. Heme positive stool. History of low-grade tubular adenoma. Diagnostic esophagogastroduodenoscopy and colonoscopy.  Hildred Laser, MD 01/19/2020, 12:49 PM

## 2020-01-19 NOTE — Discharge Instructions (Signed)
No aspirin or NSAIDs for 24 hours. Resume usual medications as before. High-fiber diet. No driving for 24 hours. Physician will call with biopsy and blood test results.      Upper Endoscopy, Adult, Care After This sheet gives you information about how to care for yourself after your procedure. Your health care provider may also give you more specific instructions. If you have problems or questions, contact your health care provider. What can I expect after the procedure? After the procedure, it is common to have:  A sore throat.  Mild stomach pain or discomfort.  Bloating.  Nausea. Follow these instructions at home:   Follow instructions from your health care provider about what to eat or drink after your procedure.  Return to your normal activities as told by your health care provider. Ask your health care provider what activities are safe for you.  Take over-the-counter and prescription medicines only as told by your health care provider.  Do not drive for 24 hours if you were given a sedative during your procedure.  Keep all follow-up visits as told by your health care provider. This is important. Contact a health care provider if you have:  A sore throat that lasts longer than one day.  Trouble swallowing. Get help right away if:  You vomit blood or your vomit looks like coffee grounds.  You have: ? A fever. ? Bloody, black, or tarry stools. ? A severe sore throat or you cannot swallow. ? Difficulty breathing. ? Severe pain in your chest or abdomen. Summary  After the procedure, it is common to have a sore throat, mild stomach discomfort, bloating, and nausea.  Do not drive for 24 hours if you were given a sedative during the procedure.  Follow instructions from your health care provider about what to eat or drink after your procedure.  Return to your normal activities as told by your health care provider. This information is not intended to replace  advice given to you by your health care provider. Make sure you discuss any questions you have with your health care provider. Document Revised: 01/26/2018 Document Reviewed: 01/04/2018 Elsevier Patient Education  [place].     Colonoscopy, Adult, Care After This sheet gives you information about how to care for yourself after your procedure. Your doctor may also give you more specific instructions. If you have problems or questions, call your doctor. What can I expect after the procedure? After the procedure, it is common to have:  A small amount of blood in your poop (stool) for 24 hours.  Some gas.  Mild cramping or bloating in your belly (abdomen). Follow these instructions at home: Eating and drinking   Drink enough fluid to keep your pee (urine) pale yellow.  Follow instructions from your doctor about what you cannot eat or drink.  Return to your normal diet as told by your doctor. Avoid heavy or fried foods that are hard to digest. Activity  Rest as told by your doctor.  Do not sit for a long time without moving. Get up to take short walks every 1-2 hours. This is important. Ask for help if you feel weak or unsteady.  Return to your normal activities as told by your doctor. Ask your doctor what activities are safe for you. To help cramping and bloating:   Try walking around.  Put heat on your belly as told by your doctor. Use the heat source that your doctor recommends, such as a moist heat pack  or a heating pad. ? Put a towel between your skin and the heat source. ? Leave the heat on for 20-30 minutes. ? Remove the heat if your skin turns bright red. This is very important if you are unable to feel pain, heat, or cold. You may have a greater risk of getting burned. General instructions  For the first 24 hours after the procedure: ? Do not drive or use machinery. ? Do not sign important documents. ? Do not drink alcohol. ? Do your daily activities more  slowly than normal. ? Eat foods that are soft and easy to digest.  Take over-the-counter or prescription medicines only as told by your doctor.  Keep all follow-up visits as told by your doctor. This is important. Contact a doctor if:  You have blood in your poop 2-3 days after the procedure. Get help right away if:  You have more than a small amount of blood in your poop.  You see large clumps of tissue (blood clots) in your poop.  Your belly is swollen.  You feel like you may vomit (nauseous).  You vomit.  You have a fever.  You have belly pain that gets worse, and medicine does not help your pain. Summary  After the procedure, it is common to have a small amount of blood in your poop. You may also have mild cramping and bloating in your belly.  For the first 24 hours after the procedure, do not drive or use machinery, do not sign important documents, and do not drink alcohol.  Get help right away if you have a lot of blood in your poop, feel like you may vomit, have a fever, or have more belly pain. This information is not intended to replace advice given to you by your health care provider. Make sure you discuss any questions you have with your health care provider. Document Revised: 02/28/2019 Document Reviewed: 02/28/2019 Elsevier Patient Education  [place].    High-Fiber Diet Fiber, also called dietary fiber, is a type of carbohydrate that is found in fruits, vegetables, whole grains, and beans. A high-fiber diet can have many health benefits. Your health care provider may recommend a high-fiber diet to help:  Prevent constipation. Fiber can make your bowel movements more regular.  Lower your cholesterol.  Relieve the following conditions: ? Swelling of veins in the anus (hemorrhoids). ? Swelling and irritation (inflammation) of specific areas of the digestive tract (uncomplicated diverticulosis). ? A problem of the large intestine (colon) that  sometimes causes pain and diarrhea (irritable bowel syndrome, IBS).  Prevent overeating as part of a weight-loss plan.  Prevent heart disease, type 2 diabetes, and certain cancers. What is my plan? The recommended daily fiber intake in grams (g) includes:  38 g for men age 64 or younger.  30 g for men over age 33.  33 g for women age 43 or younger.  21 g for women over age 45. You can get the recommended daily intake of dietary fiber by:  Eating a variety of fruits, vegetables, grains, and beans.  Taking a fiber supplement, if it is not possible to get enough fiber through your diet. What do I need to know about a high-fiber diet?  It is better to get fiber through food sources rather than from fiber supplements. There is not a lot of research about how effective supplements are.  Always check the fiber content on the nutrition facts label of any prepackaged food. Look for foods  that contain 5 g of fiber or more per serving.  Talk with a diet and nutrition specialist (dietitian) if you have questions about specific foods that are recommended or not recommended for your medical condition, especially if those foods are not listed below.  Gradually increase how much fiber you consume. If you increase your intake of dietary fiber too quickly, you may have bloating, cramping, or gas.  Drink plenty of water. Water helps you to digest fiber. What are tips for following this plan?  Eat a wide variety of high-fiber foods.  Make sure that half of the grains that you eat each day are whole grains.  Eat breads and cereals that are made with whole-grain flour instead of refined flour or white flour.  Eat brown rice, bulgur wheat, or millet instead of white rice.  Start the day with a breakfast that is high in fiber, such as a cereal that contains 5 g of fiber or more per serving.  Use beans in place of meat in soups, salads, and pasta dishes.  Eat high-fiber snacks, such as berries,  raw vegetables, nuts, and popcorn.  Choose whole fruits and vegetables instead of processed forms like juice or sauce. What foods can I eat?  Fruits Berries. Pears. Apples. Oranges. Avocado. Prunes and raisins. Dried figs. Vegetables Sweet potatoes. Spinach. Kale. Artichokes. Cabbage. Broccoli. Cauliflower. Green peas. Carrots. Squash. Grains Whole-grain breads. Multigrain cereal. Oats and oatmeal. Brown rice. Barley. Bulgur wheat. [place]. Quinoa. Bran muffins. Popcorn. Rye wafer crackers. Meats and other proteins Navy, kidney, and pinto beans. Soybeans. Split peas. Lentils. Nuts and seeds. Dairy Fiber-fortified yogurt. Beverages Fiber-fortified soy milk. Fiber-fortified orange juice. Other foods Fiber bars. The items listed above may not be a complete list of recommended foods and beverages. Contact a dietitian for more options. What foods are not recommended? Fruits Fruit juice. Cooked, strained fruit. Vegetables Fried potatoes. Canned vegetables. Well-cooked vegetables. Grains White bread. Pasta made with refined flour. White rice. Meats and other proteins Fatty cuts of meat. Fried chicken or fried fish. Dairy Milk. Yogurt. Cream cheese. Sour cream. Fats and oils Butters. Beverages Soft drinks. Other foods Cakes and pastries. The items listed above may not be a complete list of foods and beverages to avoid. Contact a dietitian for more information. Summary  Fiber is a type of carbohydrate. It is found in fruits, vegetables, whole grains, and beans.  There are many health benefits of eating a high-fiber diet, such as preventing constipation, lowering blood cholesterol, helping with weight loss, and reducing your risk of heart disease, diabetes, and certain cancers.  Gradually increase your intake of fiber. Increasing too fast can result in cramping, bloating, and gas. Drink plenty of water while you increase your fiber.  The best sources of fiber include whole fruits  and vegetables, whole grains, nuts, seeds, and beans. This information is not intended to replace advice given to you by your health care provider. Make sure you discuss any questions you have with your health care provider. Document Revised: 06/08/2017 Document Reviewed: 06/08/2017 Elsevier Patient Education  2020 [employer].

## 2020-01-19 NOTE — Op Note (Signed)
[facility] Patient Name: Jerome Sanchez Procedure Date: 01/19/2020 1:08 PM MRN: [id number] Date of Birth: 08/18/1951 Attending MD: Hildred Laser , MD CSN: [id number] Age: 69 Admit Type: Outpatient Procedure:                Colonoscopy Indications:              Heme positive stool Providers:                Hildred Laser, MD, Janeece Riggers, RN, Nelma Rothman,                            Technician Referring MD:             Erven Colla, DO Medicines:                Midazolam 1 mg IV Complications:            No immediate complications. Estimated Blood Loss:     Estimated blood loss was minimal. Procedure:                Pre-Anesthesia Assessment:                           - Prior to the procedure, a History and Physical                            was performed, and patient medications and                            allergies were reviewed. The patient's tolerance of                            previous anesthesia was also reviewed. The risks                            and benefits of the procedure and the sedation                            options and risks were discussed with the patient.                            All questions were answered, and informed consent                            was obtained. Prior Anticoagulants: The patient has                            taken no previous anticoagulant or antiplatelet                            agents except for NSAID medication. ASA Grade                            Assessment: II - A patient with mild systemic  disease. After reviewing the risks and benefits,                            the patient was deemed in satisfactory condition to                            undergo the procedure.                           - Prior to the procedure, a History and Physical                            was performed, and patient medications and                            allergies were reviewed. The patient's tolerance of                         previous anesthesia was also reviewed. The risks                            and benefits of the procedure and the sedation                            options and risks were discussed with the patient.                            All questions were answered, and informed consent                            was obtained. After reviewing the risks and                            benefits, the patient was deemed in satisfactory                            condition to undergo the procedure.                           After obtaining informed consent, the colonoscope                            was passed under direct vision. Throughout the                            procedure, the patient's blood pressure, pulse, and                            oxygen saturations were monitored continuously. The                            [id number] scope was introduced through  the anus and advanced to the the cecum, identified                            by appendiceal orifice and ileocecal valve. The                            colonoscopy was performed without difficulty. The                            patient tolerated the procedure well. The quality                            of the bowel preparation was good except cecum,                            hepatic flexure and proximal transverse colon was                            fair. Scope In: 1:13:17 PM Scope Out: 1:37:26 PM Scope Withdrawal Time: 0 hours 14 minutes 49 seconds  Total Procedure Duration: 0 hours 24 minutes 9 seconds  Findings:      The perianal and digital rectal examinations were normal.      A small polyp was found in the splenic flexure. The polyp was removed       with a cold snare. Resection and retrieval were complete. The pathology       specimen was placed into Bottle Number 2.      Scattered diverticula were found in the sigmoid colon.      The retroflexed view of the distal rectum  and anal verge was normal and       showed no anal or rectal abnormalities. Impression:               - One small polyp at the splenic flexure, removed                            with a cold snare. Resected and retrieved.                           - Diverticulosis in the sigmoid colon. Moderate Sedation:      Moderate (conscious) sedation was administered by the endoscopy nurse       and supervised by the endoscopist. The following parameters were       monitored: oxygen saturation, heart rate, blood pressure, CO2       capnography and response to care. Total physician intraservice time was       30 minutes. Recommendation:           - Patient has a contact number available for                            emergencies. The signs and symptoms of potential                            delayed complications were discussed with the  patient. Return to normal activities tomorrow.                            Written discharge instructions were provided to the                            patient.                           - High fiber diet today.                           - Continue present medications.                           - No aspirin, ibuprofen, naproxen, or other                            non-steroidal anti-inflammatory drugs for 1 day.                           - Await pathology results.                           - Repeat colonoscopy is recommended. The                            colonoscopy date will be determined after pathology                            results from today's exam become available for                            review. Procedure Code(s):        --- Professional ---                           [phone], Colonoscopy, flexible; with removal of                            tumor(s), polyp(s), or other lesion(s) by snare                            technique                           [id number], Moderate sedation; each additional 15                             minutes intraservice time                           G0500, Moderate sedation services provided by the                            same physician or other qualified health care  professional performing a gastrointestinal                            endoscopic service that sedation supports,                            requiring the presence of an independent trained                            observer to assist in the monitoring of the                            patient's level of consciousness and physiological                            status; initial 15 minutes of intra-service time;                            patient age 48 years or older (additional time may                            be reported with [phone], as appropriate) Diagnosis Code(s):        --- Professional ---                           K63.5, Polyp of colon                           R19.5, Other fecal abnormalities                           K57.30, Diverticulosis of large intestine without                            perforation or abscess without bleeding CPT copyright 2019 American Medical Association. All rights reserved. The codes documented in this report are preliminary and upon coder review may  be revised to meet current compliance requirements. Hildred Laser, MD Hildred Laser, MD 01/19/2020 1:57:25 PM This report has been signed electronically. Number of Addenda: 0

## 2020-01-20 LAB — SURGICAL PATHOLOGY

## 2020-01-23 LAB — H. PYLORI ANTIBODY, IGG: H Pylori IgG: 0.71 Index Value (ref 0.00–0.79)

## 2020-01-26 ENCOUNTER — Encounter: Payer: Self-pay | Admitting: Family Medicine

## 2020-01-26 DIAGNOSIS — D126 Benign neoplasm of colon, unspecified: Secondary | ICD-10-CM | POA: Insufficient documentation

## 2020-01-26 DIAGNOSIS — K2270 Barrett's esophagus without dysplasia: Secondary | ICD-10-CM | POA: Insufficient documentation

## 2020-01-27 LAB — COMPREHENSIVE METABOLIC PANEL
ALT: 27 IU/L (ref 0–44)
AST: 26 IU/L (ref 0–40)
Albumin/Globulin Ratio: 1.8 (ref 1.2–2.2)
Albumin: 4.4 g/dL (ref 3.8–4.8)
Alkaline Phosphatase: 69 IU/L (ref 48–121)
BUN/Creatinine Ratio: 18 (ref 10–24)
BUN: 25 mg/dL (ref 8–27)
Bilirubin Total: 0.4 mg/dL (ref 0.0–1.2)
CO2: 26 mmol/L (ref 20–29)
Calcium: 9.3 mg/dL (ref 8.6–10.2)
Chloride: 102 mmol/L (ref 96–106)
Creatinine, Ser: 1.38 mg/dL — ABNORMAL HIGH (ref 0.76–1.27)
GFR calc Af Amer: 59 mL/min/{1.73_m2} — ABNORMAL LOW (ref >59)
GFR calc non Af Amer: 51 mL/min/{1.73_m2} — ABNORMAL LOW (ref >59)
Globulin, Total: 2.4 g/dL (ref 1.5–4.5)
Glucose: 116 mg/dL — ABNORMAL HIGH (ref 65–99)
Potassium: 4.6 mmol/L (ref 3.5–5.2)
Sodium: 141 mmol/L (ref 134–144)
Total Protein: 6.8 g/dL (ref 6.0–8.5)

## 2020-01-27 LAB — CBC WITH DIFFERENTIAL/PLATELET
Basophils Absolute: 0.1 10*3/uL (ref 0.0–0.2)
Basos: 1 %
EOS (ABSOLUTE): 0.4 10*3/uL (ref 0.0–0.4)
Eos: 6 %
Hematocrit: 44.8 % (ref 37.5–51.0)
Hemoglobin: 15.3 g/dL (ref 13.0–17.7)
Immature Grans (Abs): 0 10*3/uL (ref 0.0–0.1)
Immature Granulocytes: 1 %
Lymphocytes Absolute: 2.4 10*3/uL (ref 0.7–3.1)
Lymphs: 36 %
MCH: 32.8 pg (ref 26.6–33.0)
MCHC: 34.2 g/dL (ref 31.5–35.7)
MCV: 96 fL (ref 79–97)
Monocytes Absolute: 0.9 10*3/uL (ref 0.1–0.9)
Monocytes: 13 %
Neutrophils Absolute: 3 10*3/uL (ref 1.4–7.0)
Neutrophils: 43 %
Platelets: 275 10*3/uL (ref 150–450)
RBC: 4.66 x10E6/uL (ref 4.14–5.80)
RDW: 13 % (ref 11.6–15.4)
WBC: 6.8 10*3/uL (ref 3.4–10.8)

## 2020-01-27 LAB — LIPID PANEL
Chol/HDL Ratio: 2.3 ratio (ref 0.0–5.0)
Cholesterol, Total: 197 mg/dL (ref 100–199)
HDL: 84 mg/dL (ref >39)
LDL Chol Calc (NIH): 98 mg/dL (ref 0–99)
Triglycerides: 83 mg/dL (ref 0–149)
VLDL Cholesterol Cal: 15 mg/dL (ref 5–40)

## 2020-01-30 ENCOUNTER — Ambulatory Visit: Payer: Managed Care, Other (non HMO) | Admitting: Family Medicine

## 2020-02-03 ENCOUNTER — Ambulatory Visit (INDEPENDENT_AMBULATORY_CARE_PROVIDER_SITE_OTHER): Payer: Managed Care, Other (non HMO) | Admitting: Family Medicine

## 2020-02-03 ENCOUNTER — Other Ambulatory Visit: Payer: Self-pay

## 2020-02-03 ENCOUNTER — Encounter: Payer: Self-pay | Admitting: Family Medicine

## 2020-02-03 ENCOUNTER — Telehealth: Payer: Self-pay | Admitting: Family Medicine

## 2020-02-03 VITALS — BP 134/80 | HR 111 | Temp 97.4°F | Ht 70.0 in | Wt 218.0 lb

## 2020-02-03 DIAGNOSIS — R011 Cardiac murmur, unspecified: Secondary | ICD-10-CM | POA: Diagnosis not present

## 2020-02-03 DIAGNOSIS — E782 Mixed hyperlipidemia: Secondary | ICD-10-CM

## 2020-02-03 DIAGNOSIS — I10 Essential (primary) hypertension: Secondary | ICD-10-CM

## 2020-02-03 DIAGNOSIS — D126 Benign neoplasm of colon, unspecified: Secondary | ICD-10-CM | POA: Diagnosis not present

## 2020-02-03 DIAGNOSIS — R7301 Impaired fasting glucose: Secondary | ICD-10-CM

## 2020-02-03 MED ORDER — ENALAPRIL MALEATE 20 MG PO TABS: ORAL_TABLET | ORAL | 1 refills | Status: DC

## 2020-02-03 MED ORDER — ALBUTEROL SULFATE HFA 108 (90 BASE) MCG/ACT IN AERS: INHALATION_SPRAY | RESPIRATORY_TRACT | 5 refills | Status: DC

## 2020-02-03 MED ORDER — HYDROCHLOROTHIAZIDE 25 MG PO TABS: ORAL_TABLET | ORAL | 1 refills | Status: DC

## 2020-02-03 MED ORDER — ATORVASTATIN CALCIUM 80 MG PO TABS: 80.0000 mg | ORAL_TABLET | Freq: Every day | ORAL | 1 refills | Status: DC

## 2020-02-03 NOTE — Telephone Encounter (Signed)
Last labs done 01/26/20 cbc, lipid, cmp. Wants labs for his dec appt

## 2020-02-03 NOTE — Telephone Encounter (Signed)
Patient has physical on 07/30/20 and needing labs done.

## 2020-02-03 NOTE — Progress Notes (Signed)
Patient ID: Jerome Sanchez, male    DOB: 1950/10/18, 69 y.o.   MRN: [id number]   Chief Complaint  Patient presents with  . Hyperlipidemia    Pt here today for 6 month follow up. pt states he is doing well on medications.    Subjective:    HPI Had colonoscopy and egd on 01/19/20.  Had polyp and removed.  Return 7 yrs. With Dr. Laural Sanchez.  Has occ leg cramping.  Taking potassium occ hand numbness at night. Has wrist splints at night and helps thinks has carpal tunnel.  HLD- doing well, no chest pain, joint pains, or leg pains.  Copd- stable, no issues with breathing at this time  HTN- doing well with medications.  No chest pain, sob, lower leg swelling, headache or dizziness.  Medical History Jerome Sanchez has a past medical history of Essential hypertension, benign, Impaired glucose tolerance, Injury of right rotator cuff, Lumbar disc disease, and Mixed hyperlipidemia.   Outpatient Encounter Medications as of 02/03/2020  Medication Sig  . albuterol (VENTOLIN HFA) 108 (90 Base) MCG/ACT inhaler Inhale 1-2 puffs into the lungs q 6 hrs prn wheezing or shortness of breath  . atorvastatin (LIPITOR) 80 MG tablet Take 1 tablet (80 mg total) by mouth daily.  . enalapril (VASOTEC) 20 MG tablet TAKE 1 TABLET BY MOUTH TWICE DAILY  . hydrochlorothiazide (HYDRODIURIL) 25 MG tablet Take one tablet po daily  . lansoprazole (PREVACID) 15 MG capsule Take 1 capsule (15 mg total) by mouth as needed.  . naproxen sodium (ANAPROX) 220 MG tablet Take 220 mg by mouth as needed.  Marland Kitchen OVER THE COUNTER MEDICATION Areds ( omega 3, lutein, zeaxanthin) take one a day  . OVER THE COUNTER MEDICATION Zyflamend - Combination of herbal supplement - patient takes 1 daily.  . Potassium 99 MG TABS Take by mouth daily.  . [DISCONTINUED] albuterol (VENTOLIN HFA) 108 (90 Base) MCG/ACT inhaler Inhale 1-2 puffs into the lungs every 6 (six) hours as needed for wheezing or shortness of breath.  . [DISCONTINUED] atorvastatin (LIPITOR) 80  MG tablet Take 1 tablet (80 mg total) by mouth daily.  . [DISCONTINUED] enalapril (VASOTEC) 20 MG tablet TAKE 1 TABLET BY MOUTH TWICE DAILY  . [DISCONTINUED] hydrochlorothiazide (HYDRODIURIL) 25 MG tablet Take one tablet po daily   Facility-Administered Encounter Medications as of 02/03/2020  Medication  . methylPREDNISolone acetate (DEPO-MEDROL) injection 40 mg     Review of Systems  Constitutional: Negative for chills and fever.  HENT: Negative for congestion, rhinorrhea and sore throat.   Respiratory: Negative for cough, shortness of breath and wheezing.   Cardiovascular: Negative for chest pain and leg swelling.  Gastrointestinal: Negative for abdominal pain, diarrhea, nausea and vomiting.  Genitourinary: Negative for dysuria and frequency.  Skin: Negative for rash.  Neurological: Negative for dizziness, weakness and headaches.     Vitals BP 134/80   Pulse (!) 111   Temp (!) 97.4 F (36.3 C)   Ht 5\' 10"  (1.778 m)   Wt 218 lb (98.9 kg)   BMI 31.28 kg/m   Objective:   Physical Exam Vitals reviewed.  Constitutional:      General: He is not in acute distress.    Appearance: Normal appearance. He is not ill-appearing.  HENT:     Head: Normocephalic.     Nose: Nose normal. No congestion.     Mouth/Throat:     Mouth: Mucous membranes are moist.     Pharynx: No oropharyngeal exudate.  Eyes:  Extraocular Movements: Extraocular movements intact.     Conjunctiva/sclera: Conjunctivae normal.     Pupils: Pupils are equal, round, and reactive to light.  Cardiovascular:     Rate and Rhythm: Regular rhythm. Tachycardia present.     Pulses: Normal pulses.     Heart sounds: Murmur heard.   Pulmonary:     Effort: Pulmonary effort is normal.     Breath sounds: Normal breath sounds. No wheezing, rhonchi or rales.  Musculoskeletal:        General: Normal range of motion.     Right lower leg: No edema.     Left lower leg: No edema.  Skin:    General: Skin is warm and dry.      Findings: No rash.  Neurological:     General: No focal deficit present.     Mental Status: He is alert and oriented to person, place, and time.     Cranial Nerves: No cranial nerve deficit.  Psychiatric:        Mood and Affect: Mood normal.        Behavior: Behavior normal.      Assessment and Plan   1. Mixed hyperlipidemia  2. Heart murmur  3. Tubular adenoma of colon  4. Essential hypertension, benign  5. Impaired fasting glucose   Pt will set up eye exam soon, overdue.  htn- stable, cont meds, dec salt in diet and increase in exercising.  Slightly elevated Cr at 1.38.  Cont to monitor.  impaired fasting glucose- 118.  Last one in 2019- was at 108.  Will cont to monitor and dec carb intake and increase in exercising.  hld- labs normal. Cont meds.  On colonoscopy in 6/21- has tubular adenoma.  F/u 66mo.

## 2020-02-04 NOTE — Telephone Encounter (Signed)
Done

## 2020-02-06 NOTE — Telephone Encounter (Signed)
Pt.notified

## 2020-07-23 ENCOUNTER — Other Ambulatory Visit: Payer: Self-pay | Admitting: *Deleted

## 2020-07-23 DIAGNOSIS — E782 Mixed hyperlipidemia: Secondary | ICD-10-CM

## 2020-07-23 DIAGNOSIS — I10 Essential (primary) hypertension: Secondary | ICD-10-CM

## 2020-07-25 LAB — CBC
Hematocrit: 45.3 % (ref 37.5–51.0)
Hemoglobin: 15.8 g/dL (ref 13.0–17.7)
MCH: 33 pg (ref 26.6–33.0)
MCHC: 34.9 g/dL (ref 31.5–35.7)
MCV: 95 fL (ref 79–97)
Platelets: 274 10*3/uL (ref 150–450)
RBC: 4.79 x10E6/uL (ref 4.14–5.80)
RDW: 12.6 % (ref 11.6–15.4)
WBC: 7.2 10*3/uL (ref 3.4–10.8)

## 2020-07-25 LAB — CMP14+EGFR
ALT: 24 IU/L (ref 0–44)
AST: 28 IU/L (ref 0–40)
Albumin/Globulin Ratio: 1.6 (ref 1.2–2.2)
Albumin: 4.5 g/dL (ref 3.8–4.8)
Alkaline Phosphatase: 65 IU/L (ref 44–121)
BUN/Creatinine Ratio: 16 (ref 10–24)
BUN: 21 mg/dL (ref 8–27)
Bilirubin Total: 0.6 mg/dL (ref 0.0–1.2)
CO2: 24 mmol/L (ref 20–29)
Calcium: 9.5 mg/dL (ref 8.6–10.2)
Chloride: 99 mmol/L (ref 96–106)
Creatinine, Ser: 1.28 mg/dL — ABNORMAL HIGH (ref 0.76–1.27)
GFR calc Af Amer: 65 mL/min/{1.73_m2} (ref >59)
GFR calc non Af Amer: 56 mL/min/{1.73_m2} — ABNORMAL LOW (ref >59)
Globulin, Total: 2.8 g/dL (ref 1.5–4.5)
Glucose: 118 mg/dL — ABNORMAL HIGH (ref 65–99)
Potassium: 4.2 mmol/L (ref 3.5–5.2)
Sodium: 140 mmol/L (ref 134–144)
Total Protein: 7.3 g/dL (ref 6.0–8.5)

## 2020-07-25 LAB — LIPID PANEL
Chol/HDL Ratio: 2.4 ratio (ref 0.0–5.0)
Cholesterol, Total: 214 mg/dL — ABNORMAL HIGH (ref 100–199)
HDL: 88 mg/dL (ref >39)
LDL Chol Calc (NIH): 110 mg/dL — ABNORMAL HIGH (ref 0–99)
Triglycerides: 94 mg/dL (ref 0–149)
VLDL Cholesterol Cal: 16 mg/dL (ref 5–40)

## 2020-07-30 ENCOUNTER — Encounter: Payer: Self-pay | Admitting: Family Medicine

## 2020-07-30 ENCOUNTER — Ambulatory Visit (INDEPENDENT_AMBULATORY_CARE_PROVIDER_SITE_OTHER): Payer: Managed Care, Other (non HMO) | Admitting: Family Medicine

## 2020-07-30 ENCOUNTER — Other Ambulatory Visit: Payer: Self-pay

## 2020-07-30 VITALS — BP 138/88 | HR 86 | Temp 96.0°F | Ht 67.0 in | Wt 221.2 lb

## 2020-07-30 DIAGNOSIS — Z0000 Encounter for general adult medical examination without abnormal findings: Secondary | ICD-10-CM

## 2020-07-30 DIAGNOSIS — E782 Mixed hyperlipidemia: Secondary | ICD-10-CM | POA: Diagnosis not present

## 2020-07-30 DIAGNOSIS — R7301 Impaired fasting glucose: Secondary | ICD-10-CM

## 2020-07-30 DIAGNOSIS — I10 Essential (primary) hypertension: Secondary | ICD-10-CM | POA: Diagnosis not present

## 2020-07-30 DIAGNOSIS — J449 Chronic obstructive pulmonary disease, unspecified: Secondary | ICD-10-CM

## 2020-07-30 DIAGNOSIS — J4489 Other specified chronic obstructive pulmonary disease: Secondary | ICD-10-CM

## 2020-07-30 MED ORDER — ALBUTEROL SULFATE HFA 108 (90 BASE) MCG/ACT IN AERS: INHALATION_SPRAY | RESPIRATORY_TRACT | 5 refills | Status: DC

## 2020-07-30 MED ORDER — HYDROCHLOROTHIAZIDE 25 MG PO TABS: ORAL_TABLET | ORAL | 1 refills | Status: DC

## 2020-07-30 MED ORDER — LANSOPRAZOLE 15 MG PO CPDR: 15.0000 mg | DELAYED_RELEASE_CAPSULE | ORAL | 1 refills | Status: DC | PRN

## 2020-07-30 MED ORDER — ENALAPRIL MALEATE 20 MG PO TABS: ORAL_TABLET | ORAL | 1 refills | Status: DC

## 2020-07-30 MED ORDER — ATORVASTATIN CALCIUM 80 MG PO TABS: 80.0000 mg | ORAL_TABLET | Freq: Every day | ORAL | 1 refills | Status: DC

## 2020-07-30 NOTE — Progress Notes (Signed)
Patient ID: Jerome Sanchez, male    DOB: 1950/09/18, 69 y.o.   MRN: [id number]   Chief Complaint  Patient presents with  . Annual Exam   Subjective:    HPI AWV- Annual Wellness Visit  The patient was seen for their annual wellness visit. The patient's past medical history, surgical history, and family history were reviewed. Pertinent vaccines were reviewed ( tetanus, pneumonia, shingles, flu) The patient's medication list was reviewed and updated.  The height and weight were entered.  BMI recorded in electronic record elsewhere  Cognitive screening was completed. Outcome of Mini - Cog: pass   Falls /depression screening electronically recorded within record elsewhere  Current tobacco usage: none (All patients who use tobacco were given written and verbal information on quitting)  Recent listing of emergency department/hospitalizations over the past year were reviewed.  current specialist the patient sees on a regular basis: none   Medicare annual wellness visit patient questionnaire was reviewed.  A written screening schedule for the patient for the next 5-10 years was given. Appropriate discussion of followup regarding next visit was discussed.  Pt is going to retire 2/22.  Pt wanting to increase in exercising.  Doing mostly desk work and in quality.   Taking the prevacid every other day and keeping it controlled.   Glucose 118.  Cr 1.28. Cholesterol- 214, ldl- 110. HDL- 88.   Has eye doctor but is over due for exam.  Didn't get one in 2020. Foot exam- no concerns. Return 7 yrs for colonoscopy, 2028.  As a child was dx with childhood diabetes. Then stopped them meds for diabetes when was a child.  GF was diabetic and didn't do diet modifications. Former smoker- quit smoking 10 yrs ago, heavy. Dropped down to 1 pack per week.  2 yrs - quit.   Medical History Vinson has a past medical history of Essential hypertension, benign, Impaired glucose tolerance,  Injury of right rotator cuff, Lumbar disc disease, and Mixed hyperlipidemia.   Outpatient Encounter Medications as of 07/30/2020  Medication Sig  . naproxen sodium (ANAPROX) 220 MG tablet Take 220 mg by mouth as needed.  Marland Kitchen OVER THE COUNTER MEDICATION Areds ( omega 3, lutein, zeaxanthin) take one a day  . OVER THE COUNTER MEDICATION Zyflamend - Combination of herbal supplement - patient takes 1 daily.  . Potassium 99 MG TABS Take by mouth daily.  . [DISCONTINUED] albuterol (VENTOLIN HFA) 108 (90 Base) MCG/ACT inhaler Inhale 1-2 puffs into the lungs q 6 hrs prn wheezing or shortness of breath  . [DISCONTINUED] atorvastatin (LIPITOR) 80 MG tablet Take 1 tablet (80 mg total) by mouth daily.  . [DISCONTINUED] enalapril (VASOTEC) 20 MG tablet TAKE 1 TABLET BY MOUTH TWICE DAILY  . [DISCONTINUED] hydrochlorothiazide (HYDRODIURIL) 25 MG tablet Take one tablet po daily  . [DISCONTINUED] lansoprazole (PREVACID) 15 MG capsule Take 1 capsule (15 mg total) by mouth as needed.  Marland Kitchen albuterol (VENTOLIN HFA) 108 (90 Base) MCG/ACT inhaler Inhale 1-2 puffs into the lungs q 6 hrs prn wheezing or shortness of breath  . atorvastatin (LIPITOR) 80 MG tablet Take 1 tablet (80 mg total) by mouth daily.  . enalapril (VASOTEC) 20 MG tablet TAKE 1 TABLET BY MOUTH TWICE DAILY  . hydrochlorothiazide (HYDRODIURIL) 25 MG tablet Take one tablet po daily  . lansoprazole (PREVACID) 15 MG capsule Take 1 capsule (15 mg total) by mouth as needed.   Facility-Administered Encounter Medications as of 07/30/2020  Medication  . methylPREDNISolone acetate (DEPO-MEDROL) injection  40 mg     Review of Systems  Constitutional: Negative for chills and fever.  HENT: Negative for congestion, rhinorrhea and sore throat.   Respiratory: Negative for cough, shortness of breath and wheezing.   Cardiovascular: Negative for chest pain and leg swelling.  Gastrointestinal: Negative for abdominal pain, diarrhea, nausea and vomiting.  Genitourinary:  Negative for dysuria and frequency.  Skin: Negative for rash.  Neurological: Negative for dizziness, weakness and headaches.     Vitals BP 138/88   Pulse 86   Temp (!) 96 F (35.6 C)   Ht '5\' 7"'  (1.702 m)   Wt 221 lb 3.2 oz (100.3 kg)   SpO2 94%   BMI 34.64 kg/m   Objective:   Physical Exam Vitals and nursing note reviewed.  Constitutional:      General: He is not in acute distress.    Appearance: Normal appearance. He is not ill-appearing.  HENT:     Head: Normocephalic.     Nose: Nose normal. No congestion.     Mouth/Throat:     Mouth: Mucous membranes are moist.     Pharynx: No oropharyngeal exudate.  Eyes:     Extraocular Movements: Extraocular movements intact.     Conjunctiva/sclera: Conjunctivae normal.     Pupils: Pupils are equal, round, and reactive to light.  Cardiovascular:     Rate and Rhythm: Normal rate and regular rhythm.     Pulses: Normal pulses.     Heart sounds: Normal heart sounds. No murmur heard.   Pulmonary:     Effort: Pulmonary effort is normal.     Breath sounds: Normal breath sounds. No wheezing, rhonchi or rales.  Musculoskeletal:        General: Normal range of motion.     Right lower leg: No edema.     Left lower leg: No edema.  Skin:    General: Skin is warm and dry.     Findings: No rash.  Neurological:     General: No focal deficit present.     Mental Status: He is alert and oriented to person, place, and time.     Cranial Nerves: No cranial nerve deficit.  Psychiatric:        Mood and Affect: Mood normal.        Behavior: Behavior normal.        Thought Content: Thought content normal.        Judgment: Judgment normal.      Assessment and Plan   1. Medicare annual wellness visit, subsequent  2. Essential hypertension, benign  3. Mixed hyperlipidemia - CMP14+EGFR - Lipid panel  4. 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  Will  cont to watch salt in diet and recheck on next visit.  Cont meds.  hld- stable. Cont meds.  Impaired fasting bg- stable. Slight elevation. Recheck on next visit.  Copd- stable, will refill inhalers.  HM- pt needing to schedule eye exam.  Pt stating he will do it.  Pt has had covid vaccine x2.  Needing booster. colonoscopy utd.  F/u [place] prn.

## 2021-01-24 DIAGNOSIS — R7303 Prediabetes: Secondary | ICD-10-CM

## 2021-01-24 DIAGNOSIS — Z79899 Other long term (current) drug therapy: Secondary | ICD-10-CM

## 2021-01-24 DIAGNOSIS — E782 Mixed hyperlipidemia: Secondary | ICD-10-CM

## 2021-01-24 DIAGNOSIS — Z125 Encounter for screening for malignant neoplasm of prostate: Secondary | ICD-10-CM

## 2021-01-24 DIAGNOSIS — I10 Essential (primary) hypertension: Secondary | ICD-10-CM

## 2021-01-28 ENCOUNTER — Ambulatory Visit: Payer: Managed Care, Other (non HMO) | Admitting: Family Medicine

## 2021-01-29 DIAGNOSIS — Z125 Encounter for screening for malignant neoplasm of prostate: Secondary | ICD-10-CM | POA: Diagnosis not present

## 2021-01-29 DIAGNOSIS — I10 Essential (primary) hypertension: Secondary | ICD-10-CM | POA: Diagnosis not present

## 2021-01-29 DIAGNOSIS — E782 Mixed hyperlipidemia: Secondary | ICD-10-CM | POA: Diagnosis not present

## 2021-01-29 DIAGNOSIS — R7303 Prediabetes: Secondary | ICD-10-CM | POA: Diagnosis not present

## 2021-01-29 DIAGNOSIS — Z79899 Other long term (current) drug therapy: Secondary | ICD-10-CM | POA: Diagnosis not present

## 2021-01-30 LAB — CMP14+EGFR
ALT: 27 IU/L (ref 0–44)
AST: 32 IU/L (ref 0–40)
Albumin/Globulin Ratio: 1.9 (ref 1.2–2.2)
Albumin: 4.7 g/dL (ref 3.7–4.7)
Alkaline Phosphatase: 75 IU/L (ref 44–121)
BUN/Creatinine Ratio: 21 (ref 10–24)
BUN: 26 mg/dL (ref 8–27)
Bilirubin Total: 0.5 mg/dL (ref 0.0–1.2)
CO2: 24 mmol/L (ref 20–29)
Calcium: 10.3 mg/dL — ABNORMAL HIGH (ref 8.6–10.2)
Chloride: 99 mmol/L (ref 96–106)
Creatinine, Ser: 1.21 mg/dL (ref 0.76–1.27)
Globulin, Total: 2.5 g/dL (ref 1.5–4.5)
Glucose: 120 mg/dL — ABNORMAL HIGH (ref 65–99)
Potassium: 5.1 mmol/L (ref 3.5–5.2)
Sodium: 139 mmol/L (ref 134–144)
Total Protein: 7.2 g/dL (ref 6.0–8.5)
eGFR: 64 mL/min/{1.73_m2} (ref >59)

## 2021-01-30 LAB — LIPID PANEL
Chol/HDL Ratio: 2.3 ratio (ref 0.0–5.0)
Cholesterol, Total: 213 mg/dL — ABNORMAL HIGH (ref 100–199)
HDL: 92 mg/dL (ref >39)
LDL Chol Calc (NIH): 104 mg/dL — ABNORMAL HIGH (ref 0–99)
Triglycerides: 99 mg/dL (ref 0–149)
VLDL Cholesterol Cal: 17 mg/dL (ref 5–40)

## 2021-01-30 LAB — HEMOGLOBIN A1C
Est. average glucose Bld gHb Est-mCnc: 128 mg/dL
Hgb A1c MFr Bld: 6.1 % — ABNORMAL HIGH (ref 4.8–5.6)

## 2021-01-30 LAB — PSA: Prostate Specific Ag, Serum: 1.7 ng/mL (ref 0.0–4.0)

## 2021-02-04 ENCOUNTER — Other Ambulatory Visit: Payer: Self-pay

## 2021-02-04 ENCOUNTER — Ambulatory Visit (INDEPENDENT_AMBULATORY_CARE_PROVIDER_SITE_OTHER): Payer: BC Managed Care – PPO | Admitting: Family Medicine

## 2021-02-04 VITALS — BP 137/87 | HR 80 | Temp 97.7°F | Ht 67.0 in | Wt 215.2 lb

## 2021-02-04 DIAGNOSIS — I10 Essential (primary) hypertension: Secondary | ICD-10-CM

## 2021-02-04 DIAGNOSIS — E782 Mixed hyperlipidemia: Secondary | ICD-10-CM | POA: Diagnosis not present

## 2021-02-04 DIAGNOSIS — K219 Gastro-esophageal reflux disease without esophagitis: Secondary | ICD-10-CM

## 2021-02-04 DIAGNOSIS — R7303 Prediabetes: Secondary | ICD-10-CM

## 2021-02-04 DIAGNOSIS — G5603 Carpal tunnel syndrome, bilateral upper limbs: Secondary | ICD-10-CM

## 2021-02-04 DIAGNOSIS — J441 Chronic obstructive pulmonary disease with (acute) exacerbation: Secondary | ICD-10-CM | POA: Diagnosis not present

## 2021-02-04 MED ORDER — ENALAPRIL MALEATE 20 MG PO TABS: ORAL_TABLET | ORAL | 1 refills | Status: DC

## 2021-02-04 MED ORDER — ATORVASTATIN CALCIUM 80 MG PO TABS: 80.0000 mg | ORAL_TABLET | Freq: Every day | ORAL | 1 refills | Status: DC

## 2021-02-04 MED ORDER — HYDROCHLOROTHIAZIDE 25 MG PO TABS: ORAL_TABLET | ORAL | 1 refills | Status: DC

## 2021-02-04 MED ORDER — ALBUTEROL SULFATE HFA 108 (90 BASE) MCG/ACT IN AERS: INHALATION_SPRAY | RESPIRATORY_TRACT | 5 refills | Status: DC

## 2021-02-04 MED ORDER — LANSOPRAZOLE 15 MG PO CPDR: 15.0000 mg | DELAYED_RELEASE_CAPSULE | ORAL | 1 refills | Status: DC | PRN

## 2021-02-04 NOTE — Progress Notes (Signed)
Patient ID: Jerome Sanchez, male    DOB: Dec 26, 1950, 70 y.o.   MRN: [id number]   Chief Complaint  Patient presents with   Hypertension    Follow up   Subjective:    HPI F/u htn-  No new concerns.  Slight increase in calcium on labs- Occ using tums if having flare of acid.  Not taking it daily.  Impaired fasting glucose/prediabetes- A1c 6.1., eating low carb diet. Getting some exercising in.  HLD- doing well no new concerns.  Compliant with meds. No chest pain, palpitations, myalgias or joint pains. Hld- doing well on atorvastatin.  Eating more ketogenic diet.  And trying to avoid sugar.   Medical History Jerome Sanchez has a past medical history of Essential hypertension, benign, Impaired glucose tolerance, Injury of right rotator cuff, Lumbar disc disease, and Mixed hyperlipidemia.   Outpatient Encounter Medications as of 02/04/2021  Medication Sig   albuterol (VENTOLIN HFA) 108 (90 Base) MCG/ACT inhaler Inhale 1-2 puffs into the lungs q 6 hrs prn wheezing or shortness of breath   atorvastatin (LIPITOR) 80 MG tablet Take 1 tablet (80 mg total) by mouth daily.   enalapril (VASOTEC) 20 MG tablet TAKE 1 TABLET BY MOUTH TWICE DAILY   hydrochlorothiazide (HYDRODIURIL) 25 MG tablet Take one tablet po daily   lansoprazole (PREVACID) 15 MG capsule Take 1 capsule (15 mg total) by mouth as needed.   naproxen sodium (ANAPROX) 220 MG tablet Take 220 mg by mouth as needed.   OVER THE COUNTER MEDICATION Areds ( omega 3, lutein, zeaxanthin) take one a day   OVER THE COUNTER MEDICATION Zyflamend - Combination of herbal supplement - patient takes 1 daily.   Potassium 99 MG TABS Take by mouth daily.   [DISCONTINUED] albuterol (VENTOLIN HFA) 108 (90 Base) MCG/ACT inhaler Inhale 1-2 puffs into the lungs q 6 hrs prn wheezing or shortness of breath   [DISCONTINUED] atorvastatin (LIPITOR) 80 MG tablet Take 1 tablet (80 mg total) by mouth daily.   [DISCONTINUED] enalapril (VASOTEC) 20 MG tablet TAKE 1  TABLET BY MOUTH TWICE DAILY   [DISCONTINUED] hydrochlorothiazide (HYDRODIURIL) 25 MG tablet Take one tablet po daily   [DISCONTINUED] lansoprazole (PREVACID) 15 MG capsule Take 1 capsule (15 mg total) by mouth as needed.   Facility-Administered Encounter Medications as of 02/04/2021  Medication   methylPREDNISolone acetate (DEPO-MEDROL) injection 40 mg     Review of Systems  Constitutional:  Negative for chills and fever.  HENT:  Negative for congestion, rhinorrhea and sore throat.   Respiratory:  Negative for cough, shortness of breath and wheezing.   Cardiovascular:  Negative for chest pain and leg swelling.  Gastrointestinal:  Negative for abdominal pain, diarrhea, nausea and vomiting.  Genitourinary:  Negative for dysuria and frequency.  Skin:  Negative for rash.  Neurological:  Negative for dizziness, weakness and headaches.    Vitals BP 137/87   Pulse 80   Temp 97.7 F (36.5 C) (Oral)   Ht 5\' 7"  (1.702 m)   Wt 215 lb 3.2 oz (97.6 kg)   SpO2 97%   BMI 33.71 kg/m   Objective:   Physical Exam Vitals and nursing note reviewed.  Constitutional:      General: He is not in acute distress.    Appearance: Normal appearance. He is not ill-appearing.  HENT:     Head: Normocephalic.     Nose: Nose normal. No congestion.     Mouth/Throat:     Mouth: Mucous membranes are moist.  Pharynx: No oropharyngeal exudate.  Eyes:     Extraocular Movements: Extraocular movements intact.     Conjunctiva/sclera: Conjunctivae normal.     Pupils: Pupils are equal, round, and reactive to light.  Cardiovascular:     Rate and Rhythm: Normal rate and regular rhythm.     Pulses: Normal pulses.     Heart sounds: Normal heart sounds. No murmur heard. Pulmonary:     Effort: Pulmonary effort is normal.     Breath sounds: Normal breath sounds. No wheezing, rhonchi or rales.  Musculoskeletal:        General: Normal range of motion.     Right lower leg: No edema.     Left lower leg: No  edema.  Skin:    General: Skin is warm and dry.     Findings: No rash.  Neurological:     General: No focal deficit present.     Mental Status: He is alert and oriented to person, place, and time.     Cranial Nerves: No cranial nerve deficit.  Psychiatric:        Mood and Affect: Mood normal.        Behavior: Behavior normal.        Thought Content: Thought content normal.        Judgment: Judgment normal.     Assessment and Plan   1. Prediabetes  2. Mixed hyperlipidemia - atorvastatin (LIPITOR) 80 MG tablet; Take 1 tablet (80 mg total) by mouth daily.  Dispense: 90 tablet; Refill: 1  3. COPD exacerbation (HCC) - albuterol (VENTOLIN HFA) 108 (90 Base) MCG/ACT inhaler; Inhale 1-2 puffs into the lungs q 6 hrs prn wheezing or shortness of breath  Dispense: 18 g; Refill: 5  4. Gastroesophageal reflux disease without esophagitis - lansoprazole (PREVACID) 15 MG capsule; Take 1 capsule (15 mg total) by mouth as needed.  Dispense: 90 capsule; Refill: 1  5. Essential hypertension, benign - hydrochlorothiazide (HYDRODIURIL) 25 MG tablet; Take one tablet po daily  Dispense: 90 tablet; Refill: 1 - enalapril (VASOTEC) 20 MG tablet; TAKE 1 TABLET BY MOUTH TWICE DAILY  Dispense: 180 tablet; Refill: 1  6. Carpal tunnel syndrome, bilateral  7. Hypercalcemia   Prediab- a1c at 6.1. Overdue with eye doctor.  Pt slight elevated glucose.  A1c 6.1. Eating low sugar diet.  Eating about 90% keto diet.  Bilateral carpal tunnel- used to wear braces, but got bruising on the arms, so recommending a padded brace seen online for night time splinting. Not wanting to see ortho yet, not feeling it's that bad.  Resolving when repositioning wrists and not having daytime symptoms.  Hld- stable. Cont meds.  Htn- stable. Cont meds.  Dec salt in diet cont with exercising.  Hypercalcemia- slight increase in calcium at 10.3.  cont to montior and recheck next visit.  Return in about 6 months (around  08/06/2021) for f/u htn, hld, prediab.  BP Readings from Last 3 Encounters:  02/04/21 137/87  07/30/20 138/88  02/03/20 134/80

## 2021-05-07 ENCOUNTER — Other Ambulatory Visit: Payer: Self-pay | Admitting: *Deleted

## 2021-05-07 DIAGNOSIS — E782 Mixed hyperlipidemia: Secondary | ICD-10-CM

## 2021-05-07 MED ORDER — ATORVASTATIN CALCIUM 80 MG PO TABS: 80.0000 mg | ORAL_TABLET | Freq: Every day | ORAL | 0 refills | Status: DC

## 2021-08-06 ENCOUNTER — Telehealth: Payer: Self-pay | Admitting: Family Medicine

## 2021-08-06 DIAGNOSIS — R7303 Prediabetes: Secondary | ICD-10-CM

## 2021-08-06 DIAGNOSIS — E782 Mixed hyperlipidemia: Secondary | ICD-10-CM

## 2021-08-06 DIAGNOSIS — Z79899 Other long term (current) drug therapy: Secondary | ICD-10-CM

## 2021-08-06 NOTE — Telephone Encounter (Signed)
Patient needs labs for physical

## 2021-08-07 NOTE — Telephone Encounter (Signed)
Thersa Salt G, DO    Please order CBC, CMP w/ GFR, A1C, Lipid panel.  Thank you

## 2021-08-07 NOTE — Telephone Encounter (Signed)
Blood work ordered in Epic. Patient notified. 

## 2021-08-22 DIAGNOSIS — R7303 Prediabetes: Secondary | ICD-10-CM | POA: Diagnosis not present

## 2021-08-22 DIAGNOSIS — E782 Mixed hyperlipidemia: Secondary | ICD-10-CM | POA: Diagnosis not present

## 2021-08-22 DIAGNOSIS — Z79899 Other long term (current) drug therapy: Secondary | ICD-10-CM | POA: Diagnosis not present

## 2021-08-23 LAB — HEMOGLOBIN A1C
Est. average glucose Bld gHb Est-mCnc: 128 mg/dL
Hgb A1c MFr Bld: 6.1 % — ABNORMAL HIGH (ref 4.8–5.6)

## 2021-08-23 LAB — CBC WITH DIFFERENTIAL/PLATELET
Basophils Absolute: 0.1 10*3/uL (ref 0.0–0.2)
Basos: 1 %
EOS (ABSOLUTE): 0.5 10*3/uL — ABNORMAL HIGH (ref 0.0–0.4)
Eos: 5 %
Hematocrit: 48.4 % (ref 37.5–51.0)
Hemoglobin: 16 g/dL (ref 13.0–17.7)
Immature Grans (Abs): 0.1 10*3/uL (ref 0.0–0.1)
Immature Granulocytes: 1 %
Lymphocytes Absolute: 2.3 10*3/uL (ref 0.7–3.1)
Lymphs: 24 %
MCH: 31.8 pg (ref 26.6–33.0)
MCHC: 33.1 g/dL (ref 31.5–35.7)
MCV: 96 fL (ref 79–97)
Monocytes Absolute: 1.2 10*3/uL — ABNORMAL HIGH (ref 0.1–0.9)
Monocytes: 13 %
Neutrophils Absolute: 5.6 10*3/uL (ref 1.4–7.0)
Neutrophils: 56 %
Platelets: 274 10*3/uL (ref 150–450)
RBC: 5.03 x10E6/uL (ref 4.14–5.80)
RDW: 12.8 % (ref 11.6–15.4)
WBC: 9.8 10*3/uL (ref 3.4–10.8)

## 2021-08-23 LAB — LIPID PANEL
Chol/HDL Ratio: 2.8 ratio (ref 0.0–5.0)
Cholesterol, Total: 219 mg/dL — ABNORMAL HIGH (ref 100–199)
HDL: 79 mg/dL (ref >39)
LDL Chol Calc (NIH): 117 mg/dL — ABNORMAL HIGH (ref 0–99)
Triglycerides: 132 mg/dL (ref 0–149)
VLDL Cholesterol Cal: 23 mg/dL (ref 5–40)

## 2021-08-23 LAB — COMPREHENSIVE METABOLIC PANEL
ALT: 26 IU/L (ref 0–44)
AST: 29 IU/L (ref 0–40)
Albumin/Globulin Ratio: 1.5 (ref 1.2–2.2)
Albumin: 4.5 g/dL (ref 3.7–4.7)
Alkaline Phosphatase: 74 IU/L (ref 44–121)
BUN/Creatinine Ratio: 18 (ref 10–24)
BUN: 24 mg/dL (ref 8–27)
Bilirubin Total: 0.5 mg/dL (ref 0.0–1.2)
CO2: 26 mmol/L (ref 20–29)
Calcium: 9.5 mg/dL (ref 8.6–10.2)
Chloride: 101 mmol/L (ref 96–106)
Creatinine, Ser: 1.31 mg/dL — ABNORMAL HIGH (ref 0.76–1.27)
Globulin, Total: 3 g/dL (ref 1.5–4.5)
Glucose: 115 mg/dL — ABNORMAL HIGH (ref 70–99)
Potassium: 4.9 mmol/L (ref 3.5–5.2)
Sodium: 141 mmol/L (ref 134–144)
Total Protein: 7.5 g/dL (ref 6.0–8.5)
eGFR: 58 mL/min/{1.73_m2} — ABNORMAL LOW (ref >59)

## 2021-08-27 ENCOUNTER — Other Ambulatory Visit: Payer: Self-pay

## 2021-08-27 ENCOUNTER — Ambulatory Visit (INDEPENDENT_AMBULATORY_CARE_PROVIDER_SITE_OTHER): Payer: Medicare Other | Admitting: Family Medicine

## 2021-08-27 ENCOUNTER — Encounter: Payer: Self-pay | Admitting: Family Medicine

## 2021-08-27 VITALS — BP 146/92 | HR 94 | Temp 97.7°F | Ht 67.0 in | Wt 223.0 lb

## 2021-08-27 DIAGNOSIS — I10 Essential (primary) hypertension: Secondary | ICD-10-CM

## 2021-08-27 DIAGNOSIS — E782 Mixed hyperlipidemia: Secondary | ICD-10-CM | POA: Diagnosis not present

## 2021-08-27 DIAGNOSIS — Z0001 Encounter for general adult medical examination with abnormal findings: Secondary | ICD-10-CM | POA: Diagnosis not present

## 2021-08-27 DIAGNOSIS — J449 Chronic obstructive pulmonary disease, unspecified: Secondary | ICD-10-CM

## 2021-08-27 DIAGNOSIS — Z23 Encounter for immunization: Secondary | ICD-10-CM | POA: Diagnosis not present

## 2021-08-27 DIAGNOSIS — Z0000 Encounter for general adult medical examination without abnormal findings: Secondary | ICD-10-CM | POA: Insufficient documentation

## 2021-08-27 MED ORDER — ENALAPRIL MALEATE 20 MG PO TABS: ORAL_TABLET | ORAL | 3 refills | Status: DC

## 2021-08-27 MED ORDER — TRELEGY ELLIPTA 100-62.5-25 MCG/ACT IN AEPB: 1.0000 | INHALATION_SPRAY | Freq: Every day | RESPIRATORY_TRACT | 3 refills | Status: DC

## 2021-08-27 MED ORDER — HYDROCHLOROTHIAZIDE 25 MG PO TABS: ORAL_TABLET | ORAL | 3 refills | Status: DC

## 2021-08-27 MED ORDER — ATORVASTATIN CALCIUM 80 MG PO TABS: 80.0000 mg | ORAL_TABLET | Freq: Every day | ORAL | 3 refills | Status: DC

## 2021-08-27 NOTE — Assessment & Plan Note (Signed)
Patient experiencing ongoing shortness of breath and wheezing.  This is worsening.  Uncontrolled.  Adding Trelegy.  Albuterol as needed.

## 2021-08-27 NOTE — Patient Instructions (Signed)
Medication as prescribed.  Follow up in 6 months.  Take care  Dr. Maymunah Stegemann 

## 2021-08-27 NOTE — Progress Notes (Signed)
Subjective:  Patient ID: Jerome Sanchez, male    DOB: November 28, 1950  Age: 71 y.o. MRN: [id number]  CC: Annual physical  HPI Jerome Sanchez is a 71 y.o. male presents to the clinic today for an annual physical exam.  Preventative Healthcare Colonoscopy: Up to date. Immunizations Tetanus - Due; advised the patient to get this at his local pharmacy. Pneumococcal - Up to date. Flu - Flu vaccine today. Shingles - Due; advised patient to get this at his local pharmacy if he desires. Hepatitis C screening - Declines. Labs: Reviewed recent labs with patient. Exercise: No. Alcohol use: See below. Smoking/tobacco use: Former. Started @ 15; Quit 2017. 1-2 ppd. Regular dental exams: Yes.  Shortness of breath Patient reports worsening shortness of breath over the past 6 months.  Worse with exertion.  States that he uses albuterol approximately 2 times a week.  Helps some.  History reflects that he has COPD.  He is not on any controller medications.  No chest pain.  He would like to discuss what to do regarding his worsening shortness of breath despite use of albuterol.  Has never seen pulmonology.  No other complaints at this time.   History reviewed and updated as below.  Patient Active Problem List   Diagnosis Date Noted   Annual physical exam 08/27/2021   Heart murmur 02/03/2020   Tubular adenoma of colon 01/26/2020   Barrett's esophagus 01/26/2020   COPD (chronic obstructive pulmonary disease) ([place]) 07/27/2018   Esophageal reflux 01/07/2016   Mixed hyperlipidemia 08/05/2012   Carpal tunnel syndrome, bilateral 10/26/2008   Essential hypertension, benign 10/26/2008    Past Surgical History:  Procedure Laterality Date   BIOPSY  01/19/2020   Procedure: BIOPSY;  Surgeon: Rogene Houston, MD;  [facility]: [facility];  Service: Endoscopy;;   COLONOSCOPY N/A 01/19/2020   Procedure: COLONOSCOPY;  Surgeon: Rogene Houston, MD;  Location: [facility];  Service: Endoscopy;  Laterality: N/A;    ESOPHAGOGASTRODUODENOSCOPY N/A 01/19/2020   Procedure: ESOPHAGOGASTRODUODENOSCOPY (EGD);  Surgeon: Rogene Houston, MD;  Location: [facility];  Service: Endoscopy;  Laterality: N/A;  1200   POLYPECTOMY  01/19/2020   Procedure: POLYPECTOMY;  Surgeon: Rogene Houston, MD;  Location: [facility];  Service: Endoscopy;;   Family History  Problem Relation Age of Onset   Hypertension Sister    Diabetes type II Father    Social History   Tobacco Use   Smoking status: Former    Types: Cigarettes   Smokeless tobacco: Never  Substance Use Topics   Alcohol use: Yes    Comment: 1-2 glasses per night    Review of Systems  Constitutional: Negative.   Respiratory:  Positive for shortness of breath and wheezing.    Objective:   Today's Vitals: BP (!) 146/92    Pulse 94    Temp 97.7 F (36.5 C)    Ht 5\' 7"  (1.702 m)    Wt 223 lb (101.2 kg)    SpO2 94%    BMI 34.93 kg/m   Physical Exam Vitals and nursing note reviewed.  Constitutional:      General: He is not in acute distress.    Appearance: Normal appearance. He is obese. He is not ill-appearing.  HENT:     Head: Normocephalic and atraumatic.     Right Ear: Tympanic membrane normal.     Left Ear: Tympanic membrane normal.  Eyes:     General:  Right eye: No discharge.        Left eye: No discharge.     Conjunctiva/sclera: Conjunctivae normal.  Cardiovascular:     Rate and Rhythm: Normal rate and regular rhythm.     Heart sounds: Murmur heard.  Pulmonary:     Effort: Pulmonary effort is normal.     Breath sounds: No wheezing, rhonchi or rales.  Abdominal:     General: There is no distension.     Palpations: Abdomen is soft.     Tenderness: There is no abdominal tenderness.  Neurological:     Mental Status: He is alert.  Psychiatric:        Mood and Affect: Mood normal.        Behavior: Behavior normal.     Assessment & Plan:   Problem List Items Addressed This Visit       Cardiovascular and Mediastinum    Essential hypertension, benign   Relevant Medications   atorvastatin (LIPITOR) 80 MG tablet   enalapril (VASOTEC) 20 MG tablet   hydrochlorothiazide (HYDRODIURIL) 25 MG tablet     Respiratory   COPD (chronic obstructive pulmonary disease) ([place])    Patient experiencing ongoing shortness of breath and wheezing.  This is worsening.  Uncontrolled.  Adding Trelegy.  Albuterol as needed.      Relevant Medications   Fluticasone-Umeclidin-Vilant (TRELEGY ELLIPTA) 100-62.5-25 MCG/ACT AEPB     Other   Mixed hyperlipidemia   Relevant Medications   atorvastatin (LIPITOR) 80 MG tablet   enalapril (VASOTEC) 20 MG tablet   hydrochlorothiazide (HYDRODIURIL) 25 MG tablet   Annual physical exam - Primary    Preventative measures discussed and updated in the EMR. Patient is due for tetanus, flu vaccine, and shingles vaccine.  Flu vaccine today.  He will consider tetanus and shingles vaccine (can get at his pharmacy). Declines hep C screening.  Discussed lung cancer screening.  He will consider.      Other Visit Diagnoses     Immunization due           Meds ordered this encounter  Medications   atorvastatin (LIPITOR) 80 MG tablet    Sig: Take 1 tablet (80 mg total) by mouth daily.    Dispense:  90 tablet    Refill:  3   enalapril (VASOTEC) 20 MG tablet    Sig: TAKE 1 TABLET BY MOUTH TWICE DAILY    Dispense:  180 tablet    Refill:  3   hydrochlorothiazide (HYDRODIURIL) 25 MG tablet    Sig: Take one tablet po daily    Dispense:  90 tablet    Refill:  3   Fluticasone-Umeclidin-Vilant (TRELEGY ELLIPTA) 100-62.5-25 MCG/ACT AEPB    Sig: Inhale 1 Dose into the lungs daily.    Dispense:  60 each    Refill:  3     Follow-up: Return in about 6 months (around 02/24/2022).  [place]

## 2021-08-27 NOTE — Assessment & Plan Note (Signed)
Preventative measures discussed and updated in the EMR. Patient is due for tetanus, flu vaccine, and shingles vaccine.  Flu vaccine today.  He will consider tetanus and shingles vaccine (can get at his pharmacy). Declines hep C screening.  Discussed lung cancer screening.  He will consider.

## 2021-11-08 ENCOUNTER — Telehealth: Payer: Self-pay | Admitting: Family Medicine

## 2021-11-08 NOTE — Telephone Encounter (Signed)
Left message for patient to call back and schedule Medicare Annual Wellness Visit (AWV) in office.  ? ?If unable to come into the office for AWV,  please offer to do virtually or by telephone. ? ?Last AWV: 07/30/2020 ? ?Please schedule at anytime with [facility] Advisor. ? ?30 minute appointment for Virtual or phone ? ?45 minute appointment for in office or Initial virtual/phone ? ?Any questions, please contact me at [phone]  ?  ?  ?

## 2021-11-15 DIAGNOSIS — H5213 Myopia, bilateral: Secondary | ICD-10-CM | POA: Diagnosis not present

## 2021-11-15 DIAGNOSIS — H35373 Puckering of macula, bilateral: Secondary | ICD-10-CM | POA: Diagnosis not present

## 2021-11-27 NOTE — Telephone Encounter (Signed)
Pt returned call. Pt had wellness exam in January 2023. Pt would like to opt out of AWV and just see provider in July.  ?

## 2021-12-21 ENCOUNTER — Other Ambulatory Visit: Payer: Self-pay | Admitting: Family Medicine

## 2022-01-28 ENCOUNTER — Other Ambulatory Visit: Payer: Self-pay | Admitting: Family Medicine

## 2022-02-24 ENCOUNTER — Ambulatory Visit: Payer: Medicare Other | Admitting: Family Medicine

## 2022-02-25 ENCOUNTER — Other Ambulatory Visit: Payer: Self-pay | Admitting: Family Medicine

## 2022-03-03 ENCOUNTER — Ambulatory Visit (INDEPENDENT_AMBULATORY_CARE_PROVIDER_SITE_OTHER): Payer: Medicare Other | Admitting: Family Medicine

## 2022-03-03 ENCOUNTER — Encounter: Payer: Self-pay | Admitting: Family Medicine

## 2022-03-03 VITALS — BP 132/78 | HR 86 | Temp 98.0°F | Wt 224.6 lb

## 2022-03-03 DIAGNOSIS — E782 Mixed hyperlipidemia: Secondary | ICD-10-CM

## 2022-03-03 DIAGNOSIS — Z130 Encounter for screening for diseases of the blood and blood-forming organs and certain disorders involving the immune mechanism: Secondary | ICD-10-CM

## 2022-03-03 DIAGNOSIS — R7303 Prediabetes: Secondary | ICD-10-CM

## 2022-03-03 DIAGNOSIS — I10 Essential (primary) hypertension: Secondary | ICD-10-CM

## 2022-03-03 DIAGNOSIS — Z87891 Personal history of nicotine dependence: Secondary | ICD-10-CM

## 2022-03-03 DIAGNOSIS — J449 Chronic obstructive pulmonary disease, unspecified: Secondary | ICD-10-CM

## 2022-03-03 NOTE — Patient Instructions (Signed)
Labs ordered.  CT lung cancer screening ordered.  Follow up in 6 months.

## 2022-03-04 ENCOUNTER — Encounter: Payer: Self-pay | Admitting: Family Medicine

## 2022-03-04 DIAGNOSIS — Z87891 Personal history of nicotine dependence: Secondary | ICD-10-CM | POA: Insufficient documentation

## 2022-03-04 DIAGNOSIS — R7303 Prediabetes: Secondary | ICD-10-CM | POA: Insufficient documentation

## 2022-03-04 NOTE — Assessment & Plan Note (Signed)
Hypertension at goal.  Continue enalapril and HCTZ.  Labs ordered.

## 2022-03-04 NOTE — Assessment & Plan Note (Signed)
Lipid panel ordered.  May need addition of Zetia.

## 2022-03-04 NOTE — Assessment & Plan Note (Signed)
Patient is a candidate for CT lung cancer screening.  He is amenable to this.  Ordered.

## 2022-03-04 NOTE — Assessment & Plan Note (Signed)
Stable on Trelegy.  Continue. 

## 2022-03-04 NOTE — Progress Notes (Signed)
Subjective:  Patient ID: Jerome Sanchez, male    DOB: 01-06-1951  Age: 71 y.o. MRN: [id number]  CC: Chief Complaint  Patient presents with   Hyperlipidemia   Hypertension    Has been a little elevated. Phelgm in bottom of lungs; Trelegy has helped but its still there. Phelgm tends to build up after pt has done some sort of exertion.     HPI:  71 year old male with COPD, history of tobacco abuse, hyperlipidemia, hypertension presents for follow-up.  Patient's hypertension is stable on enalapril and HCTZ.  Patient states that his COPD is stable currently.  He does well on Trelegy.  He occasionally has to use his albuterol inhaler.  Patient is a candidate for CT lung cancer screening.  We will discuss this today.  LDL has been persistently elevated despite being on max dose Lipitor.  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  Will order lipid panel today.  Patient Active Problem List   Diagnosis Date Noted   Prediabetes 03/04/2022   Former smoker 03/04/2022   Annual physical exam 08/27/2021   Tubular adenoma of colon 01/26/2020   Barrett's esophagus 01/26/2020   COPD (chronic obstructive pulmonary disease) ([place]) 07/27/2018   Mixed hyperlipidemia 08/05/2012   Essential hypertension, benign 10/26/2008    Social Hx   Social History   Socioeconomic History   Marital status: Married    Spouse name: Not on file   Number of children: Not on file   Years of education: Not on file   Highest education level: Not on file  Occupational History   Not on file  Tobacco Use   Smoking status: Former    Types: Cigarettes   Smokeless tobacco: Never  Vaping Use   Vaping Use: Never used  Substance and Sexual Activity   Alcohol use: Yes    Comment: 1-2 glasses per night   Drug use: No   Sexual activity: Not on file  Other Topics Concern   Not on file  Social History Narrative   Not on file   Social Determinants of Health   Financial Resource Strain: Not on file  Food  Insecurity: Not on file  Transportation Needs: Not on file  Physical Activity: Not on file  Stress: Not on file  Social Connections: Not on file    Review of Systems  Constitutional: Negative.   Respiratory:         SOB - Baseline.   Objective:  BP 132/78   Pulse 86   Temp 98 F (36.7 C)   Wt 224 lb 9.6 oz (101.9 kg)   SpO2 94%   BMI 35.18 kg/m      03/03/2022    2:17 PM 08/27/2021    2:22 PM 08/27/2021    1:38 PM  BP/Weight  Systolic BP 540 086 761  Diastolic BP 78 92 98  Wt. (Lbs) 224.6  223  BMI 35.18 kg/m2  34.93 kg/m2    Physical Exam Vitals and nursing note reviewed.  Constitutional:      General: He is not in acute distress.    Appearance: Normal appearance. He is not ill-appearing.  HENT:     Head: Normocephalic and atraumatic.  Eyes:     General:        Right eye: No discharge.        Left eye: No discharge.     Conjunctiva/sclera: Conjunctivae normal.  Cardiovascular:     Rate and Rhythm: Normal rate and regular rhythm.  Pulmonary:  Effort: Pulmonary effort is normal.     Breath sounds: Wheezing present.  Neurological:     Mental Status: He is alert.  Psychiatric:        Mood and Affect: Mood normal.        Behavior: Behavior normal.     Lab Results  Component Value Date   WBC 9.8 08/22/2021   HGB 16.0 08/22/2021   HCT 48.4 08/22/2021   PLT 274 08/22/2021   GLUCOSE 115 (H) 08/22/2021   CHOL 219 (H) 08/22/2021   TRIG 132 08/22/2021   HDL 79 08/22/2021   LDLCALC 117 (H) 08/22/2021   ALT 26 08/22/2021   AST 29 08/22/2021   NA 141 08/22/2021   K 4.9 08/22/2021   CL 101 08/22/2021   CREATININE 1.31 (H) 08/22/2021   BUN 24 08/22/2021   CO2 26 08/22/2021   PSA 0.66 06/27/2014   HGBA1C 6.1 (H) 08/22/2021     Assessment & Plan:   Problem List Items Addressed This Visit       Cardiovascular and Mediastinum   Essential hypertension, benign - Primary    Hypertension at goal.  Continue enalapril and HCTZ.  Labs ordered.       Relevant Orders   CMP14+EGFR     Respiratory   COPD (chronic obstructive pulmonary disease) ([place])    Stable on Trelegy.  Continue.      Relevant Orders   CT CHEST LUNG CA SCREEN LOW DOSE W/O CM     Other   Prediabetes   Relevant Orders   Hemoglobin A1c   Mixed hyperlipidemia    Lipid panel ordered.  May need addition of Zetia.      Relevant Orders   Lipid panel   Former smoker    Patient is a candidate for CT lung cancer screening.  He is amenable to this.  Ordered.      Relevant Orders   CT CHEST LUNG CA SCREEN LOW DOSE W/O CM   Other Visit Diagnoses     Screening for deficiency anemia       Relevant Orders   CBC      Follow-up:  Return in about 6 months (around 09/03/2022).  [place]

## 2022-03-05 DIAGNOSIS — I10 Essential (primary) hypertension: Secondary | ICD-10-CM | POA: Diagnosis not present

## 2022-03-05 DIAGNOSIS — E782 Mixed hyperlipidemia: Secondary | ICD-10-CM | POA: Diagnosis not present

## 2022-03-05 DIAGNOSIS — R7303 Prediabetes: Secondary | ICD-10-CM | POA: Diagnosis not present

## 2022-03-05 DIAGNOSIS — Z130 Encounter for screening for diseases of the blood and blood-forming organs and certain disorders involving the immune mechanism: Secondary | ICD-10-CM | POA: Diagnosis not present

## 2022-03-06 ENCOUNTER — Other Ambulatory Visit: Payer: Self-pay | Admitting: Family Medicine

## 2022-03-06 LAB — LIPID PANEL
Chol/HDL Ratio: 2.5 ratio (ref 0.0–5.0)
Cholesterol, Total: 210 mg/dL — ABNORMAL HIGH (ref 100–199)
HDL: 84 mg/dL (ref >39)
LDL Chol Calc (NIH): 113 mg/dL — ABNORMAL HIGH (ref 0–99)
Triglycerides: 76 mg/dL (ref 0–149)
VLDL Cholesterol Cal: 13 mg/dL (ref 5–40)

## 2022-03-06 LAB — HEMOGLOBIN A1C
Est. average glucose Bld gHb Est-mCnc: 126 mg/dL
Hgb A1c MFr Bld: 6 % — ABNORMAL HIGH (ref 4.8–5.6)

## 2022-03-06 LAB — CMP14+EGFR
ALT: 27 IU/L (ref 0–44)
AST: 30 IU/L (ref 0–40)
Albumin/Globulin Ratio: 1.7 (ref 1.2–2.2)
Albumin: 4.6 g/dL (ref 3.8–4.8)
Alkaline Phosphatase: 70 IU/L (ref 44–121)
BUN/Creatinine Ratio: 15 (ref 10–24)
BUN: 20 mg/dL (ref 8–27)
Bilirubin Total: 0.5 mg/dL (ref 0.0–1.2)
CO2: 25 mmol/L (ref 20–29)
Calcium: 9.6 mg/dL (ref 8.6–10.2)
Chloride: 101 mmol/L (ref 96–106)
Creatinine, Ser: 1.36 mg/dL — ABNORMAL HIGH (ref 0.76–1.27)
Globulin, Total: 2.7 g/dL (ref 1.5–4.5)
Glucose: 113 mg/dL — ABNORMAL HIGH (ref 70–99)
Potassium: 4.1 mmol/L (ref 3.5–5.2)
Sodium: 140 mmol/L (ref 134–144)
Total Protein: 7.3 g/dL (ref 6.0–8.5)
eGFR: 55 mL/min/{1.73_m2} — ABNORMAL LOW (ref >59)

## 2022-03-06 LAB — CBC
Hematocrit: 46 % (ref 37.5–51.0)
Hemoglobin: 15.7 g/dL (ref 13.0–17.7)
MCH: 33.7 pg — ABNORMAL HIGH (ref 26.6–33.0)
MCHC: 34.1 g/dL (ref 31.5–35.7)
MCV: 99 fL — ABNORMAL HIGH (ref 79–97)
Platelets: 264 10*3/uL (ref 150–450)
RBC: 4.66 x10E6/uL (ref 4.14–5.80)
RDW: 13 % (ref 11.6–15.4)
WBC: 7.1 10*3/uL (ref 3.4–10.8)

## 2022-03-06 MED ORDER — EZETIMIBE 10 MG PO TABS: 10.0000 mg | ORAL_TABLET | Freq: Every day | ORAL | 3 refills | Status: DC

## 2022-03-27 ENCOUNTER — Other Ambulatory Visit: Payer: Self-pay | Admitting: Family Medicine

## 2022-03-27 DIAGNOSIS — J441 Chronic obstructive pulmonary disease with (acute) exacerbation: Secondary | ICD-10-CM

## 2022-04-06 ENCOUNTER — Encounter: Payer: Self-pay | Admitting: Family Medicine

## 2022-04-07 ENCOUNTER — Other Ambulatory Visit: Payer: Self-pay | Admitting: Family Medicine

## 2022-04-07 ENCOUNTER — Telehealth: Payer: Self-pay | Admitting: Family Medicine

## 2022-04-07 MED ORDER — NIRMATRELVIR/RITONAVIR (PAXLOVID) TABLET (RENAL DOSING): 2.0000 | ORAL_TABLET | Freq: Two times a day (BID) | ORAL | 0 refills | Status: AC

## 2022-04-07 NOTE — Telephone Encounter (Signed)
Patient has tested positive for Covid  on 8/20. Please advise [facility] [place]

## 2022-04-07 NOTE — Telephone Encounter (Signed)
Mychart message sent to patient.

## 2022-04-07 NOTE — Telephone Encounter (Signed)
I started feeling aching Friday evening. My husband was not feeling well also.  We did a in home test on Sunday morning and both were positive for covid. I followed up with a test done at [facility] on Sunday afternoon. It also returned positive for Covid. Currently,  I have NO temperature.  Symptoms include headache,  muscle aches,  sinus and lung congestion,  and tiredness.  Do I need to come in? Can you prescribe something that will help shorten the recovery? Thanks, Alben Deeds

## 2022-04-07 NOTE — Telephone Encounter (Signed)
Pt call sent to PCP

## 2022-04-26 ENCOUNTER — Other Ambulatory Visit: Payer: Self-pay | Admitting: Family Medicine

## 2022-04-29 ENCOUNTER — Other Ambulatory Visit: Payer: Self-pay | Admitting: Family Medicine

## 2022-05-09 ENCOUNTER — Encounter: Payer: Self-pay | Admitting: Family Medicine

## 2022-05-27 ENCOUNTER — Other Ambulatory Visit: Payer: Self-pay | Admitting: *Deleted

## 2022-05-27 DIAGNOSIS — Z122 Encounter for screening for malignant neoplasm of respiratory organs: Secondary | ICD-10-CM

## 2022-05-27 DIAGNOSIS — Z87891 Personal history of nicotine dependence: Secondary | ICD-10-CM

## 2022-05-28 ENCOUNTER — Encounter: Payer: Self-pay | Admitting: Family Medicine

## 2022-05-28 ENCOUNTER — Ambulatory Visit (INDEPENDENT_AMBULATORY_CARE_PROVIDER_SITE_OTHER): Payer: Medicare Other | Admitting: Family Medicine

## 2022-05-28 VITALS — BP 111/69 | HR 96 | Temp 98.2°F | Wt 224.0 lb

## 2022-05-28 DIAGNOSIS — I499 Cardiac arrhythmia, unspecified: Secondary | ICD-10-CM | POA: Diagnosis not present

## 2022-05-28 DIAGNOSIS — J019 Acute sinusitis, unspecified: Secondary | ICD-10-CM

## 2022-05-28 MED ORDER — CEFDINIR 300 MG PO CAPS: 300.0000 mg | ORAL_CAPSULE | Freq: Two times a day (BID) | ORAL | 0 refills | Status: DC

## 2022-05-28 NOTE — Progress Notes (Signed)
   Subjective:    Patient ID: [name], male    DOB: 1951/04/14, 71 y.o.   MRN: [id number]  HPI Pt arrives with possible sinus infection. Pt states he has been "plugged and drainey" for about one week. Pt states he has been having some yellow pus mucus.   Patient with head congestion drainage coughing sinus pressure soreness in the maxillary sinuses denies wheezing or difficulty breathing denies any chest tightness pressure pain shortness of breath Review of Systems     Objective:   Physical Exam  Gen-NAD not toxic TMS-normal bilateral T- normal no redness Chest-CTA respiratory rate normal no crackles CV mildly irregular no murmur Skin-warm dry Neuro-grossly normal       Assessment & Plan:  EKG shows PVCs Should do okay with this but if he gets worse to follow-up  Sinusitis antibiotic prescribed No sign of pneumonia patient not toxic

## 2022-05-29 ENCOUNTER — Other Ambulatory Visit: Payer: Self-pay | Admitting: Family Medicine

## 2022-06-04 ENCOUNTER — Ambulatory Visit (INDEPENDENT_AMBULATORY_CARE_PROVIDER_SITE_OTHER): Payer: Medicare Other

## 2022-06-04 VITALS — Ht 67.0 in | Wt 224.0 lb

## 2022-06-04 DIAGNOSIS — Z0000 Encounter for general adult medical examination without abnormal findings: Secondary | ICD-10-CM | POA: Diagnosis not present

## 2022-06-04 NOTE — Progress Notes (Signed)
Virtual Visit via Telephone Note  I connected with  [name] on 06/04/22 at  8:45 AM EDT by telephone and verified that I am speaking with the correct person using two identifiers.  Location: Patient: home Provider: [facility] Persons participating in the virtual visit: patient/Nurse Health Advisor   I discussed the limitations, risks, security and privacy concerns of performing an evaluation and management service by telephone and the availability of in person appointments. The patient expressed understanding and agreed to proceed.  Interactive audio and video telecommunications were attempted between this nurse and patient, however failed, due to patient having technical difficulties OR patient did not have access to video capability.  We continued and completed visit with audio only.  Some vital signs may be absent or patient reported.   Dionisio Sade, LPN  Subjective:   AYVION KAVANAGH is a 71 y.o. male who presents for Medicare Annual/Subsequent preventive examination.  Review of Systems     Cardiac Risk Factors include: advanced age (>56mn, >>19women);hypertension;dyslipidemia;male gender     Objective:    There were no vitals filed for this visit. There is no height or weight on file to calculate BMI.     06/04/2022    8:49 AM 01/19/2020   10:56 AM  Advanced Directives  Does Patient Have a Medical Advance Directive? No No  Would patient like information on creating a medical advance directive? No - Patient declined No - Patient declined    Current Medications (verified) Outpatient Encounter Medications as of 06/04/2022  Medication Sig   albuterol (VENTOLIN HFA) 108 (90 Base) MCG/ACT inhaler INHALE 1 TO 2 PUFFS BY MOUTH EVERY 6 HOURS AS NEEDED FOR WHEEZING FOR SHORTNESS OF BREATH   atorvastatin (LIPITOR) 80 MG tablet Take 1 tablet (80 mg total) by mouth daily.   cefdinir (OMNICEF) 300 MG capsule Take 1 capsule (300 mg total) by mouth 2 (two) times daily.    enalapril (VASOTEC) 20 MG tablet TAKE 1 TABLET BY MOUTH TWICE DAILY   ezetimibe (ZETIA) 10 MG tablet Take 1 tablet (10 mg total) by mouth daily.   Fluticasone-Umeclidin-Vilant (TRELEGY ELLIPTA) 100-62.5-25 MCG/ACT AEPB Inhale 1 puff by mouth once daily   hydrochlorothiazide (HYDRODIURIL) 25 MG tablet Take one tablet po daily   lansoprazole (PREVACID) 15 MG capsule Take 1 capsule (15 mg total) by mouth as needed.   magnesium oxide (MAG-OX) 400 MG tablet Take 400 mg by mouth daily.   naproxen sodium (ANAPROX) 220 MG tablet Take 220 mg by mouth as needed.   OVER THE COUNTER MEDICATION Areds ( omega 3, lutein, zeaxanthin) take one a day   OVER THE COUNTER MEDICATION Zyflamend - Combination of herbal supplement - patient takes 1 daily.   Potassium 99 MG TABS Take by mouth daily.   Facility-Administered Encounter Medications as of 06/04/2022  Medication   methylPREDNISolone acetate (DEPO-MEDROL) injection 40 mg    Allergies (verified) Penicillins   History: Past Medical History:  Diagnosis Date   Essential hypertension, benign    Impaired glucose tolerance    Injury of right rotator cuff    Lumbar disc disease    Mixed hyperlipidemia    Past Surgical History:  Procedure Laterality Date   BIOPSY  01/19/2020   Procedure: BIOPSY;  Surgeon: RRogene Houston MD;  [facility];  Service: Endoscopy;;   COLONOSCOPY N/A 01/19/2020   Procedure: COLONOSCOPY;  Surgeon: RRogene Houston MD;  [facility];  Service: Endoscopy;  Laterality: N/A;  ESOPHAGOGASTRODUODENOSCOPY N/A 01/19/2020   Procedure: ESOPHAGOGASTRODUODENOSCOPY (EGD);  Surgeon: Rogene Houston, MD;  [facility];  Service: Endoscopy;  Laterality: N/A;  1200   POLYPECTOMY  01/19/2020   Procedure: POLYPECTOMY;  Surgeon: Rogene Houston, MD;  [facility];  Service: Endoscopy;;   Family History  Problem Relation Age of Onset   Hypertension Sister    Diabetes type II Father    Social History    Socioeconomic History   Marital status: Married    Spouse name: Not on file   Number of children: Not on file   Years of education: Not on file   Highest education level: Not on file  Occupational History   Not on file  Tobacco Use   Smoking status: Former    Types: Cigarettes   Smokeless tobacco: Never  Vaping Use   Vaping Use: Never used  Substance and Sexual Activity   Alcohol use: Yes    Comment: 1-2 glasses per night   Drug use: No   Sexual activity: Not on file  Other Topics Concern   Not on file  Social History Narrative   Not on file   Social Determinants of Health   Financial Resource Strain: Low Risk  (06/04/2022)   Overall Financial Resource Strain (CARDIA)    Difficulty of Paying Living Expenses: Not hard at all  Food Insecurity: No Food Insecurity (06/04/2022)   Hunger Vital Sign    Worried About Running Out of Food in the Last Year: Never true    [place] in the Last Year: Never true  Transportation Needs: No Transportation Needs (06/04/2022)   PRAPARE - Hydrologist (Medical): No    Lack of Transportation (Non-Medical): No  Physical Activity: Insufficiently Active (06/04/2022)   Exercise Vital Sign    Days of Exercise per Week: 3 days    Minutes of Exercise per Session: 30 min  Stress: No Stress Concern Present (06/04/2022)   [place]    Feeling of Stress : Not at all  Social Connections: Moderately Isolated (06/04/2022)   Social Connection and Isolation Panel [NHANES]    Frequency of Communication with Friends and Family: More than three times a week    Frequency of Social Gatherings with Friends and Family: Never    Attends Religious Services: Never    Printmaker: No    Attends Music therapist: Never    Marital Status: Married    Tobacco Counseling Counseling given: Not Answered   Clinical  Intake:  Pre-visit preparation completed: Yes  Pain : No/denies pain     Nutritional Risks: None Diabetes: No  How often do you need to have someone help you when you read instructions, pamphlets, or other written materials from your doctor or pharmacy?: 1 - Never  Diabetic?no  Interpreter Needed?: No  Information entered by :: Kirke Shaggy,  LPN   Activities of Daily Living    06/04/2022    8:50 AM  In your present state of health, do you have any difficulty performing the following activities:  Hearing? 0  Vision? 0  Difficulty concentrating or making decisions? 0  Walking or climbing stairs? 0  Dressing or bathing? 0  Doing errands, shopping? 0  Preparing Food and eating ? N  Using the Toilet? N  In the past six months, have you accidently leaked urine? N  Do you have  problems with loss of bowel control? N  Managing your Medications? N  Managing your Finances? N  Housekeeping or managing your Housekeeping? N    Patient Care Team: Coral Spikes, DO as PCP - General (Family Medicine) Satira Sark, MD (Cardiology)  Indicate any recent Medical Services you may have received from other than [facility] providers in the past year (date may be approximate).     Assessment:   This is a routine wellness examination for Leontae.  Hearing/Vision screen Hearing Screening - Comments:: No aids Vision Screening - Comments:: Wears glasses-  Eye  Dietary issues and exercise activities discussed: Current Exercise Habits: Home exercise routine, Type of exercise: walking, Time (Minutes): 30, Frequency (Times/Week): 3, Weekly Exercise (Minutes/Week): 90, Intensity: Mild   Goals Addressed             This Visit's Progress    DIET - EAT MORE FRUITS AND VEGETABLES         Depression Screen    06/04/2022    8:48 AM 08/27/2021    1:39 PM 02/04/2021    8:44 AM 07/30/2020    9:26 AM 02/03/2020    1:32 PM 07/27/2018    2:48 PM 07/16/2017    8:56 AM  PHQ 2/9  Scores  PHQ - 2 Score 0 0 0 0 0 0 0  PHQ- 9 Score 0          Fall Risk    06/04/2022    8:50 AM 08/27/2021    1:39 PM 02/04/2021    8:44 AM 07/30/2020    9:26 AM 02/03/2020    1:31 PM  Fall Risk   Falls in the past year? 0 0 0 0 0  Number falls in past yr: 0 0  0 0  Injury with Fall? 0 0  0 0  Risk for fall due to : No Fall Risks No Fall Risks No Fall Risks    Follow up Falls prevention discussed;Falls evaluation completed Falls evaluation completed Falls evaluation completed Falls evaluation completed Falls evaluation completed    FALL RISK PREVENTION PERTAINING TO THE HOME:  Any stairs in or around the home? No  If so, are there any without handrails? No  Home free of loose throw rugs in walkways, pet beds, electrical cords, etc? Yes  Adequate lighting in your home to reduce risk of falls? Yes   ASSISTIVE DEVICES UTILIZED TO PREVENT FALLS:  Life alert? No  Use of a cane, walker or w/c? No  Grab bars in the bathroom? No  Shower chair or bench in shower? No  Elevated toilet seat or a handicapped toilet? Yes    Cognitive Function:        06/04/2022    8:51 AM  6CIT Screen  What Year? 0 points  What month? 0 points  What time? 0 points  Count back from 20 0 points  Months in reverse 0 points  Repeat phrase 0 points  Total Score 0 points    Immunizations Immunization History  Administered Date(s) Administered   Fluad Quad(high Dose 65+) 05/18/2020   Influenza,inj,Quad PF,6+ Mos 07/27/2018, 08/27/2021   Influenza-Unspecified 06/18/2013, 06/08/2015, 06/02/2016, 06/15/2019   Moderna Sars-Covid-2 Vaccination 09/08/2019, 10/14/2019, 07/28/2020, 06/02/2021   Pneumococcal Conjugate-13 07/29/2019   Pneumococcal Polysaccharide-23 07/15/2016    TDAP status: Due, Education has been provided regarding the importance of this vaccine. Advised may receive this vaccine at local pharmacy or Health Dept. Aware to provide a copy of the vaccination record if obtained from  local  pharmacy or Health Dept. Verbalized acceptance and understanding.  Flu Vaccine status: Up to date  Pneumococcal vaccine status: Up to date  Covid-19 vaccine status: Completed vaccines  Qualifies for Shingles Vaccine? Yes   Zostavax completed No   Shingrix Completed?: No.    Education has been provided regarding the importance of this vaccine. Patient has been advised to call insurance company to determine out of pocket expense if they have not yet received this vaccine. Advised may also receive vaccine at local pharmacy or Health Dept. Verbalized acceptance and understanding.  Screening Tests Health Maintenance  Topic Date Due   TETANUS/TDAP  Never done   Zoster Vaccines- Shingrix (1 of 2) Never done   INFLUENZA VACCINE  03/18/2022   COLONOSCOPY (Pts 45-45yr Insurance coverage will need to be confirmed)  01/19/2027   Pneumonia Vaccine 72 Years old  Completed   HPV VACCINES  Aged Out   COVID-19 Vaccine  Discontinued   Hepatitis C Screening  Discontinued    Health Maintenance  Health Maintenance Due  Topic Date Due   TETANUS/TDAP  Never done   Zoster Vaccines- Shingrix (1 of 2) Never done   INFLUENZA VACCINE  03/18/2022    Colorectal cancer screening: Type of screening: Colonoscopy. Completed 01/19/20. Repeat every 7 years  Lung Cancer Screening: (Low Dose CT Chest recommended if Age 72-80years, 30 pack-year currently smoking OR have quit w/in 15years.) does not qualify.   Additional Screening:  Hepatitis C Screening: does qualify; Completed no  Vision Screening: Recommended annual ophthalmology exams for early detection of glaucoma and other disorders of the eye. Is the patient up to date with their annual eye exam?  Yes  Who is the provider or what is the name of the office in which the patient attends annual [place] exams[email or web address] not [place] with a provider, would they like to be referred to a provider to establish care? No .   Dental Screening:  Recommended annual dental exams for proper oral hygiene  Community Resource Referral / Chronic Care Management: CRR required this visit?  No   CCM required this visit?  No      Plan:     I have personally reviewed and noted the following in the patient's chart:   Medical and social history Use of alcohol, tobacco or illicit drugs  Current medications and supplements including opioid prescriptions. Patient is not currently taking opioid prescriptions. Functional ability and status Nutritional status Physical activity Advanced directives List of other physicians Hospitalizations, surgeries, and ER visits in previous 12 months Vitals Screenings to include cognitive, depression, and falls Referrals and appointments  In addition, I have reviewed and discussed with patient certain preventive protocols, quality metrics, and best practice recommendations. A written personalized care plan for preventive services as well as general preventive health recommendations were provided to patient.     LDionisio Lerry LPN   [id number]  Nurse Notes: none

## 2022-06-04 NOTE — Patient Instructions (Signed)
[name] , Thank you for taking time to come for your Medicare Wellness Visit. I appreciate your ongoing commitment to your health goals. Please review the following plan we discussed and let me know if I can assist you in the future.   Screening recommendations/referrals: Colonoscopy: 01/19/20, every 7 years Recommended yearly ophthalmology/optometry visit for glaucoma screening and checkup Recommended yearly dental visit for hygiene and checkup  Vaccinations: Influenza vaccine: 08/27/21 Pneumococcal vaccine: 07/29/19 Tdap vaccine: n/d Shingles vaccine: n/d   Covid-19: 09/08/19, 10/14/19, 07/28/20, 06/02/21  Advanced directives: no  Conditions/risks identified: none  Next appointment: Follow up in one year for your annual wellness visit. 06/16/23 @ 10:45 am by phone  Preventive Care 65 Years and Older, Male Preventive care refers to lifestyle choices and visits with your health care provider that can promote health and wellness. What does preventive care include? A yearly physical exam. This is also called an annual well check. Dental exams once or twice a year. Routine eye exams. Ask your health care provider how often you should have your eyes checked. Personal lifestyle choices, including: Daily care of your teeth and gums. Regular physical activity. Eating a healthy diet. Avoiding tobacco and drug use. Limiting alcohol use. Practicing safe sex. Taking low doses of aspirin every day. Taking vitamin and mineral supplements as recommended by your health care provider. What happens during an annual well check? The services and screenings done by your health care provider during your annual well check will depend on your age, overall health, lifestyle risk factors, and family history of disease. Counseling  Your health care provider may ask you questions about your: Alcohol use. Tobacco use. Drug use. Emotional well-being. Home and relationship well-being. Sexual  activity. Eating habits. History of falls. Memory and ability to understand (cognition). Work and work Statistician. Screening  You may have the following tests or measurements: Height, weight, and BMI. Blood pressure. Lipid and cholesterol levels. These may be checked every 5 years, or more frequently if you are over 41 years old. Skin check. Lung cancer screening. You may have this screening every year starting at age 40 if you have a 30-pack-year history of smoking and currently smoke or have quit within the past 15 years. Fecal occult blood test (FOBT) of the stool. You may have this test every year starting at age 10. Flexible sigmoidoscopy or colonoscopy. You may have a sigmoidoscopy every 5 years or a colonoscopy every 10 years starting at age 11. Prostate cancer screening. Recommendations will vary depending on your family history and other risks. Hepatitis C blood test. Hepatitis B blood test. Sexually transmitted disease (STD) testing. Diabetes screening. This is done by checking your blood sugar (glucose) after you have not eaten for a while (fasting). You may have this done every 1-3 years. Abdominal aortic aneurysm (AAA) screening. You may need this if you are a current or former smoker. Osteoporosis. You may be screened starting at age 50 if you are at high risk. Talk with your health care provider about your test results, treatment options, and if necessary, the need for more tests. Vaccines  Your health care provider may recommend certain vaccines, such as: Influenza vaccine. This is recommended every year. Tetanus, diphtheria, and acellular pertussis (Tdap, Td) vaccine. You may need a Td booster every 10 years. Zoster vaccine. You may need this after age 37. Pneumococcal 13-valent conjugate (PCV13) vaccine. One dose is recommended after age 63. Pneumococcal polysaccharide (PPSV23) vaccine. One dose is recommended after age 45. Talk  to your health care provider about which  screenings and vaccines you need and how often you need them. This information is not intended to replace advice given to you by your health care provider. Make sure you discuss any questions you have with your health care provider. Document Released: 08/31/2015 Document Revised: 04/23/2016 Document Reviewed: 06/05/2015 Elsevier Interactive Patient Education  2017 [place] Prevention in the Home Falls can cause injuries. They can happen to people of all ages. There are many things you can do to make your home safe and to help prevent falls. What can I do on the outside of my home? Regularly fix the edges of walkways and driveways and fix any cracks. Remove anything that might make you trip as you walk through a door, such as a raised step or threshold. Trim any bushes or trees on the path to your home. Use bright outdoor lighting. Clear any walking paths of anything that might make someone trip, such as rocks or tools. Regularly check to see if handrails are loose or broken. Make sure that both sides of any steps have handrails. Any raised decks and porches should have guardrails on the edges. Have any leaves, snow, or ice cleared regularly. Use sand or salt on walking paths during winter. Clean up any spills in your garage right away. This includes oil or grease spills. What can I do in the bathroom? Use night lights. Install grab bars by the toilet and in the tub and shower. Do not use towel bars as grab bars. Use non-skid mats or decals in the tub or shower. If you need to sit down in the shower, use a plastic, non-slip stool. Keep the floor dry. Clean up any water that spills on the floor as soon as it happens. Remove soap buildup in the tub or shower regularly. Attach bath mats securely with double-sided non-slip rug tape. Do not have throw rugs and other things on the floor that can make you trip. What can I do in the bedroom? Use night lights. Make sure that you have a  light by your bed that is easy to reach. Do not use any sheets or blankets that are too big for your bed. They should not hang down onto the floor. Have a firm chair that has side arms. You can use this for support while you get dressed. Do not have throw rugs and other things on the floor that can make you trip. What can I do in the kitchen? Clean up any spills right away. Avoid walking on wet floors. Keep items that you use a lot in easy-to-reach places. If you need to reach something above you, use a strong step stool that has a grab bar. Keep electrical cords out of the way. Do not use floor polish or wax that makes floors slippery. If you must use wax, use non-skid floor wax. Do not have throw rugs and other things on the floor that can make you trip. What can I do with my stairs? Do not leave any items on the stairs. Make sure that there are handrails on both sides of the stairs and use them. Fix handrails that are broken or loose. Make sure that handrails are as long as the stairways. Check any carpeting to make sure that it is firmly attached to the stairs. Fix any carpet that is loose or worn. Avoid having throw rugs at the top or bottom of the stairs. If you do have throw rugs,  attach them to the floor with carpet tape. Make sure that you have a light switch at the top of the stairs and the bottom of the stairs. If you do not have them, ask someone to add them for you. What else can I do to help prevent falls? Wear shoes that: Do not have high heels. Have rubber bottoms. Are comfortable and fit you well. Are closed at the toe. Do not wear sandals. If you use a stepladder: Make sure that it is fully opened. Do not climb a closed stepladder. Make sure that both sides of the stepladder are locked into place. Ask someone to hold it for you, if possible. Clearly mark and make sure that you can see: Any grab bars or handrails. First and last steps. Where the edge of each step  is. Use tools that help you move around (mobility aids) if they are needed. These include: Canes. Walkers. Scooters. Crutches. Turn on the lights when you go into a dark area. Replace any light bulbs as soon as they burn out. Set up your furniture so you have a clear path. Avoid moving your furniture around. If any of your floors are uneven, fix them. If there are any pets around you, be aware of where they are. Review your medicines with your doctor. Some medicines can make you feel dizzy. This can increase your chance of falling. Ask your doctor what other things that you can do to help prevent falls. This information is not intended to replace advice given to you by your health care provider. Make sure you discuss any questions you have with your health care provider. Document Released: 05/31/2009 Document Revised: 01/10/2016 Document Reviewed: 09/08/2014 Elsevier Interactive Patient Education  2017 [employer].

## 2022-06-25 ENCOUNTER — Ambulatory Visit (INDEPENDENT_AMBULATORY_CARE_PROVIDER_SITE_OTHER): Payer: Medicare Other | Admitting: Acute Care

## 2022-06-25 ENCOUNTER — Encounter: Payer: Self-pay | Admitting: Acute Care

## 2022-06-25 DIAGNOSIS — Z87891 Personal history of nicotine dependence: Secondary | ICD-10-CM

## 2022-06-25 NOTE — Progress Notes (Signed)
Virtual Visit via Telephone Note  I connected with [name] on 06/25/22 at  8:30 AM EST by telephone and verified that I am speaking with the correct person using two identifiers.  Location: Patient:  At home Provider:  [address], [place], [place], [address]    I discussed the limitations, risks, security and privacy concerns of performing an evaluation and management service by telephone and the availability of in person appointments. I also discussed with the patient that there may be a patient responsible charge related to this service. The patient expressed understanding and agreed to proceed.    Shared Decision Making Visit Lung Cancer Screening Program [phone])   Eligibility: Age 71 y.o. Pack Years Smoking History Calculation 63.5 pack year smoking history (# packs/per year x # years smoked) Recent History of coughing up blood  no Unexplained weight loss? no ( >Than 15 pounds within the last 6 months ) Prior History Lung / other cancer no (Diagnosis within the last 5 years already requiring surveillance chest CT Scans). Smoking Status Former Smoker Former Smokers: Years since quit: 5 years  Quit Date: 2018  Visit Components: Discussion included one or more decision making aids. yes Discussion included risk/benefits of screening. yes Discussion included potential follow up diagnostic testing for abnormal scans. yes Discussion included meaning and risk of over diagnosis. yes Discussion included meaning and risk of False Positives. yes Discussion included meaning of total radiation exposure. yes  Counseling Included: Importance of adherence to annual lung cancer LDCT screening. yes Impact of comorbidities on ability to participate in the program. yes Ability and willingness to under diagnostic treatment. yes  Smoking Cessation Counseling: Current Smokers:  Discussed importance of smoking cessation. yes Information about tobacco cessation classes and  interventions provided to patient. yes Patient provided with "ticket" for LDCT Scan. yes Symptomatic Patient. no  Counseling NA Diagnosis Code: Tobacco Use Z72.0 Asymptomatic Patient yes  Counseling (Intermediate counseling: > three minutes counseling) A1655 Former Smokers:  Discussed the importance of maintaining cigarette abstinence. yes Diagnosis Code: Personal History of Nicotine Dependence. V74.827 Information about tobacco cessation classes and interventions provided to patient. Yes Patient provided with "ticket" for LDCT Scan. yes Written Order for Lung Cancer Screening with LDCT placed in Epic. Yes (CT Chest Lung Cancer Screening Low Dose W/O CM) [id number] Z12.2-Screening of respiratory organs Z87.891-Personal history of nicotine dependence  I spent 25 minutes of face to face time/virtual visit time  with  Mr. Zeiner discussing the risks and benefits of lung cancer screening. We took the time to pause the power point at intervals to allow for questions to be asked and answered to ensure understanding. We discussed that he had taken the single most powerful action possible to decrease his risk of developing lung cancer when he quit smoking. I counseled him to remain smoke free, and to contact me if he ever had the desire to smoke again so that I can provide resources and tools to help support the effort to remain smoke free. We discussed the time and location of the scan, and that either  Doroteo Glassman RN, Joella Prince, RN or I  or I will call / send a letter with the results within  24-72 hours of receiving them. He has the office contact information in the event he needs to speak with me,  he verbalized understanding of all of the above and had no further questions upon leaving the office.     I explained to the patient that  there has been a high incidence of coronary artery disease noted on these exams. I explained that this is a non-gated exam therefore degree or severity cannot be  determined. This patient is on statin therapy. I have asked the patient to follow-up with their PCP regarding any incidental finding of coronary artery disease and management with diet or medication as they feel is clinically indicated. The patient verbalized understanding of the above and had no further questions.     Magdalen Spatz, NP 06/25/2022

## 2022-06-25 NOTE — Patient Instructions (Signed)
Thank you for participating in the [facility]. It was our pleasure to meet you today. We will call you with the results of your scan within the next few days. Your scan will be assigned a Lung RADS category score by the physicians reading the scans.  This Lung RADS score determines follow up scanning.  See below for description of categories, and follow up screening recommendations. We will be in touch to schedule your follow up screening annually or based on recommendations of our providers. We will fax a copy of your scan results to your Primary Care Physician, or the physician who referred you to the program, to ensure they have the results. Please call the office if you have any questions or concerns regarding your scanning experience or results.  Our office number is [phone]. Please speak with Denise Phelps, RN. , or  Denise Buckner RN, They are  our Lung Cancer Screening RN.'s If They are unavailable when you call, Please leave a message on the voice mail. We will return your call at our earliest convenience.This voice mail is monitored several times a day.  Remember, if your scan is normal, we will scan you annually as long as you continue to meet the criteria for the program. (Age 55-77, Current smoker or smoker who has quit within the last 15 years). If you are a smoker, remember, quitting is the single most powerful action that you can take to decrease your risk of lung cancer and other pulmonary, breathing related problems. We know quitting is hard, and we are here to help.  Please let us know if there is anything we can do to help you meet your goal of quitting. If you are a former smoker, congratulations. We are proud of you! Remain smoke free! Remember you can refer friends or family members through the number above.  We will screen them to make sure they meet criteria for the program. Thank you for helping us take better care of you by  participating in Lung Screening.  You can receive free nicotine replacement therapy ( patches, gum or mints) by calling 1-800-QUIT NOW. Please call so we can get you on the path to becoming  a non-smoker. I know it is hard, but you can do this!  Lung RADS Categories:  Lung RADS 1: no nodules or definitely non-concerning nodules.  Recommendation is for a repeat annual scan in 12 months.  Lung RADS 2:  nodules that are non-concerning in appearance and behavior with a very low likelihood of becoming an active cancer. Recommendation is for a repeat annual scan in 12 months.  Lung RADS 3: nodules that are probably non-concerning , includes nodules with a low likelihood of becoming an active cancer.  Recommendation is for a 6-month repeat screening scan. Often noted after an upper respiratory illness. We will be in touch to make sure you have no questions, and to schedule your 6-month scan.  Lung RADS 4 A: nodules with concerning findings, recommendation is most often for a follow up scan in 3 months or additional testing based on our provider's assessment of the scan. We will be in touch to make sure you have no questions and to schedule the recommended 3 month follow up scan.  Lung RADS 4 B:  indicates findings that are concerning. We will be in touch with you to schedule additional diagnostic testing based on our provider's  assessment of the scan.  Other options for assistance in smoking cessation (   As covered by your insurance benefits)  Hypnosis for smoking cessation  [employer] [phone]  Acupuncture for smoking cessation  [facility] [phone]   

## 2022-07-02 ENCOUNTER — Ambulatory Visit (HOSPITAL_COMMUNITY)
Admission: RE | Admit: 2022-07-02 | Discharge: 2022-07-02 | Disposition: A | Discharge status: Home / Self Care | Payer: Medicare Other | Source: Ambulatory Visit | Attending: Acute Care | Admitting: Acute Care

## 2022-07-02 DIAGNOSIS — Z122 Encounter for screening for malignant neoplasm of respiratory organs: Secondary | ICD-10-CM | POA: Diagnosis not present

## 2022-07-02 DIAGNOSIS — Z87891 Personal history of nicotine dependence: Secondary | ICD-10-CM | POA: Diagnosis not present

## 2022-07-03 ENCOUNTER — Telehealth: Payer: Self-pay | Admitting: Acute Care

## 2022-07-03 ENCOUNTER — Other Ambulatory Visit: Payer: Self-pay

## 2022-07-03 DIAGNOSIS — Z122 Encounter for screening for malignant neoplasm of respiratory organs: Secondary | ICD-10-CM

## 2022-07-03 DIAGNOSIS — Z87891 Personal history of nicotine dependence: Secondary | ICD-10-CM

## 2022-07-03 NOTE — Telephone Encounter (Signed)
Called office twice but could not get anything but a message line.  Left message regarding patient abnormal findings and to please have Dr. Lacinda Axon review the telephone message.

## 2022-07-03 NOTE — Telephone Encounter (Signed)
I have called the patient with the results of his low-dose screening CT.  From a lung cancer perspective his scan was read as a lung RADS 2 annual screening.  There was notation of moderate emphysema which she and I discussed. There was an incidental finding as noted below as concern for lesions on right kidney, aortic atherosclerosis to the left main and three-vessel coronary arteries, and aortic calcifications. Recommendation is for nonemergent abdominal MRI with and without gadolinium to better evaluate, and follow-up with cardiology regarding calcifications of the aortic valve radiology is suggesting an echo to evaluate potential valvular dysfunction.  I attempted to call these results to Dr. Jonathon Jordan office however they are closed for lunch.  I will have my nurse Langley Gauss call the office after 1:30 to ensure that they are aware of these findings and placed the order for MRI and echo if they feel they are clinically appropriate.  Dr. Lacinda Axon I am adding you to this telephone note to ensure you are aware of these findings and can follow-up as you feel is clinically indicated..  Patient is on statin therapy but states he has not seen cardiology for about 10 years.    Thanks so much  Langley Gauss from a lung cancer screening perspective patient needs an annual screening in November 2024.  Thanks so much     right kidney is partially imaged in very complex in appearance with innumerable lesions, some of which are partially calcified. Further evaluation with nonemergent abdominal MRI with and without IV gadolinium is strongly recommended in the near future to better evaluate this finding and exclude underlying neoplasm. 3. Aortic atherosclerosis, in addition to left main and three-vessel coronary artery disease. Please note that although the presence of coronary artery calcium documents the presence of coronary artery disease, the severity of this disease and any potential stenosis cannot be assessed on  this non-gated CT examination. Assessment for potential risk factor modification, dietary therapy or pharmacologic therapy may be warranted, if clinically indicated. 4. Mild diffuse bronchial wall thickening with moderate centrilobular and paraseptal emphysema; imaging findings suggestive of underlying COPD. 5. There are calcifications of the aortic valve. Echocardiographic correlation for evaluation of potential valvular dysfunction may be warranted if clinically indicated.

## 2022-07-03 NOTE — Telephone Encounter (Signed)
Called report from [facility] Radiology on CT Scan for Lung Cancer Screening:  Please advise Jerome Sanchez   IMPRESSION: 1. Lung-RADS 2S, benign appearance or behavior. Continue annual screening with low-dose chest CT without contrast in 12 months. 2. The "S" modifier above refers to potentially clinically significant non lung cancer related findings. Specifically, the right kidney is partially imaged in very complex in appearance with innumerable lesions, some of which are partially calcified. Further evaluation with nonemergent abdominal MRI with and without IV gadolinium is strongly recommended in the near future to better evaluate this finding and exclude underlying neoplasm. 3. Aortic atherosclerosis, in addition to left main and three-vessel coronary artery disease. Please note that although the presence of coronary artery calcium documents the presence of coronary artery disease, the severity of this disease and any potential stenosis cannot be assessed on this non-gated CT examination. Assessment for potential risk factor modification, dietary therapy or pharmacologic therapy may be warranted, if clinically indicated. 4. Mild diffuse bronchial wall thickening with moderate centrilobular and paraseptal emphysema; imaging findings suggestive of underlying COPD. 5. There are calcifications of the aortic valve. Echocardiographic correlation for evaluation of potential valvular dysfunction may be warranted if clinically indicated.

## 2022-07-04 NOTE — Telephone Encounter (Signed)
Thersa Salt G, DO     Please have patient come in to discuss.

## 2022-07-04 NOTE — Telephone Encounter (Signed)
Left message to return call 

## 2022-07-07 NOTE — Telephone Encounter (Signed)
Patient scheduled office visit 07/15/22 with Dr Lacinda Axon to discuss

## 2022-07-15 ENCOUNTER — Ambulatory Visit (INDEPENDENT_AMBULATORY_CARE_PROVIDER_SITE_OTHER): Payer: Medicare Other | Admitting: Family Medicine

## 2022-07-15 VITALS — BP 138/66 | Ht 70.0 in | Wt 233.0 lb

## 2022-07-15 DIAGNOSIS — I359 Nonrheumatic aortic valve disorder, unspecified: Secondary | ICD-10-CM | POA: Diagnosis not present

## 2022-07-15 DIAGNOSIS — N1831 Chronic kidney disease, stage 3a: Secondary | ICD-10-CM | POA: Diagnosis not present

## 2022-07-15 DIAGNOSIS — E782 Mixed hyperlipidemia: Secondary | ICD-10-CM | POA: Diagnosis not present

## 2022-07-15 DIAGNOSIS — I2510 Atherosclerotic heart disease of native coronary artery without angina pectoris: Secondary | ICD-10-CM

## 2022-07-15 DIAGNOSIS — N2889 Other specified disorders of kidney and ureter: Secondary | ICD-10-CM

## 2022-07-15 NOTE — Assessment & Plan Note (Signed)
Reevaluating today.  If LDL not at goal of less than 70 will likely need to see cardiology so that he can be considered for PCSK9.  I have trouble getting these approved.

## 2022-07-15 NOTE — Assessment & Plan Note (Signed)
Discussed referral to cardiology.  Patient would like to revisit this at his follow-up appointment in January.  Currently no chest pain.  He has baseline shortness of breath.  His blood pressure is fairly well-controlled.  Needs aggressive management of his lipids which is already being done.

## 2022-07-15 NOTE — Assessment & Plan Note (Signed)
Needs further evaluation especially given his history of smoking.  MRI has been ordered.

## 2022-07-15 NOTE — Progress Notes (Signed)
Subjective:  Patient ID: Jerome Sanchez, male    DOB: 06-Mar-1951  Age: 71 y.o. MRN: [id number]  CC: Chief Complaint  Patient presents with   Discuss results of low dose CT    No problems or concerns    HPI:  71 year old male with hypertension, COPD, Barrett's esophagus, history of tobacco abuse, hyperlipidemia, prediabetes, CKD presents to discuss findings on recent CT chest for lung cancer screening.  There were multiple abnormal findings on his recent CT.  COPD was noted.  There were innumerable lesions of the right kidney stone which appeared calcified and complex in appearance.  It has been recommended that this be further evaluated with MRI.  Aortic atherosclerosis as well as left main and three-vessel coronary artery disease noted as well.  Lastly, calcifications of the aortic valve were noted.  I have discussed the above findings with the patient.  He is not currently experiencing any chest pain.  He has baseline shortness of breath.  He is able to exert himself but has to stop to rest.  Tries to walk regularly.  He is no longer smoking.  No other complaints or concerns at this time.  Patient Active Problem List   Diagnosis Date Noted   Stage 3a chronic kidney disease ([place]) 07/15/2022   Aortic valve disorder 07/15/2022   Other specified disorders of kidney and ureter 07/15/2022   CAD (coronary artery disease) 07/15/2022   Prediabetes 03/04/2022   Former smoker 03/04/2022   Annual physical exam 08/27/2021   Tubular adenoma of colon 01/26/2020   Barrett's esophagus 01/26/2020   COPD (chronic obstructive pulmonary disease) ([place]) 07/27/2018   Mixed hyperlipidemia 08/05/2012   Essential hypertension, benign 10/26/2008    Social Hx   Social History   Socioeconomic History   Marital status: Married    Spouse name: Not on file   Number of children: Not on file   Years of education: Not on file   Highest education level: Not on file  Occupational History   Not on file   Tobacco Use   Smoking status: Former    Packs/day: 1.25    Years: 51.00    Total pack years: 63.75    Types: Cigarettes    Quit date: 2018    Years since quitting: 5.9   Smokeless tobacco: Never  Vaping Use   Vaping Use: Never used  Substance and Sexual Activity   Alcohol use: Yes    Comment: 1-2 glasses per night   Drug use: No   Sexual activity: Not on file  Other Topics Concern   Not on file  Social History Narrative   Not on file   Social Determinants of Health   Financial Resource Strain: Low Risk  (06/04/2022)   Overall Financial Resource Strain (CARDIA)    Difficulty of Paying Living Expenses: Not hard at all  Food Insecurity: No Food Insecurity (06/04/2022)   Hunger Vital Sign    Worried About Running Out of Food in the Last Year: Never true    Ran Out of Food in the Last Year: Never true  Transportation Needs: No Transportation Needs (06/04/2022)   PRAPARE - Hydrologist (Medical): No    Lack of Transportation (Non-Medical): No  Physical Activity: Insufficiently Active (06/04/2022)   Exercise Vital Sign    Days of Exercise per Week: 3 days    Minutes of Exercise per Session: 30 min  Stress: No Stress Concern Present (06/04/2022)   [employer] - Occupational Stress Questionnaire    Feeling of Stress : Not at all  Social Connections: Moderately Isolated (06/04/2022)   Social Connection and Isolation Panel [NHANES]    Frequency of Communication with Friends and Family: More than three times a week    Frequency of Social Gatherings with Friends and Family: Never    Attends Religious Services: Never    Marine scientist or Organizations: No    Attends Music therapist: Never    Marital Status: Married    Review of Systems Per HPI  Objective:  BP 138/66   Ht '5\' 10"'$  (1.778 m)   Wt 233 lb (105.7 kg)   BMI 33.43 kg/m      07/15/2022    8:14 AM 06/04/2022    8:56 AM 05/28/2022     4:07 PM  BP/Weight  Systolic BP 361  443  Diastolic BP 66  69  Wt. (Lbs) 233 224   BMI 33.43 kg/m2 35.08 kg/m2     Physical Exam Vitals and nursing note reviewed.  Constitutional:      Appearance: Normal appearance. He is obese.  HENT:     Head: Normocephalic and atraumatic.  Eyes:     General:        Right eye: No discharge.        Left eye: No discharge.     Conjunctiva/sclera: Conjunctivae normal.  Cardiovascular:     Rate and Rhythm: Normal rate and regular rhythm.     Heart sounds: Murmur heard.  Neurological:     Mental Status: He is alert.  Psychiatric:        Mood and Affect: Mood normal.        Behavior: Behavior normal.     Lab Results  Component Value Date   WBC 7.1 03/05/2022   HGB 15.7 03/05/2022   HCT 46.0 03/05/2022   PLT 264 03/05/2022   GLUCOSE 113 (H) 03/05/2022   CHOL 210 (H) 03/05/2022   TRIG 76 03/05/2022   HDL 84 03/05/2022   LDLCALC 113 (H) 03/05/2022   ALT 27 03/05/2022   AST 30 03/05/2022   NA 140 03/05/2022   K 4.1 03/05/2022   CL 101 03/05/2022   CREATININE 1.36 (H) 03/05/2022   BUN 20 03/05/2022   CO2 25 03/05/2022   PSA 0.66 06/27/2014   HGBA1C 6.0 (H) 03/05/2022     Assessment & Plan:   Problem List Items Addressed This Visit       Cardiovascular and Mediastinum   Aortic valve disorder    Suspect aortic stenosis.  Has a known murmur.  Echocardiogram for further evaluation.      Relevant Orders   ECHOCARDIOGRAM COMPLETE   CAD (coronary artery disease)    Discussed referral to cardiology.  Patient would like to revisit this at his follow-up appointment in January.  Currently no chest pain.  He has baseline shortness of breath.  His blood pressure is fairly well-controlled.  Needs aggressive management of his lipids which is already being done.        Genitourinary   Other specified disorders of kidney and ureter - Primary    Needs further evaluation especially given his history of smoking.  MRI has been  ordered.      Relevant Orders   MR Abdomen W Wo Contrast   Stage 3a chronic kidney disease ([place])   Relevant Orders   Microalbumin / creatinine urine ratio   Basic Metabolic Panel  Other   Mixed hyperlipidemia    Reevaluating today.  If LDL not at goal of less than 70 will likely need to see cardiology so that he can be considered for PCSK9.  I have trouble getting these approved.      Relevant Orders   Lipid panel    Follow-up:  Has scheduled follow up in January.   [place]

## 2022-07-15 NOTE — Patient Instructions (Addendum)
Labs ordered.  MRI and Echo ordered. We will arrange.  We will call with the results.

## 2022-07-15 NOTE — Assessment & Plan Note (Signed)
Suspect aortic stenosis.  Has a known murmur.  Echocardiogram for further evaluation.

## 2022-07-17 LAB — BASIC METABOLIC PANEL
BUN/Creatinine Ratio: 18 (ref 10–24)
BUN: 24 mg/dL (ref 8–27)
CO2: 23 mmol/L (ref 20–29)
Calcium: 9.7 mg/dL (ref 8.6–10.2)
Chloride: 101 mmol/L (ref 96–106)
Creatinine, Ser: 1.31 mg/dL — ABNORMAL HIGH (ref 0.76–1.27)
Glucose: 120 mg/dL — ABNORMAL HIGH (ref 70–99)
Potassium: 4.6 mmol/L (ref 3.5–5.2)
Sodium: 143 mmol/L (ref 134–144)
eGFR: 58 mL/min/{1.73_m2} — ABNORMAL LOW (ref >59)

## 2022-07-17 LAB — LIPID PANEL
Chol/HDL Ratio: 1.9 ratio (ref 0.0–5.0)
Cholesterol, Total: 183 mg/dL (ref 100–199)
HDL: 94 mg/dL (ref >39)
LDL Chol Calc (NIH): 74 mg/dL (ref 0–99)
Triglycerides: 81 mg/dL (ref 0–149)
VLDL Cholesterol Cal: 15 mg/dL (ref 5–40)

## 2022-07-17 LAB — MICROALBUMIN / CREATININE URINE RATIO
Creatinine, Urine: 50.6 mg/dL
Microalb/Creat Ratio: 87 mg/g creat — ABNORMAL HIGH (ref 0–29)
Microalbumin, Urine: 44.2 ug/mL

## 2022-07-23 NOTE — Addendum Note (Signed)
Addended by: Dairl Ponder on: 07/23/2022 11:41 AM   Modules accepted: Orders

## 2022-07-25 ENCOUNTER — Encounter: Payer: Self-pay | Admitting: Family Medicine

## 2022-07-25 ENCOUNTER — Other Ambulatory Visit: Payer: Self-pay | Admitting: Family Medicine

## 2022-07-27 ENCOUNTER — Encounter: Payer: Self-pay | Admitting: Family Medicine

## 2022-08-04 ENCOUNTER — Other Ambulatory Visit: Payer: Self-pay | Admitting: *Deleted

## 2022-08-04 ENCOUNTER — Ambulatory Visit (HOSPITAL_COMMUNITY)
Admission: RE | Admit: 2022-08-04 | Discharge: 2022-08-04 | Disposition: A | Discharge status: Home / Self Care | Payer: Medicare Other | Source: Ambulatory Visit | Attending: Family Medicine | Admitting: Family Medicine

## 2022-08-04 ENCOUNTER — Encounter: Payer: Self-pay | Admitting: Family Medicine

## 2022-08-04 DIAGNOSIS — N2889 Other specified disorders of kidney and ureter: Secondary | ICD-10-CM | POA: Insufficient documentation

## 2022-08-04 DIAGNOSIS — I359 Nonrheumatic aortic valve disorder, unspecified: Secondary | ICD-10-CM

## 2022-08-04 MED ORDER — GADOBUTROL 1 MMOL/ML IV SOLN
10.0000 mL | Freq: Once | INTRAVENOUS | Status: AC | PRN
  Administered 2022-08-04: 10 mL via INTRAVENOUS

## 2022-08-21 ENCOUNTER — Telehealth: Payer: Self-pay

## 2022-08-21 DIAGNOSIS — E782 Mixed hyperlipidemia: Secondary | ICD-10-CM

## 2022-08-21 DIAGNOSIS — I10 Essential (primary) hypertension: Secondary | ICD-10-CM

## 2022-08-21 DIAGNOSIS — K219 Gastro-esophageal reflux disease without esophagitis: Secondary | ICD-10-CM

## 2022-08-21 NOTE — Telephone Encounter (Signed)
Encourage patient to contact the pharmacy for refills or they can request refills through [facility]  (Please schedule appointment if patient has not been seen in over a year)    WHAT PHARMACY WOULD THEY LIKE THIS SENT TO: [facility] [place]   MEDICATION NAME & DOSE:atorvastatin (LIPITOR) 80 MG tablet ,enalapril (VASOTEC) 20 MG tablet , ezetimibe (ZETIA) 10 MG tablet , Fluticasone-Umeclidin-Vilant (TRELEGY ELLIPTA) 100-62.5-25 MCG/ACT AEPB , hydrochlorothiazide (HYDRODIURIL) 25 MG tablet , lansoprazole (PREVACID) 15 MG capsule ,   NOTES/COMMENTS FROM PATIENT:Pt Insurance has changed needs quatity changed to match 100 days at a time he has [employer]       Front office please notify patient: It takes 48-72 hours to process rx refill requests Ask patient to call pharmacy to ensure rx is ready before heading there.

## 2022-08-21 NOTE — Telephone Encounter (Signed)
Coral Spikes, DO     Yes

## 2022-08-22 MED ORDER — HYDROCHLOROTHIAZIDE 25 MG PO TABS: ORAL_TABLET | ORAL | 1 refills | Status: DC

## 2022-08-22 MED ORDER — ATORVASTATIN CALCIUM 80 MG PO TABS: 80.0000 mg | ORAL_TABLET | Freq: Every day | ORAL | 1 refills | Status: DC

## 2022-08-22 MED ORDER — LANSOPRAZOLE 15 MG PO CPDR: 15.0000 mg | DELAYED_RELEASE_CAPSULE | ORAL | 1 refills | Status: DC | PRN

## 2022-08-22 MED ORDER — EZETIMIBE 10 MG PO TABS: 10.0000 mg | ORAL_TABLET | Freq: Every day | ORAL | 1 refills | Status: DC

## 2022-08-22 MED ORDER — ENALAPRIL MALEATE 20 MG PO TABS: ORAL_TABLET | ORAL | 1 refills | Status: DC

## 2022-08-22 MED ORDER — TRELEGY ELLIPTA 100-62.5-25 MCG/ACT IN AEPB: INHALATION_SPRAY | RESPIRATORY_TRACT | 1 refills | Status: DC

## 2022-08-22 NOTE — Telephone Encounter (Signed)
Prescriptions sent electronically to pharmacy. Patient notified. °

## 2022-09-03 ENCOUNTER — Ambulatory Visit: Payer: Medicare Other | Admitting: Family Medicine

## 2022-09-03 ENCOUNTER — Encounter: Payer: Self-pay | Admitting: Family Medicine

## 2022-09-09 ENCOUNTER — Ambulatory Visit (INDEPENDENT_AMBULATORY_CARE_PROVIDER_SITE_OTHER): Payer: PPO | Admitting: Family Medicine

## 2022-09-09 ENCOUNTER — Encounter: Payer: Self-pay | Admitting: Family Medicine

## 2022-09-09 DIAGNOSIS — N289 Disorder of kidney and ureter, unspecified: Secondary | ICD-10-CM | POA: Diagnosis not present

## 2022-09-09 DIAGNOSIS — E782 Mixed hyperlipidemia: Secondary | ICD-10-CM

## 2022-09-09 DIAGNOSIS — N1831 Chronic kidney disease, stage 3a: Secondary | ICD-10-CM | POA: Diagnosis not present

## 2022-09-09 DIAGNOSIS — I2510 Atherosclerotic heart disease of native coronary artery without angina pectoris: Secondary | ICD-10-CM

## 2022-09-09 DIAGNOSIS — I359 Nonrheumatic aortic valve disorder, unspecified: Secondary | ICD-10-CM

## 2022-09-09 DIAGNOSIS — I10 Essential (primary) hypertension: Secondary | ICD-10-CM | POA: Diagnosis not present

## 2022-09-09 DIAGNOSIS — J449 Chronic obstructive pulmonary disease, unspecified: Secondary | ICD-10-CM

## 2022-09-09 NOTE — Progress Notes (Signed)
Subjective:  Patient ID: Jerome Sanchez, male    DOB: [birth year]  Age: 72 y.o. MRN: [id number]  CC: Chief Complaint  Patient presents with   Hypertension    Pt arrives for follow up. Pt would like to discuss things from last visit-nephrology 09/30/22; ultrasound of heart due to some anomalies.     HPI:  72 year old male with aortic valve disorder, coronary disease, hypertension, COPD, Barrett's esophagus, CKD stage III, former smoker, hyperlipidemia, prediabetes presents for follow-up.  Patient states that he is feeling well. Has had some cough and associated left-sided abdominal pain/flank pain.  Likely due to coughing.  Hypertension is well-controlled on enalapril and HCTZ.  He has an upcoming appointment with nephrology given proteinuria and CKD 3.  COPD stable on Trelegy.  Patient's lipids have improved significantly.  Last LDL was 74.  He is nearly at goal of less than 70.  He is on max dose Lipitor and Zetia.  For some reason, patient's echocardiogram was not done.  Will take care of this today.  Needs evaluation of the aortic valve given prior findings on CT lung cancer screening.  Patient states that his shortness of breath is at baseline.  No chest pain.  Patient Active Problem List   Diagnosis Date Noted   Lesion of right native kidney 09/09/2022   Stage 3a chronic kidney disease ([place]) 07/15/2022   Aortic valve disorder 07/15/2022   CAD (coronary artery disease) 07/15/2022   Prediabetes 03/04/2022   Former smoker 03/04/2022   Tubular adenoma of colon 01/26/2020   Barrett's esophagus 01/26/2020   COPD (chronic obstructive pulmonary disease) ([place]) 07/27/2018   Mixed hyperlipidemia 08/05/2012   Essential hypertension, benign 10/26/2008    Social Hx   Social History   Socioeconomic History   Marital status: Married    Spouse name: Not on file   Number of children: Not on file   Years of education: Not on file   Highest education level: Not on file   Occupational History   Not on file  Tobacco Use   Smoking status: Former    Packs/day: 1.25    Years: 51.00    Total pack years: 63.75    Types: Cigarettes    Quit date: 2018    Years since quitting: 6.0   Smokeless tobacco: Never  Vaping Use   Vaping Use: Never used  Substance and Sexual Activity   Alcohol use: Yes    Comment: 1-2 glasses per night   Drug use: No   Sexual activity: Not on file  Other Topics Concern   Not on file  Social History Narrative   Not on file   Social Determinants of Health   Financial Resource Strain: Low Risk  (06/04/2022)   Overall Financial Resource Strain (CARDIA)    Difficulty of Paying Living Expenses: Not hard at all  Food Insecurity: No Food Insecurity (06/04/2022)   Hunger Vital Sign    Worried About Running Out of Food in the Last Year: Never true    Ran Out of Food in the Last Year: Never true  Transportation Needs: No Transportation Needs (06/04/2022)   PRAPARE - Hydrologist (Medical): No    Lack of Transportation (Non-Medical): No  Physical Activity: Insufficiently Active (06/04/2022)   Exercise Vital Sign    Days of Exercise per Week: 3 days    Minutes of Exercise per Session: 30 min  Stress: No Stress Concern Present (06/04/2022)   [employer] - Occupational Stress Questionnaire    Feeling of Stress : Not at all  Social Connections: Moderately Isolated (06/04/2022)   Social Connection and Isolation Panel [NHANES]    Frequency of Communication with Friends and Family: More than three times a week    Frequency of Social Gatherings with Friends and Family: Never    Attends Religious Services: Never    Marine scientist or Organizations: No    Attends Music therapist: Never    Marital Status: Married    Review of Systems Per HPI  Objective:  BP 130/78   Pulse 61   Temp (!) 97.2 F (36.2 C)   Wt 236 lb 6.4 oz (107.2 kg)   SpO2 95%   BMI  33.92 kg/m      09/09/2022    8:15 AM 07/15/2022    8:14 AM 06/04/2022    8:56 AM  BP/Weight  Systolic BP 161 096   Diastolic BP 78 66   Wt. (Lbs) 236.4 233 224  BMI 33.92 kg/m2 33.43 kg/m2 35.08 kg/m2    Physical Exam Vitals and nursing note reviewed.  Constitutional:      General: He is not in acute distress.    Appearance: Normal appearance.  HENT:     Head: Normocephalic and atraumatic.  Eyes:     General:        Right eye: No discharge.        Left eye: No discharge.     Conjunctiva/sclera: Conjunctivae normal.  Cardiovascular:     Rate and Rhythm: Normal rate and regular rhythm.     Heart sounds: Murmur heard.  Pulmonary:     Effort: Pulmonary effort is normal.     Breath sounds: Normal breath sounds. No wheezing or rales.  Abdominal:     Palpations: Abdomen is soft.     Tenderness: There is no abdominal tenderness.  Neurological:     Mental Status: He is alert.  Psychiatric:        Mood and Affect: Mood normal.        Behavior: Behavior normal.     Lab Results  Component Value Date   WBC 7.1 03/05/2022   HGB 15.7 03/05/2022   HCT 46.0 03/05/2022   PLT 264 03/05/2022   GLUCOSE 120 (H) 07/15/2022   CHOL 183 07/15/2022   TRIG 81 07/15/2022   HDL 94 07/15/2022   LDLCALC 74 07/15/2022   ALT 27 03/05/2022   AST 30 03/05/2022   NA 143 07/15/2022   K 4.6 07/15/2022   CL 101 07/15/2022   CREATININE 1.31 (H) 07/15/2022   BUN 24 07/15/2022   CO2 23 07/15/2022   PSA 0.66 06/27/2014   HGBA1C 6.0 (H) 03/05/2022     Assessment & Plan:   Problem List Items Addressed This Visit       Cardiovascular and Mediastinum   Aortic valve disorder    Echo has been scheduled.      CAD (coronary artery disease)    Asymptomatic currently.  Will continue to monitor.       Essential hypertension, benign    Well-controlled on enalapril and HCTZ.        Respiratory   COPD (chronic obstructive pulmonary disease) ([facility])    Stable on Trelegy.  Continue.         Genitourinary   Lesion of right native kidney    Needs annual MRI (due 07/2023).       Stage 3a chronic kidney disease (  [facility])    Has upcoming appointment with nephrology in early February.        Other   Mixed hyperlipidemia    LDL nearly at goal.  Continue Lipitor and Zetia.       Follow-up:  Return in about 6 months (around 03/10/2023).  [place]

## 2022-09-09 NOTE — Assessment & Plan Note (Signed)
Asymptomatic currently.  Will continue to monitor.

## 2022-09-09 NOTE — Assessment & Plan Note (Signed)
LDL nearly at goal.  Continue Lipitor and Zetia.

## 2022-09-09 NOTE — Assessment & Plan Note (Signed)
Echo has been scheduled  

## 2022-09-09 NOTE — Assessment & Plan Note (Signed)
Stable on Trelegy.  Continue. 

## 2022-09-09 NOTE — Patient Instructions (Signed)
Continue your medications.  We are working on the Echo.  Follow up in 6 months.  Take care  Dr. Lacinda Axon

## 2022-09-09 NOTE — Assessment & Plan Note (Signed)
Needs annual MRI (due 07/2023).

## 2022-09-09 NOTE — Assessment & Plan Note (Signed)
Well-controlled on enalapril and HCTZ.

## 2022-09-09 NOTE — Assessment & Plan Note (Signed)
Has upcoming appointment with nephrology in early February.

## 2022-09-24 ENCOUNTER — Ambulatory Visit (HOSPITAL_COMMUNITY)
Admission: RE | Admit: 2022-09-24 | Discharge: 2022-09-24 | Disposition: A | Discharge status: Home / Self Care | Payer: PPO | Source: Ambulatory Visit | Attending: Family Medicine | Admitting: Family Medicine

## 2022-09-24 DIAGNOSIS — I350 Nonrheumatic aortic (valve) stenosis: Secondary | ICD-10-CM

## 2022-09-24 DIAGNOSIS — I359 Nonrheumatic aortic valve disorder, unspecified: Secondary | ICD-10-CM | POA: Diagnosis not present

## 2022-09-24 LAB — ECHOCARDIOGRAM COMPLETE
AR max vel: 1.18 cm2
AV Area VTI: 1.26 cm2
AV Area mean vel: 1.27 cm2
AV Mean grad: 12.5 mmHg
AV Peak grad: 22.5 mmHg
Ao pk vel: 2.37 m/s
Area-P 1/2: 1.75 cm2
S' Lateral: 3.7 cm

## 2022-09-24 NOTE — Progress Notes (Signed)
*  PRELIMINARY RESULTS* Echocardiogram 2D Echocardiogram has been performed.  Jerome Sanchez 09/24/2022, 12:09 PM

## 2022-09-25 ENCOUNTER — Other Ambulatory Visit (HOSPITAL_COMMUNITY): Payer: Self-pay | Admitting: Nephrology

## 2022-09-25 DIAGNOSIS — I5032 Chronic diastolic (congestive) heart failure: Secondary | ICD-10-CM | POA: Diagnosis not present

## 2022-09-25 DIAGNOSIS — N281 Cyst of kidney, acquired: Secondary | ICD-10-CM | POA: Diagnosis not present

## 2022-09-25 DIAGNOSIS — K760 Fatty (change of) liver, not elsewhere classified: Secondary | ICD-10-CM | POA: Diagnosis not present

## 2022-09-25 DIAGNOSIS — Z6835 Body mass index (BMI) 35.0-35.9, adult: Secondary | ICD-10-CM | POA: Diagnosis not present

## 2022-09-25 DIAGNOSIS — R809 Proteinuria, unspecified: Secondary | ICD-10-CM

## 2022-09-25 DIAGNOSIS — R0683 Snoring: Secondary | ICD-10-CM | POA: Diagnosis not present

## 2022-09-25 DIAGNOSIS — I129 Hypertensive chronic kidney disease with stage 1 through stage 4 chronic kidney disease, or unspecified chronic kidney disease: Secondary | ICD-10-CM | POA: Diagnosis not present

## 2022-09-25 DIAGNOSIS — N1831 Chronic kidney disease, stage 3a: Secondary | ICD-10-CM | POA: Diagnosis not present

## 2022-09-30 DIAGNOSIS — Z6835 Body mass index (BMI) 35.0-35.9, adult: Secondary | ICD-10-CM | POA: Diagnosis not present

## 2022-09-30 DIAGNOSIS — I129 Hypertensive chronic kidney disease with stage 1 through stage 4 chronic kidney disease, or unspecified chronic kidney disease: Secondary | ICD-10-CM | POA: Diagnosis not present

## 2022-09-30 DIAGNOSIS — K760 Fatty (change of) liver, not elsewhere classified: Secondary | ICD-10-CM | POA: Diagnosis not present

## 2022-09-30 DIAGNOSIS — R809 Proteinuria, unspecified: Secondary | ICD-10-CM | POA: Diagnosis not present

## 2022-09-30 DIAGNOSIS — N281 Cyst of kidney, acquired: Secondary | ICD-10-CM | POA: Diagnosis not present

## 2022-09-30 DIAGNOSIS — R0683 Snoring: Secondary | ICD-10-CM | POA: Diagnosis not present

## 2022-09-30 DIAGNOSIS — I5032 Chronic diastolic (congestive) heart failure: Secondary | ICD-10-CM | POA: Diagnosis not present

## 2022-09-30 DIAGNOSIS — N1831 Chronic kidney disease, stage 3a: Secondary | ICD-10-CM | POA: Diagnosis not present

## 2022-10-03 DIAGNOSIS — B351 Tinea unguium: Secondary | ICD-10-CM | POA: Diagnosis not present

## 2022-10-03 DIAGNOSIS — N183 Chronic kidney disease, stage 3 unspecified: Secondary | ICD-10-CM | POA: Diagnosis not present

## 2022-10-03 DIAGNOSIS — G8929 Other chronic pain: Secondary | ICD-10-CM | POA: Diagnosis not present

## 2022-10-03 DIAGNOSIS — K219 Gastro-esophageal reflux disease without esophagitis: Secondary | ICD-10-CM | POA: Diagnosis not present

## 2022-10-03 DIAGNOSIS — Z87891 Personal history of nicotine dependence: Secondary | ICD-10-CM | POA: Diagnosis not present

## 2022-10-03 DIAGNOSIS — J449 Chronic obstructive pulmonary disease, unspecified: Secondary | ICD-10-CM | POA: Diagnosis not present

## 2022-10-03 DIAGNOSIS — M1990 Unspecified osteoarthritis, unspecified site: Secondary | ICD-10-CM | POA: Diagnosis not present

## 2022-10-03 DIAGNOSIS — U099 Post covid-19 condition, unspecified: Secondary | ICD-10-CM | POA: Diagnosis not present

## 2022-10-03 DIAGNOSIS — I10 Essential (primary) hypertension: Secondary | ICD-10-CM | POA: Diagnosis not present

## 2022-10-03 DIAGNOSIS — E785 Hyperlipidemia, unspecified: Secondary | ICD-10-CM | POA: Diagnosis not present

## 2022-10-03 DIAGNOSIS — M5136 Other intervertebral disc degeneration, lumbar region: Secondary | ICD-10-CM | POA: Diagnosis not present

## 2022-10-06 ENCOUNTER — Ambulatory Visit (HOSPITAL_COMMUNITY)
Admission: RE | Admit: 2022-10-06 | Discharge: 2022-10-06 | Disposition: A | Discharge status: Home / Self Care | Payer: PPO | Source: Ambulatory Visit | Attending: Nephrology | Admitting: Nephrology

## 2022-10-06 DIAGNOSIS — N189 Chronic kidney disease, unspecified: Secondary | ICD-10-CM | POA: Diagnosis not present

## 2022-10-06 DIAGNOSIS — R809 Proteinuria, unspecified: Secondary | ICD-10-CM | POA: Insufficient documentation

## 2022-10-06 DIAGNOSIS — N1831 Chronic kidney disease, stage 3a: Secondary | ICD-10-CM | POA: Insufficient documentation

## 2022-10-06 DIAGNOSIS — N2889 Other specified disorders of kidney and ureter: Secondary | ICD-10-CM | POA: Diagnosis not present

## 2022-10-06 DIAGNOSIS — N281 Cyst of kidney, acquired: Secondary | ICD-10-CM | POA: Diagnosis not present

## 2022-10-13 ENCOUNTER — Telehealth: Payer: Self-pay

## 2022-10-13 ENCOUNTER — Other Ambulatory Visit: Payer: Self-pay | Admitting: Family Medicine

## 2022-10-13 MED ORDER — NIRMATRELVIR/RITONAVIR (PAXLOVID) TABLET (RENAL DOSING): ORAL_TABLET | ORAL | 0 refills | Status: DC

## 2022-10-13 NOTE — Telephone Encounter (Signed)
Coral Spikes, DO     Rx sent for paxlovid (renal dosing due to GFR).

## 2022-10-13 NOTE — Telephone Encounter (Signed)
Patient notified

## 2022-10-13 NOTE — Telephone Encounter (Signed)
Pt tested positive for Covid today has congestion, cough wants to know what he needs to do   [facility] [place] [facility] [phone]

## 2022-10-24 DIAGNOSIS — R778 Other specified abnormalities of plasma proteins: Secondary | ICD-10-CM | POA: Diagnosis not present

## 2022-10-24 DIAGNOSIS — E1129 Type 2 diabetes mellitus with other diabetic kidney complication: Secondary | ICD-10-CM | POA: Diagnosis not present

## 2022-10-24 DIAGNOSIS — N281 Cyst of kidney, acquired: Secondary | ICD-10-CM | POA: Diagnosis not present

## 2022-10-24 DIAGNOSIS — E559 Vitamin D deficiency, unspecified: Secondary | ICD-10-CM | POA: Diagnosis not present

## 2022-10-24 DIAGNOSIS — I129 Hypertensive chronic kidney disease with stage 1 through stage 4 chronic kidney disease, or unspecified chronic kidney disease: Secondary | ICD-10-CM | POA: Diagnosis not present

## 2022-10-24 DIAGNOSIS — E1122 Type 2 diabetes mellitus with diabetic chronic kidney disease: Secondary | ICD-10-CM | POA: Diagnosis not present

## 2022-10-24 DIAGNOSIS — R809 Proteinuria, unspecified: Secondary | ICD-10-CM | POA: Diagnosis not present

## 2022-10-24 DIAGNOSIS — N189 Chronic kidney disease, unspecified: Secondary | ICD-10-CM | POA: Diagnosis not present

## 2022-11-03 ENCOUNTER — Ambulatory Visit: Payer: PPO | Admitting: Adult Health

## 2022-11-03 ENCOUNTER — Encounter: Payer: Self-pay | Admitting: Adult Health

## 2022-11-03 VITALS — BP 108/70 | HR 61 | Temp 97.8°F | Ht 70.0 in | Wt 229.8 lb

## 2022-11-03 DIAGNOSIS — R0683 Snoring: Secondary | ICD-10-CM | POA: Diagnosis not present

## 2022-11-03 DIAGNOSIS — J449 Chronic obstructive pulmonary disease, unspecified: Secondary | ICD-10-CM | POA: Diagnosis not present

## 2022-11-03 NOTE — Assessment & Plan Note (Signed)
Continue with lung cancer screening program. Continue current regimen followed by primary care.

## 2022-11-03 NOTE — Patient Instructions (Addendum)
Set up for home sleep study  Work on healthy sleep regimen  Do not drive if sleepy  Work on healthy weight loss  Follow up in 6 weeks to discuss sleep study results and treatment plan .

## 2022-11-03 NOTE — Progress Notes (Signed)
@Patient  ID: [name], male    DOB: 1951-03-11, 72 y.o.   MRN: [id number]  Chief Complaint  Patient presents with   Consult    Referring provider: Coral Spikes, DO  HPI: 72 year old male seen for sleep consult November 03, 2022 for loud snoring, restless sleep, daytime sleepiness, witnessed apneic events  TEST/EVENTS :   11/03/2022 Sleep consult  Patient presents for a sleep consult.  Kindly referred by Dr. Theador Hawthorne.  Patient complains of loud snoring, restless sleep, daytime sleepiness, witnessed apneic events.  Patient typically goes to bed about 930 to 11 PM.  Takes only a few minutes to go to sleep.  Is up of several times throughout the night.  Gets up at 8 AM.  Weight is up 15 pounds over the last 2 years.  Current weight is at 229 pounds with a BMI at 32.  Patient has no history of congestive heart failure or stroke.  Patient does have underlying COPD that is managed by his primary care provider.  He has not on any oxygen.  Does participate in the lung cancer screening program.  Loud snoring has been worse for the last 6 months.  Rarely takes melatonin.  Does not feel like it ever worked.  He does have removable dental work.  Patient is retired.  Takes in 3 cups of coffee daily.  No symptoms suspicious for cataplexy or sleep paralysis.  Has never had a sleep study before.  Patient is followed by cardiology has a history of mild to moderate aortic valve stenosis. Epworth score is 3 out of 24.  Typically gets sleepy if he is a passenger of a car and watching TV.   Allergies  Allergen Reactions   Penicillins     N&V    Immunization History  Administered Date(s) Administered   Covid-19, Mrna,Vaccine(Spikevax)66yrs and older 07/27/2022   Fluad Quad(high Dose 65+) 05/18/2020, 07/27/2022   Influenza,inj,Quad PF,6+ Mos 07/27/2018, 08/27/2021   Influenza-Unspecified 06/18/2013, 06/08/2015, 06/02/2016, 06/15/2019   Moderna Sars-Covid-2 Vaccination 09/08/2019, 10/14/2019, 07/28/2020,  06/02/2021   PNEUMOCOCCAL CONJUGATE-20 07/27/2022   Pneumococcal Conjugate-13 07/29/2019   Pneumococcal Polysaccharide-23 07/15/2016   RSV,unspecified 07/27/2022    Past Medical History:  Diagnosis Date   Essential hypertension, benign    Impaired glucose tolerance    Injury of right rotator cuff    Lumbar disc disease    Mixed hyperlipidemia    Medical history significant for hypertension, hyperlipidemia, chronic kidney disease, COPD, prediabetes, GERD, mild to moderate aortic valve stenosis.  Surgical history none  Social history patient is married lives with his husband.  Has adult children.  Quit smoking in 2019.  Drinks 2 glasses of wine daily.  No drug use.   Family history positive for diabetes.   Tobacco History: Social History   Tobacco Use  Smoking Status Former   Packs/day: 1.25   Years: 51.00   Additional pack years: 0.00   Total pack years: 63.75   Types: Cigarettes   Quit date: 2018   Years since quitting: 6.2  Smokeless Tobacco Never   Counseling given: Not Answered   Outpatient Medications Prior to Visit  Medication Sig Dispense Refill   albuterol (VENTOLIN HFA) 108 (90 Base) MCG/ACT inhaler INHALE 1 TO 2 PUFFS BY MOUTH EVERY 6 HOURS AS NEEDED FOR WHEEZING FOR SHORTNESS OF BREATH 9 g 0   atorvastatin (LIPITOR) 80 MG tablet Take 1 tablet (80 mg total) by mouth daily. 100 tablet 1   dapagliflozin propanediol (FARXIGA) 5 MG TABS  tablet Take 5 mg by mouth daily.     enalapril (VASOTEC) 20 MG tablet TAKE 1 TABLET BY MOUTH TWICE DAILY 200 tablet 1   ergocalciferol (VITAMIN D2) 1.25 MG (50000 UT) capsule Take 50,000 Units by mouth once a week.     ezetimibe (ZETIA) 10 MG tablet Take 1 tablet (10 mg total) by mouth daily. 100 tablet 1   Fluticasone-Umeclidin-Vilant (TRELEGY ELLIPTA) 100-62.5-25 MCG/ACT AEPB INHALE 1 PUFF INTO LUNGS ONCE DAILY 60 each 1   hydrochlorothiazide (HYDRODIURIL) 25 MG tablet Take one tablet po daily 100 tablet 1   lansoprazole  (PREVACID) 15 MG capsule Take 1 capsule (15 mg total) by mouth as needed. 100 capsule 1   Magnesium 400 MG TABS Take 1 tablet by mouth daily.     naproxen sodium (ANAPROX) 220 MG tablet Take 220 mg by mouth as needed.     OVER THE COUNTER MEDICATION Areds ( omega 3, lutein, zeaxanthin) take one a day     Potassium 99 MG TABS Take by mouth daily.     magnesium oxide (MAG-OX) 400 MG tablet Take 400 mg by mouth daily.     nirmatrelvir/ritonavir, renal dosing, (PAXLOVID) 10 x 150 MG & 10 x 100MG  TABS Take nirmatrelvir 150 mg one tablet twice daily for 5 days and ritonavir 100 mg one tablet twice daily for 5 days. Patient GFR is 58. 20 tablet 0   OVER THE COUNTER MEDICATION Zyflamend - Combination of herbal supplement - patient takes 1 daily.     Facility-Administered Medications Prior to Visit  Medication Dose Route Frequency Provider Last Rate Last Admin   methylPREDNISolone acetate (DEPO-MEDROL) injection 40 mg  40 mg Intra-articular Once Mikey Kirschner, MD         Review of Systems:   Constitutional:   No  weight loss, night sweats,  Fevers, chills,  +fatigue, or  lassitude.  HEENT:   No headaches,  Difficulty swallowing,  Tooth/dental problems, or  Sore throat,                No sneezing, itching, ear ache, nasal congestion, post nasal drip,   CV:  No chest pain,  Orthopnea, PND, swelling in lower extremities, anasarca, dizziness, palpitations, syncope.   GI  No heartburn, indigestion, abdominal pain, nausea, vomiting, diarrhea, change in bowel habits, loss of appetite, bloody stools.   Resp: No shortness of breath with exertion or at rest.  No excess mucus, no productive cough,  No non-productive cough,  No coughing up of blood.  No change in color of mucus.  No wheezing.  No chest wall deformity  Skin: no rash or lesions.  GU: no dysuria, change in color of urine, no urgency or frequency.  No flank pain, no hematuria   MS:  No joint pain or swelling.  No decreased range of  motion.  No back pain.    Physical Exam  BP 108/70 (BP Location: Left Arm, Patient Position: Sitting, Cuff Size: Large)   Pulse 61   Temp 97.8 F (36.6 C) (Oral)   Ht 5\' 10"  (1.778 m)   Wt 229 lb 12.8 oz (104.2 kg)   SpO2 94%   BMI 32.97 kg/m   GEN: A/Ox3; pleasant , NAD, well nourished    HEENT:  [place]/AT,   NOSE-clear, THROAT-clear, no lesions, no postnasal drip or exudate noted.  Class III MP airway  NECK:  Supple w/ fair ROM; no JVD; normal carotid impulses w/o bruits; no thyromegaly or nodules palpated; no lymphadenopathy.  RESP  Clear  P & A; w/o, wheezes/ rales/ or rhonchi. no accessory muscle use, no dullness to percussion  CARD:  RRR, no m/r/g, no peripheral edema, pulses intact, no cyanosis or clubbing.  GI:   Soft & nt; nml bowel sounds; no organomegaly or masses detected.   Musco: Warm bil, no deformities or joint swelling noted.   Neuro: alert, no focal deficits noted.    Skin: Warm, no lesions or rashes    Lab Results:      BNP No results found for: "BNP"  ProBNP No results found for: "PROBNP"  Imaging:         No data to display          No results found for: "NITRICOXIDE"      Assessment & Plan:   Snoring Snoring, daytime sleepiness, restless sleep, witnessed apneic events all suspicious for underlying sleep apnea.  Will set patient up for home sleep study.  Patient education given on sleep apnea.  - discussed how weight can impact sleep and risk for sleep disordered breathing - discussed options to assist with weight loss: combination of diet modification, cardiovascular and strength training exercises   - had an extensive discussion regarding the adverse health consequences related to untreated sleep disordered breathing - specifically discussed the risks for hypertension, coronary artery disease, cardiac dysrhythmias, cerebrovascular disease, and diabetes - lifestyle modification discussed   - discussed how sleep  disruption can increase risk of accidents, particularly when driving - safe driving practices were discussed   Plan  Patient Instructions  Set up for home sleep study  Work on healthy sleep regimen  Do not drive if sleepy  Work on healthy weight loss  Follow up in 6 weeks to discuss sleep study results and treatment plan .     COPD (chronic obstructive pulmonary disease) ([place]) Continue with lung cancer screening program. Continue current regimen followed by primary care.    Rexene Edison, NP 11/03/2022

## 2022-11-03 NOTE — Assessment & Plan Note (Signed)
Snoring, daytime sleepiness, restless sleep, witnessed apneic events all suspicious for underlying sleep apnea.  Will set patient up for home sleep study.  Patient education given on sleep apnea.  - discussed how weight can impact sleep and risk for sleep disordered breathing - discussed options to assist with weight loss: combination of diet modification, cardiovascular and strength training exercises   - had an extensive discussion regarding the adverse health consequences related to untreated sleep disordered breathing - specifically discussed the risks for hypertension, coronary artery disease, cardiac dysrhythmias, cerebrovascular disease, and diabetes - lifestyle modification discussed   - discussed how sleep disruption can increase risk of accidents, particularly when driving - safe driving practices were discussed   Plan  Patient Instructions  Set up for home sleep study  Work on healthy sleep regimen  Do not drive if sleepy  Work on healthy weight loss  Follow up in 6 weeks to discuss sleep study results and treatment plan .

## 2022-11-04 NOTE — Progress Notes (Signed)
Reviewed and agree with assessment/plan.   Chesley Mires, MD [facility] Pulmonary/Critical Care 11/04/2022, 8:09 AM Pager:  [phone]

## 2022-11-05 ENCOUNTER — Encounter: Payer: Self-pay | Admitting: Family Medicine

## 2022-11-06 ENCOUNTER — Ambulatory Visit (HOSPITAL_COMMUNITY)
Admission: RE | Admit: 2022-11-06 | Discharge: 2022-11-06 | Disposition: A | Discharge status: Home / Self Care | Payer: PPO | Source: Ambulatory Visit | Attending: Family Medicine | Admitting: Family Medicine

## 2022-11-06 ENCOUNTER — Ambulatory Visit (INDEPENDENT_AMBULATORY_CARE_PROVIDER_SITE_OTHER): Payer: PPO | Admitting: Family Medicine

## 2022-11-06 VITALS — BP 116/81 | Temp 97.6°F | Ht 70.0 in | Wt 230.0 lb

## 2022-11-06 DIAGNOSIS — K5732 Diverticulitis of large intestine without perforation or abscess without bleeding: Secondary | ICD-10-CM | POA: Diagnosis not present

## 2022-11-06 DIAGNOSIS — R1032 Left lower quadrant pain: Secondary | ICD-10-CM | POA: Insufficient documentation

## 2022-11-06 DIAGNOSIS — K5792 Diverticulitis of intestine, part unspecified, without perforation or abscess without bleeding: Secondary | ICD-10-CM | POA: Diagnosis not present

## 2022-11-06 DIAGNOSIS — N200 Calculus of kidney: Secondary | ICD-10-CM | POA: Diagnosis not present

## 2022-11-06 MED ORDER — CIPROFLOXACIN HCL 500 MG PO TABS: 500.0000 mg | ORAL_TABLET | Freq: Two times a day (BID) | ORAL | 0 refills | Status: DC

## 2022-11-06 MED ORDER — METRONIDAZOLE 500 MG PO TABS: 500.0000 mg | ORAL_TABLET | Freq: Three times a day (TID) | ORAL | 0 refills | Status: AC

## 2022-11-06 NOTE — Assessment & Plan Note (Addendum)
Addendum: CT revealed diverticulitis of the descending colon. Treating with Cipro and Flagyl.  CT also revealed pericecal stranding and thickening of the cecum is of uncertains significance. I'm not sure what to make of this as patient is nontoxic and clinically I do not suspect typhlitis. Patient was started on antibiotic therapy. Planning for close follow up.

## 2022-11-06 NOTE — Assessment & Plan Note (Signed)
Suspected diverticulitis.  Needs CT imaging to confirm. Arranging stat CT today.

## 2022-11-06 NOTE — Progress Notes (Addendum)
Subjective:  Patient ID: [name], male    DOB: Nov 27, 1950  Age: 72 y.o. MRN: [id number]  CC: Chief Complaint  Patient presents with   left side pain - worse since weekend    HPI:  72 year old male with a complicated past medical history including coronary disease, hypertension, COPD, stage III chronic kidney disease, multiple renal cysts presents for evaluation of left flank/left lower quadrant pain.  Patient's had recent pain but worsened over the weekend and earlier this week.  He reports left flank and left lower quadrant pain.  He has had some loose stool.  No fever.  No nausea or vomiting.  No reports of hematuria.  Pain has worsened significantly prompting his visit today.  His pain is sometimes worse with certain movements.  He has never had anything like this before.  Patient Active Problem List   Diagnosis Date Noted   Left lower quadrant abdominal pain 11/06/2022   Diverticulitis 11/06/2022   Snoring 11/03/2022   Lesion of right native kidney 09/09/2022   Stage 3a chronic kidney disease ([place]) 07/15/2022   Aortic valve disorder 07/15/2022   CAD (coronary artery disease) 07/15/2022   Prediabetes 03/04/2022   Former smoker 03/04/2022   Tubular adenoma of colon 01/26/2020   Barrett's esophagus 01/26/2020   COPD (chronic obstructive pulmonary disease) ([place]) 07/27/2018   Mixed hyperlipidemia 08/05/2012   Essential hypertension, benign 10/26/2008    Social Hx   Social History   Socioeconomic History   Marital status: Married    Spouse name: Not on file   Number of children: Not on file   Years of education: Not on file   Highest education level: Bachelor's degree (e.g., BA, AB, BS)  Occupational History   Not on file  Tobacco Use   Smoking status: Former    Packs/day: 1.25    Years: 51.00    Additional pack years: 0.00    Total pack years: 63.75    Types: Cigarettes    Quit date: 2018    Years since quitting: 6.2   Smokeless tobacco: Never  Vaping Use    Vaping Use: Never used  Substance and Sexual Activity   Alcohol use: Yes    Comment: 1-2 glasses per night   Drug use: No   Sexual activity: Not on file  Other Topics Concern   Not on file  Social History Narrative   Not on file   Social Determinants of Health   Financial Resource Strain: Low Risk  (11/06/2022)   Overall Financial Resource Strain (CARDIA)    Difficulty of Paying Living Expenses: Not very hard  Food Insecurity: No Food Insecurity (11/06/2022)   Hunger Vital Sign    Worried About Running Out of Food in the Last Year: Never true    Ran Out of Food in the Last Year: Never true  Transportation Needs: No Transportation Needs (11/06/2022)   PRAPARE - Hydrologist (Medical): No    Lack of Transportation (Non-Medical): No  Physical Activity: Insufficiently Active (11/06/2022)   Exercise Vital Sign    Days of Exercise per Week: 3 days    Minutes of Exercise per Session: 20 min  Stress: No Stress Concern Present (11/06/2022)   [place]    Feeling of Stress : Only a little  Social Connections: Moderately Isolated (11/06/2022)   Social Connection and Isolation Panel [NHANES]    Frequency of Communication with Friends and Family: Three times  a week    Frequency of Social Gatherings with Friends and Family: Once a week    Attends Religious Services: Never    Marine scientist or Organizations: No    Attends Music therapist: Never    Marital Status: Married    Review of Systems Per HPI  Objective:  BP 116/81   Temp 97.6 F (36.4 C) (Oral)   Ht 5\' 10"  (1.778 m)   Wt 230 lb (104.3 kg)   BMI 33.00 kg/m      11/06/2022    4:36 PM 11/03/2022   10:55 AM 09/09/2022    8:15 AM  BP/Weight  Systolic BP [id number]  Diastolic BP 81 70 78  Wt. (Lbs) 230 229.8 236.4  BMI 33 kg/m2 32.97 kg/m2 33.92 kg/m2    Physical Exam Vitals and nursing note reviewed.   Constitutional:      General: He is not in acute distress.    Appearance: Normal appearance. He is obese.  HENT:     Head: Normocephalic and atraumatic.  Cardiovascular:     Rate and Rhythm: Normal rate and regular rhythm.  Pulmonary:     Effort: Pulmonary effort is normal.     Breath sounds: Normal breath sounds. No wheezing, rhonchi or rales.  Abdominal:     Comments: Soft, nondistended.  Exquisitely tender in the left lower quadrant.  Neurological:     Mental Status: He is alert.  Psychiatric:        Mood and Affect: Mood normal.        Behavior: Behavior normal.     Lab Results  Component Value Date   WBC 7.1 03/05/2022   HGB 15.7 03/05/2022   HCT 46.0 03/05/2022   PLT 264 03/05/2022   GLUCOSE 120 (H) 07/15/2022   CHOL 183 07/15/2022   TRIG 81 07/15/2022   HDL 94 07/15/2022   LDLCALC 74 07/15/2022   ALT 27 03/05/2022   AST 30 03/05/2022   NA 143 07/15/2022   K 4.6 07/15/2022   CL 101 07/15/2022   CREATININE 1.31 (H) 07/15/2022   BUN 24 07/15/2022   CO2 23 07/15/2022   PSA 0.66 06/27/2014   HGBA1C 6.0 (H) 03/05/2022     Assessment & Plan:   Problem List Items Addressed This Visit       Other   Diverticulitis    Addendum: CT revealed diverticulitis of the descending colon. Treating with Cipro and Flagyl.  CT also revealed pericecal stranding and thickening of the cecum is of uncertains significance. I'm not sure what to make of this as patient is nontoxic and clinically I do not suspect typhlitis. Patient was started on antibiotic therapy. Planning for close follow up.      Left lower quadrant abdominal pain - Primary    Suspected diverticulitis.  Needs CT imaging to confirm. Arranging stat CT today.      Relevant Orders   CT Abdomen Pelvis Wo Contrast (Completed)   Meds ordered this encounter  Medications   ciprofloxacin (CIPRO) 500 MG tablet    Sig: Take 1 tablet (500 mg total) by mouth 2 (two) times daily.    Dispense:  14 tablet    Refill:   0   metroNIDAZOLE (FLAGYL) 500 MG tablet    Sig: Take 1 tablet (500 mg total) by mouth 3 (three) times daily for 7 days.    Dispense:  21 tablet    Refill:  0   Follow-up:  2 weeks -  1 month.  [place]

## 2022-11-19 DIAGNOSIS — Z961 Presence of intraocular lens: Secondary | ICD-10-CM | POA: Diagnosis not present

## 2022-11-19 DIAGNOSIS — H35371 Puckering of macula, right eye: Secondary | ICD-10-CM | POA: Diagnosis not present

## 2022-11-19 DIAGNOSIS — H43813 Vitreous degeneration, bilateral: Secondary | ICD-10-CM | POA: Diagnosis not present

## 2022-11-24 ENCOUNTER — Other Ambulatory Visit: Payer: Self-pay | Admitting: Family Medicine

## 2022-11-25 ENCOUNTER — Ambulatory Visit (INDEPENDENT_AMBULATORY_CARE_PROVIDER_SITE_OTHER): Payer: PPO | Admitting: Family Medicine

## 2022-11-25 VITALS — BP 118/78 | HR 83 | Temp 98.6°F | Ht 70.0 in | Wt 229.0 lb

## 2022-11-25 DIAGNOSIS — K5792 Diverticulitis of intestine, part unspecified, without perforation or abscess without bleeding: Secondary | ICD-10-CM | POA: Diagnosis not present

## 2022-11-25 NOTE — Progress Notes (Signed)
Subjective:  Patient ID: Jerome Sanchez, male    DOB: 1951-02-21  Age: 72 y.o. MRN: [id number]  CC: Chief Complaint  Patient presents with   LLQ abd pain     Follow up     HPI:  72 year old male with the below mentioned past medical history presents for follow-up regarding recent diverticulitis.  He has completed his antibiotic course.  He is doing well at this time.  No abdominal pain.  No nausea, vomiting, diarrhea.  No fever.  Normal appetite.  Patient Active Problem List   Diagnosis Date Noted   Diverticulitis 11/06/2022   Snoring 11/03/2022   Lesion of right native kidney 09/09/2022   Stage 3a chronic kidney disease 07/15/2022   Aortic valve disorder 07/15/2022   CAD (coronary artery disease) 07/15/2022   Prediabetes 03/04/2022   Former smoker 03/04/2022   Barrett's esophagus 01/26/2020   COPD (chronic obstructive pulmonary disease) 07/27/2018   Mixed hyperlipidemia 08/05/2012   Essential hypertension, benign 10/26/2008    Social Hx   Social History   Socioeconomic History   Marital status: Married    Spouse name: Not on file   Number of children: Not on file   Years of education: Not on file   Highest education level: Bachelor's degree (e.g., BA, AB, BS)  Occupational History   Not on file  Tobacco Use   Smoking status: Former    Packs/day: 1.25    Years: 51.00    Additional pack years: 0.00    Total pack years: 63.75    Types: Cigarettes    Quit date: 2018    Years since quitting: 6.2   Smokeless tobacco: Never  Vaping Use   Vaping Use: Never used  Substance and Sexual Activity   Alcohol use: Yes    Comment: 1-2 glasses per night   Drug use: No   Sexual activity: Not on file  Other Topics Concern   Not on file  Social History Narrative   Not on file   Social Determinants of Health   Financial Resource Strain: Low Risk  (11/06/2022)   Overall Financial Resource Strain (CARDIA)    Difficulty of Paying Living Expenses: Not very hard  Food  Insecurity: No Food Insecurity (11/06/2022)   Hunger Vital Sign    Worried About Running Out of Food in the Last Year: Never true    Ran Out of Food in the Last Year: Never true  Transportation Needs: No Transportation Needs (11/06/2022)   PRAPARE - Administrator, Civil Service (Medical): No    Lack of Transportation (Non-Medical): No  Physical Activity: Insufficiently Active (11/06/2022)   Exercise Vital Sign    Days of Exercise per Week: 3 days    Minutes of Exercise per Session: 20 min  Stress: No Stress Concern Present (11/06/2022)   [employer] of Occupational Health - Occupational Stress Questionnaire    Feeling of Stress : Only a little  Social Connections: Moderately Isolated (11/06/2022)   Social Connection and Isolation Panel [NHANES]    Frequency of Communication with Friends and Family: Three times a week    Frequency of Social Gatherings with Friends and Family: Once a week    Attends Religious Services: Never    Database administrator or Organizations: No    Attends Banker Meetings: Never    Marital Status: Married    Review of Systems Per HPI  Objective:  BP 118/78   Pulse 83   Temp 98.6 F (  37 C)   Ht 5\' 10"  (1.778 m)   Wt 229 lb (103.9 kg)   SpO2 94%   BMI 32.86 kg/m      11/25/2022    9:21 AM 11/06/2022    4:36 PM 11/03/2022   10:55 AM  BP/Weight  Systolic BP 118 116 108  Diastolic BP 78 81 70  Wt. (Lbs) 229 230 229.8  BMI 32.86 kg/m2 33 kg/m2 32.97 kg/m2    Physical Exam Vitals and nursing note reviewed.  Constitutional:      General: He is not in acute distress.    Appearance: Normal appearance.  HENT:     Head: Normocephalic and atraumatic.  Cardiovascular:     Rate and Rhythm: Normal rate and regular rhythm.  Pulmonary:     Effort: Pulmonary effort is normal. No respiratory distress.  Abdominal:     General: There is no distension.     Palpations: Abdomen is soft.     Tenderness: There is no abdominal  tenderness. There is no guarding or rebound.  Neurological:     Mental Status: He is alert.  Psychiatric:        Mood and Affect: Mood normal.        Behavior: Behavior normal.     Lab Results  Component Value Date   WBC 7.1 03/05/2022   HGB 15.7 03/05/2022   HCT 46.0 03/05/2022   PLT 264 03/05/2022   GLUCOSE 120 (H) 07/15/2022   CHOL 183 07/15/2022   TRIG 81 07/15/2022   HDL 94 07/15/2022   LDLCALC 74 07/15/2022   ALT 27 03/05/2022   AST 30 03/05/2022   NA 143 07/15/2022   K 4.6 07/15/2022   CL 101 07/15/2022   CREATININE 1.31 (H) 07/15/2022   BUN 24 07/15/2022   CO2 23 07/15/2022   PSA 0.66 06/27/2014   HGBA1C 6.0 (H) 03/05/2022     Assessment & Plan:   Problem List Items Addressed This Visit       Other   Diverticulitis - Primary    Now resolved.  Doing well at this time. Patient does need repeat abdominal MRI in the near future regarding kidney lesions.  This will allow me to reassess the area that was noted around the cecum on recent CT scan.       Follow-up: Has scheduled follow-up in July.  Everlene Other DO [facility]

## 2022-11-25 NOTE — Patient Instructions (Addendum)
Continue your medications.  Follow up in 2 months.

## 2022-11-25 NOTE — Assessment & Plan Note (Signed)
Now resolved.  Doing well at this time. Patient does need repeat abdominal MRI in the near future regarding kidney lesions.  This will allow me to reassess the area that was noted around the cecum on recent CT scan.

## 2022-12-18 ENCOUNTER — Ambulatory Visit: Payer: PPO | Admitting: Adult Health

## 2022-12-19 DIAGNOSIS — G4730 Sleep apnea, unspecified: Secondary | ICD-10-CM | POA: Diagnosis not present

## 2022-12-29 ENCOUNTER — Other Ambulatory Visit: Payer: Self-pay | Admitting: Family Medicine

## 2023-01-01 ENCOUNTER — Encounter (INDEPENDENT_AMBULATORY_CARE_PROVIDER_SITE_OTHER): Payer: PPO

## 2023-01-01 DIAGNOSIS — R0683 Snoring: Secondary | ICD-10-CM

## 2023-01-01 DIAGNOSIS — G4733 Obstructive sleep apnea (adult) (pediatric): Secondary | ICD-10-CM

## 2023-01-13 ENCOUNTER — Encounter: Payer: Self-pay | Admitting: Adult Health

## 2023-01-13 ENCOUNTER — Ambulatory Visit (INDEPENDENT_AMBULATORY_CARE_PROVIDER_SITE_OTHER): Payer: PPO | Admitting: Adult Health

## 2023-01-13 VITALS — BP 108/70 | HR 99 | Ht 70.0 in | Wt 231.4 lb

## 2023-01-13 DIAGNOSIS — G4733 Obstructive sleep apnea (adult) (pediatric): Secondary | ICD-10-CM | POA: Diagnosis not present

## 2023-01-13 NOTE — Assessment & Plan Note (Signed)
Moderate to severe sleep apnea.  Patient education was given on sleep study results.  We went over treatment options.  Patient will proceed with CPAP therapy will begin auto CPAP 5 to 15 cm H2O.  Will use a DreamWear nasal mask.  - discussed how weight can impact sleep and risk for sleep disordered breathing - discussed options to assist with weight loss: combination of diet modification, cardiovascular and strength training exercises   - had an extensive discussion regarding the adverse health consequences related to untreated sleep disordered breathing - specifically discussed the risks for hypertension, coronary artery disease, cardiac dysrhythmias, cerebrovascular disease, and diabetes - lifestyle modification discussed   - discussed how sleep disruption can increase risk of accidents, particularly when driving - safe driving practices were discussed   Plan  Patient Instructions  Begin CPAP At bedtime, wear all night long for at least 6hrs or more  Try Dream wear nasal mask.  Work on healthy sleep regimen  Do not drive if sleepy  Work on healthy weight loss  Follow up in 2-3 months and As needed

## 2023-01-13 NOTE — Patient Instructions (Signed)
Begin CPAP At bedtime, wear all night long for at least 6hrs or more  Try Dream wear nasal mask.  Work on healthy sleep regimen  Do not drive if sleepy  Work on healthy weight loss  Follow up in 2-3 months and As needed

## 2023-01-13 NOTE — Progress Notes (Signed)
@Patient  ID: Jerome Sanchez, male    DOB: Mar 29, 1951, 72 y.o.   MRN: [id number]  Chief Complaint  Patient presents with   Follow-up    Referring provider: Tommie Sams, DO  HPI: 72 year old male seen for sleep consult November 03, 2022 for snoring, daytime sleepiness and witnessed apneic events found to have moderate to severe sleep apnea Medical history significant for COPD managed by primary care provider.  Participates in the lung cancer CT screening program.  TEST/EVENTS :  Home sleep study Dec 19, 2022 showed moderate to severe sleep apnea with AHI 29/hour and SpO2 low at 71%.  01/13/2023 Follow up ; OSA  Patient presents for a 71-month follow-up.  Patient seen last visit for sleep consult.  Had snoring and daytime sleepiness with witnessed apneic events.  He was set up for home sleep study that was done on Dec 19, 2022 that showed moderate to severe sleep apnea with AHI 29/hour and SpO2 low at 71%.  We discussed his sleep study results in detail.  Went over treatment options.  Patient like to proceed with CPAP therapy.  Allergies  Allergen Reactions   Penicillins     N&V    Immunization History  Administered Date(s) Administered   Covid-19, Mrna,Vaccine(Spikevax)30yrs and older 07/27/2022   Fluad Quad(high Dose 65+) 05/18/2020, 07/27/2022   Influenza,inj,Quad PF,6+ Mos 07/27/2018, 08/27/2021   Influenza-Unspecified 06/18/2013, 06/08/2015, 06/02/2016, 06/15/2019   Moderna Sars-Covid-2 Vaccination 09/08/2019, 10/14/2019, 07/28/2020, 06/02/2021   PNEUMOCOCCAL CONJUGATE-20 07/27/2022   Pneumococcal Conjugate-13 07/29/2019   Pneumococcal Polysaccharide-23 07/15/2016   RSV,unspecified 07/27/2022    Past Medical History:  Diagnosis Date   Essential hypertension, benign    Impaired glucose tolerance    Injury of right rotator cuff    Lumbar disc disease    Mixed hyperlipidemia     Tobacco History: Social History   Tobacco Use  Smoking Status Former   Packs/day: 1.25    Years: 51.00   Additional pack years: 0.00   Total pack years: 63.75   Types: Cigarettes   Quit date: 2018   Years since quitting: 6.4  Smokeless Tobacco Never   Counseling given: Not Answered   Outpatient Medications Prior to Visit  Medication Sig Dispense Refill   albuterol (VENTOLIN HFA) 108 (90 Base) MCG/ACT inhaler INHALE 1 TO 2 PUFFS BY MOUTH EVERY 6 HOURS AS NEEDED FOR WHEEZING FOR SHORTNESS OF BREATH 9 g 0   atorvastatin (LIPITOR) 80 MG tablet Take 1 tablet (80 mg total) by mouth daily. 100 tablet 1   dapagliflozin propanediol (FARXIGA) 5 MG TABS tablet Take by mouth.     enalapril (VASOTEC) 20 MG tablet TAKE 1 TABLET BY MOUTH TWICE DAILY 200 tablet 1   ezetimibe (ZETIA) 10 MG tablet Take 1 tablet (10 mg total) by mouth daily. 100 tablet 1   hydrochlorothiazide (HYDRODIURIL) 25 MG tablet Take one tablet po daily 100 tablet 1   lansoprazole (PREVACID) 15 MG capsule Take 1 capsule (15 mg total) by mouth as needed. 100 capsule 1   Magnesium 400 MG TABS Take 1 tablet by mouth daily.     naproxen sodium (ANAPROX) 220 MG tablet Take 220 mg by mouth as needed.     OVER THE COUNTER MEDICATION Areds ( omega 3, lutein, zeaxanthin) take one a day     Potassium 99 MG TABS Take by mouth daily.     TRELEGY ELLIPTA 100-62.5-25 MCG/ACT AEPB INHALE 1 PUFF ONCE DAILY 60 each 0   ergocalciferol (VITAMIN D2) 1.25  MG (50000 UT) capsule Take 50,000 Units by mouth once a week.     Facility-Administered Medications Prior to Visit  Medication Dose Route Frequency Provider Last Rate Last Admin   methylPREDNISolone acetate (DEPO-MEDROL) injection 40 mg  40 mg Intra-articular Once Merlyn Albert, MD         Review of Systems:   Constitutional:   No  weight loss, night sweats,  Fevers, chills,  +fatigue, or  lassitude.  HEENT:   No headaches,  Difficulty swallowing,  Tooth/dental problems, or  Sore throat,                No sneezing, itching, ear ache, nasal congestion, post nasal drip,   CV:   No chest pain,  Orthopnea, PND, swelling in lower extremities, anasarca, dizziness, palpitations, syncope.   GI  No heartburn, indigestion, abdominal pain, nausea, vomiting, diarrhea, change in bowel habits, loss of appetite, bloody stools.   Resp: No shortness of breath with exertion or at rest.  No excess mucus, no productive cough,  No non-productive cough,  No coughing up of blood.  No change in color of mucus.  No wheezing.  No chest wall deformity  Skin: no rash or lesions.  GU: no dysuria, change in color of urine, no urgency or frequency.  No flank pain, no hematuria   MS:  No joint pain or swelling.  No decreased range of motion.  No back pain.    Physical Exam  BP 108/70 (BP Location: Left Arm, Patient Position: Sitting, Cuff Size: Large)   Pulse 99   Ht 5\' 10"  (1.778 m)   Wt 231 lb 6.4 oz (105 kg)   SpO2 94%   BMI 33.20 kg/m   GEN: A/Ox3; pleasant , NAD, well nourished    HEENT:  [place]/AT,  , NOSE-clear, THROAT-clear, no lesions, no postnasal drip or exudate noted. Full beard, class 3 MP airway   NECK:  Supple w/ fair ROM; no JVD; normal carotid impulses w/o bruits; no thyromegaly or nodules palpated; no lymphadenopathy.    RESP  Clear  P & A; w/o, wheezes/ rales/ or rhonchi. no accessory muscle use, no dullness to percussion  CARD:  RRR, no m/r/g, no peripheral edema, pulses intact, no cyanosis or clubbing.  GI:   Soft & nt; nml bowel sounds; no organomegaly or masses detected.   Musco: Warm bil, no deformities or joint swelling noted.   Neuro: alert, no focal deficits noted.    Skin: Warm, no lesions or rashes    Lab Results:  CBC     BNP No results found for: "BNP"  ProBNP No results found for: "PROBNP"  Imaging: No results found.        No data to display          No results found for: "NITRICOXIDE"      Assessment & Plan:   OSA (obstructive sleep apnea) Moderate to severe sleep apnea.  Patient education was given on sleep  study results.  We went over treatment options.  Patient will proceed with CPAP therapy will begin auto CPAP 5 to 15 cm H2O.  Will use a DreamWear nasal mask.  - discussed how weight can impact sleep and risk for sleep disordered breathing - discussed options to assist with weight loss: combination of diet modification, cardiovascular and strength training exercises   - had an extensive discussion regarding the adverse health consequences related to untreated sleep disordered breathing - specifically discussed the risks for hypertension, coronary artery  disease, cardiac dysrhythmias, cerebrovascular disease, and diabetes - lifestyle modification discussed   - discussed how sleep disruption can increase risk of accidents, particularly when driving - safe driving practices were discussed   Plan  Patient Instructions  Begin CPAP At bedtime, wear all night long for at least 6hrs or more  Try Dream wear nasal mask.  Work on healthy sleep regimen  Do not drive if sleepy  Work on healthy weight loss  Follow up in 2-3 months and As needed        [employer], NP 01/13/2023

## 2023-01-14 NOTE — Progress Notes (Signed)
Reviewed and agree with assessment/plan.   Mykael Batz, MD [place] [facility] 01/14/2023, 8:11 AM Pager:  [phone]  

## 2023-01-21 ENCOUNTER — Other Ambulatory Visit: Payer: Self-pay | Admitting: Family Medicine

## 2023-01-23 DIAGNOSIS — N281 Cyst of kidney, acquired: Secondary | ICD-10-CM | POA: Diagnosis not present

## 2023-01-23 DIAGNOSIS — E1122 Type 2 diabetes mellitus with diabetic chronic kidney disease: Secondary | ICD-10-CM | POA: Diagnosis not present

## 2023-01-23 DIAGNOSIS — Z79899 Other long term (current) drug therapy: Secondary | ICD-10-CM | POA: Diagnosis not present

## 2023-01-23 DIAGNOSIS — R809 Proteinuria, unspecified: Secondary | ICD-10-CM | POA: Diagnosis not present

## 2023-01-23 DIAGNOSIS — N189 Chronic kidney disease, unspecified: Secondary | ICD-10-CM | POA: Diagnosis not present

## 2023-01-23 DIAGNOSIS — I129 Hypertensive chronic kidney disease with stage 1 through stage 4 chronic kidney disease, or unspecified chronic kidney disease: Secondary | ICD-10-CM | POA: Diagnosis not present

## 2023-01-29 DIAGNOSIS — I499 Cardiac arrhythmia, unspecified: Secondary | ICD-10-CM | POA: Diagnosis not present

## 2023-01-29 DIAGNOSIS — N179 Acute kidney failure, unspecified: Secondary | ICD-10-CM | POA: Diagnosis not present

## 2023-01-29 DIAGNOSIS — G4733 Obstructive sleep apnea (adult) (pediatric): Secondary | ICD-10-CM | POA: Diagnosis not present

## 2023-02-04 ENCOUNTER — Encounter: Payer: Self-pay | Admitting: Internal Medicine

## 2023-02-04 ENCOUNTER — Ambulatory Visit: Payer: PPO | Attending: Internal Medicine | Admitting: Internal Medicine

## 2023-02-04 VITALS — BP 120/70 | HR 84 | Wt 231.2 lb

## 2023-02-04 DIAGNOSIS — I5033 Acute on chronic diastolic (congestive) heart failure: Secondary | ICD-10-CM | POA: Diagnosis not present

## 2023-02-04 DIAGNOSIS — I5030 Unspecified diastolic (congestive) heart failure: Secondary | ICD-10-CM | POA: Insufficient documentation

## 2023-02-04 DIAGNOSIS — I350 Nonrheumatic aortic (valve) stenosis: Secondary | ICD-10-CM | POA: Diagnosis not present

## 2023-02-04 DIAGNOSIS — I491 Atrial premature depolarization: Secondary | ICD-10-CM | POA: Diagnosis not present

## 2023-02-04 DIAGNOSIS — I359 Nonrheumatic aortic valve disorder, unspecified: Secondary | ICD-10-CM

## 2023-02-04 MED ORDER — FUROSEMIDE 40 MG PO TABS: 40.0000 mg | ORAL_TABLET | Freq: Every day | ORAL | 3 refills | Status: DC

## 2023-02-04 NOTE — Progress Notes (Signed)
Cardiology Office Note  Date: 02/04/2023   ID: Jerome Sanchez, DOB [birth year], MRN [id number]  PCP:  Tommie Sams, DO  Cardiologist:  Marjo Bicker, MD Electrophysiologist:  None   Reason for Office Visit: Frequent PAC evaluation   History of Present Illness: Jerome Sanchez is a 72 y.o. male known to have HTN, DM 2, HLD, OSA awaiting CPAP was referred cardiology clinic for evaluation of frequent PACs.  Patient had incidental finding of frequent PACs and PVC on the EKG.  EKG today in the clinic showed frequent PACs and 1 PVC.  He has been asymptomatic, no palpitations, dizziness or syncope.  However when he bends down and tries to lift something up from the ground, he typically feels out of breath.  Also has DOE.  This has been ongoing for the last 3 months.  No orthopnea or PND. No angina.  Past Medical History:  Diagnosis Date   Cardiac arrhythmia    Chronic kidney disease    Essential hypertension, benign    Impaired glucose tolerance    Injury of right rotator cuff    Lumbar disc disease    Mixed hyperlipidemia    OSA (obstructive sleep apnea)    Type 2 diabetes mellitus (HCC)    Vitamin D deficiency     Past Surgical History:  Procedure Laterality Date   BIOPSY  01/19/2020   Procedure: BIOPSY;  Surgeon: Malissa Hippo, MD;  [facility]: [facility];  Service: Endoscopy;;   COLONOSCOPY N/A 01/19/2020   Procedure: COLONOSCOPY;  Surgeon: Malissa Hippo, MD;  [facility]: [facility];  Service: Endoscopy;  Laterality: N/A;   ESOPHAGOGASTRODUODENOSCOPY N/A 01/19/2020   Procedure: ESOPHAGOGASTRODUODENOSCOPY (EGD);  Surgeon: Malissa Hippo, MD;  Location: [facility];  Service: Endoscopy;  Laterality: N/A;  1200   POLYPECTOMY  01/19/2020   Procedure: POLYPECTOMY;  Surgeon: Malissa Hippo, MD;  Location: [facility];  Service: Endoscopy;;    Current Outpatient Medications  Medication Sig Dispense Refill   albuterol (VENTOLIN HFA) 108 (90 Base) MCG/ACT inhaler  INHALE 1 TO 2 PUFFS BY MOUTH EVERY 6 HOURS AS NEEDED FOR WHEEZING FOR SHORTNESS OF BREATH 9 g 0   atorvastatin (LIPITOR) 80 MG tablet Take 1 tablet (80 mg total) by mouth daily. 100 tablet 1   dapagliflozin propanediol (FARXIGA) 5 MG TABS tablet Take 2.5 mg by mouth daily.     enalapril (VASOTEC) 20 MG tablet TAKE 1 TABLET BY MOUTH TWICE DAILY (Patient taking differently: Take 20 mg by mouth daily. TAKE 1 TABLET BY MOUTH DAILY) 200 tablet 1   ezetimibe (ZETIA) 10 MG tablet Take 1 tablet (10 mg total) by mouth daily. 100 tablet 1   Fluticasone-Umeclidin-Vilant (TRELEGY ELLIPTA) 100-62.5-25 MCG/ACT AEPB INHALE 1 PUFF INTO LUNGS ONCE DAILY 60 each 0   furosemide (LASIX) 40 MG tablet Take 1 tablet (40 mg total) by mouth daily. 90 tablet 3   lansoprazole (PREVACID) 15 MG capsule Take 1 capsule (15 mg total) by mouth as needed. 100 capsule 1   Magnesium 400 MG TABS Take 1 tablet by mouth daily.     naproxen sodium (ANAPROX) 220 MG tablet Take 220 mg by mouth as needed.     OVER THE COUNTER MEDICATION Areds ( omega 3, lutein, zeaxanthin) take one a day     Potassium 99 MG TABS Take by mouth daily.     No current facility-administered medications for this visit.   Allergies:  Penicillins   Social  History: The patient  reports that he quit smoking about 6 years ago. His smoking use included cigarettes. He has a 63.75 pack-year smoking history. He has never used smokeless tobacco. He reports current alcohol use. He reports that he does not use drugs.   Family History: The patient's family history includes Diabetes type II in his father; Hypertension in his sister.   ROS:  Please see the history of present illness. Otherwise, complete review of systems is positive for none  All other systems are reviewed and negative.   Physical Exam: VS:  BP 120/70   Pulse 84   Wt 231 lb 3.2 oz (104.9 kg)   SpO2 92%   BMI 33.17 kg/m , BMI Body mass index is 33.17 kg/m.  Wt Readings from Last 3 Encounters:   02/04/23 231 lb 3.2 oz (104.9 kg)  01/13/23 231 lb 6.4 oz (105 kg)  11/25/22 229 lb (103.9 kg)    General: Patient appears comfortable at rest. HEENT: Conjunctiva and lids normal, oropharynx clear with moist mucosa. Neck: Supple, no elevated JVP or carotid bruits, no thyromegaly. Lungs: Clear to auscultation, nonlabored breathing at rest. Cardiac: Regular rate and rhythm, no S3 or significant systolic murmur, no pericardial rub. Abdomen: Soft, nontender, no hepatomegaly, bowel sounds present, no guarding or rebound. Extremities: 1+ pitting edema, distal pulses 2+. Skin: Warm and dry. Musculoskeletal: No kyphosis. Neuropsychiatric: Alert and oriented x3, affect grossly appropriate.  Recent Labwork: 03/05/2022: ALT 27; AST 30; Hemoglobin 15.7; Platelets 264 07/15/2022: BUN 24; Creatinine, Ser 1.31; Potassium 4.6; Sodium 143     Component Value Date/Time   CHOL 183 07/15/2022 0859   TRIG 81 07/15/2022 0859   HDL 94 07/15/2022 0859   CHOLHDL 1.9 07/15/2022 0859   CHOLHDL 3.1 06/27/2014 0724   VLDL 19 06/27/2014 0724   LDLCALC 74 07/15/2022 0859   Assessment and Plan:   # Frequent PACs and PVCs likely secondary to untreated OSA: Asymptomatic, incidental finding on the EKG.  Recently tested positive for OSA.  Will repeat EKG in 6 months after OSA management with CPAP and re-eval symptoms.  # Bendopnea likely secondary to HFpEF: Patient has DOE, bendopnea for the last 3 months associated with 1+ pitting edema in bilateral lower EXTR's.  Switch HCTZ to Lasix 40 mg once daily.  BMP in 5 days.  # Mild to moderate aortic valve stenosis in 2/24: Obtain echocardiogram in 1 year.  # OSA: Awaiting CPAP  # HLD: Continue atorvastatin 80 mg nightly, goal LDL less than 100.   I have spent a total of 45 minutes with patient reviewing chart, EKGs, labs and examining patient as well as establishing an assessment and plan that was discussed with the patient.  > 50% of time was spent in direct  patient care.    Medication Adjustments/Labs and Tests Ordered: Current medicines are reviewed at length with the patient today.  Concerns regarding medicines are outlined above.   Tests Ordered: Orders Placed This Encounter  Procedures   Basic metabolic panel   EKG 12-Lead   ECHOCARDIOGRAM COMPLETE    Medication Changes: Meds ordered this encounter  Medications   furosemide (LASIX) 40 MG tablet    Sig: Take 1 tablet (40 mg total) by mouth daily.    Dispense:  90 tablet    Refill:  3    Disposition:  Follow up  6 months  Signed Zaydon Kinser Verne Spurr, MD, 02/04/2023 12:39 PM    [facility] at [facility] [address]  [address], [place] [address]

## 2023-02-04 NOTE — Patient Instructions (Signed)
Medication Instructions:  Your physician has recommended you make the following change in your medication:   -Stop HCTZ  -Start Lasix 40 mg tablet once daily  *If you need a refill on your cardiac medications before your next appointment, please call your pharmacy*   Lab Work: In 5 Days: -BMET  If you have labs (blood work) drawn today and your tests are completely normal, you will receive your results only by: MyChart Message (if you have MyChart) OR A paper copy in the mail If you have any lab test that is abnormal or we need to change your treatment, we will call you to review the results.   Testing/Procedures: In 1 year:  Your physician has requested that you have an echocardiogram. Echocardiography is a painless test that uses sound waves to create images of your heart. It provides your doctor with information about the size and shape of your heart and how well your heart's chambers and valves are working. This procedure takes approximately one hour. There are no restrictions for this procedure. Please do NOT wear cologne, perfume, aftershave, or lotions (deodorant is allowed). Please arrive 15 minutes prior to your appointment time.    Follow-Up: At [facility], you and your health needs are our priority.  As part of our continuing mission to provide you with exceptional heart care, we have created designated Provider Care Teams.  These Care Teams include your primary Cardiologist (physician) and Advanced Practice Providers (APPs -  Physician Assistants and Nurse Practitioners) who all work together to provide you with the care you need, when you need it.  We recommend signing up for the patient portal called "MyChart".  Sign up information is provided on this After Visit Summary.  MyChart is used to connect with patients for Virtual Visits (Telemedicine).  Patients are able to view lab/test results, encounter notes, upcoming appointments, etc.  Non-urgent messages can  be sent to your provider as well.   To learn more about what you can do with MyChart, go to [email or web address].    Your next appointment:   6 month(s)  Provider:   Luane School, MD    Other Instructions

## 2023-02-06 DIAGNOSIS — G4733 Obstructive sleep apnea (adult) (pediatric): Secondary | ICD-10-CM | POA: Diagnosis not present

## 2023-02-09 DIAGNOSIS — I359 Nonrheumatic aortic valve disorder, unspecified: Secondary | ICD-10-CM | POA: Diagnosis not present

## 2023-02-10 LAB — BASIC METABOLIC PANEL
BUN/Creatinine Ratio: 16 (ref 10–24)
BUN: 22 mg/dL (ref 8–27)
CO2: 23 mmol/L (ref 20–29)
Calcium: 9.2 mg/dL (ref 8.6–10.2)
Chloride: 102 mmol/L (ref 96–106)
Creatinine, Ser: 1.39 mg/dL — ABNORMAL HIGH (ref 0.76–1.27)
Glucose: 93 mg/dL (ref 70–99)
Potassium: 4.4 mmol/L (ref 3.5–5.2)
Sodium: 142 mmol/L (ref 134–144)
eGFR: 54 mL/min/{1.73_m2} — ABNORMAL LOW (ref >59)

## 2023-02-24 ENCOUNTER — Other Ambulatory Visit: Payer: Self-pay | Admitting: Family Medicine

## 2023-02-28 ENCOUNTER — Encounter: Payer: Self-pay | Admitting: Family Medicine

## 2023-03-03 DIAGNOSIS — R809 Proteinuria, unspecified: Secondary | ICD-10-CM | POA: Diagnosis not present

## 2023-03-03 DIAGNOSIS — N179 Acute kidney failure, unspecified: Secondary | ICD-10-CM | POA: Diagnosis not present

## 2023-03-03 DIAGNOSIS — E1129 Type 2 diabetes mellitus with other diabetic kidney complication: Secondary | ICD-10-CM | POA: Diagnosis not present

## 2023-03-03 DIAGNOSIS — N189 Chronic kidney disease, unspecified: Secondary | ICD-10-CM | POA: Diagnosis not present

## 2023-03-03 DIAGNOSIS — E1122 Type 2 diabetes mellitus with diabetic chronic kidney disease: Secondary | ICD-10-CM | POA: Diagnosis not present

## 2023-03-08 DIAGNOSIS — G4733 Obstructive sleep apnea (adult) (pediatric): Secondary | ICD-10-CM | POA: Diagnosis not present

## 2023-03-09 DIAGNOSIS — E870 Hyperosmolality and hypernatremia: Secondary | ICD-10-CM | POA: Diagnosis not present

## 2023-03-09 DIAGNOSIS — E1122 Type 2 diabetes mellitus with diabetic chronic kidney disease: Secondary | ICD-10-CM | POA: Diagnosis not present

## 2023-03-09 DIAGNOSIS — I350 Nonrheumatic aortic (valve) stenosis: Secondary | ICD-10-CM | POA: Diagnosis not present

## 2023-03-10 ENCOUNTER — Ambulatory Visit (INDEPENDENT_AMBULATORY_CARE_PROVIDER_SITE_OTHER): Payer: PPO | Admitting: Family Medicine

## 2023-03-10 VITALS — BP 132/87 | HR 53 | Temp 97.2°F | Ht 70.0 in | Wt 231.2 lb

## 2023-03-10 DIAGNOSIS — I5032 Chronic diastolic (congestive) heart failure: Secondary | ICD-10-CM

## 2023-03-10 DIAGNOSIS — I10 Essential (primary) hypertension: Secondary | ICD-10-CM

## 2023-03-10 DIAGNOSIS — G4733 Obstructive sleep apnea (adult) (pediatric): Secondary | ICD-10-CM

## 2023-03-10 DIAGNOSIS — J449 Chronic obstructive pulmonary disease, unspecified: Secondary | ICD-10-CM

## 2023-03-10 NOTE — Assessment & Plan Note (Signed)
Appears euvolemic on exam today.  Repeating metabolic panel given hypernatremia.

## 2023-03-10 NOTE — Assessment & Plan Note (Signed)
Doing well on CPAP

## 2023-03-10 NOTE — Progress Notes (Signed)
Subjective:  Patient ID: Jerome Sanchez, male    DOB: Apr 30, 1951  Age: 72 y.o. MRN: [id number]  CC:  Follow up  HPI:  72 year old male with the below mentioned medical problems presents for follow-up.  Patient recently seen by cardiology.  Started on Lasix for suspected HFpEF.  Patient reports an improvement in shortness of breath since starting Lasix.  However, he recently had labs from nephrology which revealed a sodium of 148.  Patient concerned about his abnormal lab.  Patient's blood pressure is stable on enalapril and lasix.  Continues on Trelegy regarding COPD.  Recently diagnosed with OSA and is doing fairly well with CPAP.  Patient Active Problem List   Diagnosis Date Noted   PAC (premature atrial contraction) 02/04/2023   Aortic stenosis 02/04/2023   (HFpEF) heart failure with preserved ejection fraction (HCC) 02/04/2023   OSA (obstructive sleep apnea) 01/13/2023   Lesion of right native kidney 09/09/2022   Stage 3a chronic kidney disease (HCC) 07/15/2022   CAD (coronary artery disease) 07/15/2022   Prediabetes 03/04/2022   Former smoker 03/04/2022   Barrett's esophagus 01/26/2020   COPD (chronic obstructive pulmonary disease) (HCC) 07/27/2018   Mixed hyperlipidemia 08/05/2012   Essential hypertension, benign 10/26/2008    Social Hx   Social History   Socioeconomic History   Marital status: Married    Spouse name: Not on file   Number of children: Not on file   Years of education: Not on file   Highest education level: Bachelor's degree (e.g., BA, AB, BS)  Occupational History   Not on file  Tobacco Use   Smoking status: Former    Current packs/day: 0.00    Average packs/day: 1.3 packs/day for 51.0 years (63.8 ttl pk-yrs)    Types: Cigarettes    Start date: 57    Quit date: 2018    Years since quitting: 6.5   Smokeless tobacco: Never  Vaping Use   Vaping status: Never Used  Substance and Sexual Activity   Alcohol use: Yes    Comment: 1-2 glasses  per night   Drug use: No   Sexual activity: Not on file  Other Topics Concern   Not on file  Social History Narrative   Not on file   Social Determinants of Health   Financial Resource Strain: Low Risk  (11/06/2022)   Overall Financial Resource Strain (CARDIA)    Difficulty of Paying Living Expenses: Not very hard  Food Insecurity: No Food Insecurity (11/06/2022)   Hunger Vital Sign    Worried About Running Out of Food in the Last Year: Never true    Ran Out of Food in the Last Year: Never true  Transportation Needs: No Transportation Needs (11/06/2022)   PRAPARE - Administrator, Civil Service (Medical): No    Lack of Transportation (Non-Medical): No  Physical Activity: Insufficiently Active (11/06/2022)   Exercise Vital Sign    Days of Exercise per Week: 3 days    Minutes of Exercise per Session: 20 min  Stress: No Stress Concern Present (11/06/2022)   [employer] of Occupational Health - Occupational Stress Questionnaire    Feeling of Stress : Only a little  Social Connections: Moderately Isolated (11/06/2022)   Social Connection and Isolation Panel [NHANES]    Frequency of Communication with Friends and Family: Three times a week    Frequency of Social Gatherings with Friends and Family: Once a week    Attends Religious Services: Never    Active Member  of Clubs or Organizations: No    Attends Banker Meetings: Never    Marital Status: Married    Review of Systems Per HPI  Objective:  BP 132/87   Pulse (!) 53   Temp (!) 97.2 F (36.2 C)   Ht 5\' 10"  (1.778 m)   Wt 231 lb 3.2 oz (104.9 kg)   SpO2 96%   BMI 33.17 kg/m      03/10/2023   10:21 AM 02/04/2023   10:22 AM 01/13/2023    3:19 PM  BP/Weight  Systolic BP 132 120 108  Diastolic BP 87 70 70  Wt. (Lbs) 231.2 231.2 231.4  BMI 33.17 kg/m2 33.17 kg/m2 33.2 kg/m2    Physical Exam Vitals and nursing note reviewed.  Constitutional:      General: He is not in acute distress.     Appearance: Normal appearance. He is obese.  HENT:     Head: Normocephalic and atraumatic.  Eyes:     General:        Right eye: No discharge.        Left eye: No discharge.     Conjunctiva/sclera: Conjunctivae normal.  Cardiovascular:     Rate and Rhythm: Normal rate.     Heart sounds: Murmur heard.     Comments: Irregular due to ectopy. Pulmonary:     Effort: Pulmonary effort is normal.     Breath sounds: Normal breath sounds. No wheezing, rhonchi or rales.  Neurological:     Mental Status: He is alert.  Psychiatric:        Mood and Affect: Mood normal.        Behavior: Behavior normal.     Lab Results  Component Value Date   WBC 7.1 03/05/2022   HGB 15.7 03/05/2022   HCT 46.0 03/05/2022   PLT 264 03/05/2022   GLUCOSE 93 02/09/2023   CHOL 183 07/15/2022   TRIG 81 07/15/2022   HDL 94 07/15/2022   LDLCALC 74 07/15/2022   ALT 27 03/05/2022   AST 30 03/05/2022   NA 142 02/09/2023   K 4.4 02/09/2023   CL 102 02/09/2023   CREATININE 1.39 (H) 02/09/2023   BUN 22 02/09/2023   CO2 23 02/09/2023   PSA 0.66 06/27/2014   HGBA1C 6.0 (H) 03/05/2022     Assessment & Plan:   Problem List Items Addressed This Visit       Cardiovascular and Mediastinum   (HFpEF) heart failure with preserved ejection fraction (HCC) (Chronic)    Appears euvolemic on exam today.  Repeating metabolic panel given hypernatremia.      Essential hypertension, benign - Primary    Stable.  Continue enalapril and Lasix.  May need dose reduction or discontinuation of Lasix if hypernatremia persists.      Relevant Orders   Basic Metabolic Panel     Respiratory   OSA (obstructive sleep apnea) (Chronic)    Doing well on CPAP.      COPD (chronic obstructive pulmonary disease) (HCC)    Stable.  Continue Trelegy.       Follow-up:  Return in about 3 months (around 06/10/2023).  Everlene Other DO [facility]

## 2023-03-10 NOTE — Patient Instructions (Signed)
Lab today.  Follow up in 3 months.  I will be in touch with results.

## 2023-03-10 NOTE — Assessment & Plan Note (Signed)
Stable. Continue Trelegy.

## 2023-03-10 NOTE — Assessment & Plan Note (Addendum)
Stable.  Continue enalapril and Lasix.  May need dose reduction or discontinuation of Lasix if hypernatremia persists.

## 2023-03-11 LAB — BASIC METABOLIC PANEL
BUN/Creatinine Ratio: 16 (ref 10–24)
BUN: 24 mg/dL (ref 8–27)
CO2: 24 mmol/L (ref 20–29)
Calcium: 9.9 mg/dL (ref 8.6–10.2)
Chloride: 102 mmol/L (ref 96–106)
Creatinine, Ser: 1.46 mg/dL — ABNORMAL HIGH (ref 0.76–1.27)
Glucose: 115 mg/dL — ABNORMAL HIGH (ref 70–99)
Potassium: 5 mmol/L (ref 3.5–5.2)
Sodium: 142 mmol/L (ref 134–144)
eGFR: 50 mL/min/{1.73_m2} — ABNORMAL LOW (ref >59)

## 2023-03-21 ENCOUNTER — Other Ambulatory Visit: Payer: Self-pay | Admitting: Family Medicine

## 2023-03-21 DIAGNOSIS — J441 Chronic obstructive pulmonary disease with (acute) exacerbation: Secondary | ICD-10-CM

## 2023-03-22 ENCOUNTER — Other Ambulatory Visit: Payer: Self-pay | Admitting: Acute Care

## 2023-03-22 DIAGNOSIS — Z87891 Personal history of nicotine dependence: Secondary | ICD-10-CM

## 2023-03-22 DIAGNOSIS — Z122 Encounter for screening for malignant neoplasm of respiratory organs: Secondary | ICD-10-CM

## 2023-03-24 ENCOUNTER — Other Ambulatory Visit: Payer: Self-pay | Admitting: Family Medicine

## 2023-03-25 ENCOUNTER — Encounter: Payer: Self-pay | Admitting: Family Medicine

## 2023-03-25 ENCOUNTER — Other Ambulatory Visit: Payer: Self-pay | Admitting: Family Medicine

## 2023-03-25 MED ORDER — TRELEGY ELLIPTA 100-62.5-25 MCG/ACT IN AEPB: INHALATION_SPRAY | RESPIRATORY_TRACT | 6 refills | Status: DC

## 2023-04-08 DIAGNOSIS — G4733 Obstructive sleep apnea (adult) (pediatric): Secondary | ICD-10-CM | POA: Diagnosis not present

## 2023-05-07 ENCOUNTER — Ambulatory Visit: Payer: PPO | Admitting: Adult Health

## 2023-05-09 DIAGNOSIS — G4733 Obstructive sleep apnea (adult) (pediatric): Secondary | ICD-10-CM | POA: Diagnosis not present

## 2023-05-12 ENCOUNTER — Other Ambulatory Visit: Payer: Self-pay | Admitting: Family Medicine

## 2023-05-12 DIAGNOSIS — I10 Essential (primary) hypertension: Secondary | ICD-10-CM

## 2023-05-13 ENCOUNTER — Other Ambulatory Visit: Payer: Self-pay | Admitting: Pharmacist

## 2023-05-13 ENCOUNTER — Ambulatory Visit: Payer: PPO | Admitting: Family Medicine

## 2023-05-13 VITALS — BP 130/89 | HR 68 | Temp 98.2°F

## 2023-05-13 DIAGNOSIS — Z23 Encounter for immunization: Secondary | ICD-10-CM

## 2023-05-13 DIAGNOSIS — H5711 Ocular pain, right eye: Secondary | ICD-10-CM | POA: Insufficient documentation

## 2023-05-13 MED ORDER — CIPROFLOXACIN HCL 0.3 % OP SOLN: OPHTHALMIC | 0 refills | Status: DC

## 2023-05-13 MED ORDER — KETOROLAC TROMETHAMINE 0.5 % OP SOLN: 1.0000 [drp] | Freq: Four times a day (QID) | OPHTHALMIC | 0 refills | Status: AC

## 2023-05-13 NOTE — Progress Notes (Signed)
Pharmacy Quality Measure Review  This patient is appearing on a report for being at risk of failing the adherence measure for hypertension (ACEi/ARB) medications this calendar year.   Medication: enalapril 20 mg twice daily Last fill date: 05/13/23 for 100 day supply  Called patient and he reports he just picked up enalapril. Insurance report was not up to date. Also reported that his nephrologist decreased the dose to 20 mg once daily, but prescription is still written for twice daily which corresponds with fill hx. Will coordinate with CPP to have prescription updated.  Jarrett Ables, PharmD PGY-1 Pharmacy Resident

## 2023-05-13 NOTE — Patient Instructions (Signed)
Eyedrops as prescribed.  If continues to be problematic, call [facility] on Friday morning.  Take care  Dr. Adriana Simas

## 2023-05-13 NOTE — Assessment & Plan Note (Signed)
Exam unremarkable.  Concern for underlying infection.  Placing on Cipro drops.  Also placing on Toradol eyedrops.  If fails to improve or worsens, he is to see ophthalmology urgently.

## 2023-05-13 NOTE — Progress Notes (Signed)
Subjective:  Patient ID: Jerome Sanchez, male    DOB: [birth year]  Age: 72 y.o. MRN: [id number]  CC: Right eye pain and redness.   HPI:  72 year old male presents for evaluation of the above.  Patient states that approximately 1 week ago he went to take his contact out and realized it was not in place.  He states that he is concerned that he injured his cornea.  He states that his eye has been red and painful.  Associated photophobia.  He has no visual difficulty when he is wearing his glasses.  He has not putting contacts again.  No relieving factors.  Patient Active Problem List   Diagnosis Date Noted   Eye pain, right 05/13/2023   PAC (premature atrial contraction) 02/04/2023   Aortic stenosis 02/04/2023   (HFpEF) heart failure with preserved ejection fraction (HCC) 02/04/2023   OSA (obstructive sleep apnea) 01/13/2023   Lesion of right native kidney 09/09/2022   Stage 3a chronic kidney disease (HCC) 07/15/2022   CAD (coronary artery disease) 07/15/2022   Prediabetes 03/04/2022   Former smoker 03/04/2022   Barrett's esophagus 01/26/2020   COPD (chronic obstructive pulmonary disease) (HCC) 07/27/2018   Mixed hyperlipidemia 08/05/2012   Essential hypertension, benign 10/26/2008    Social Hx   Social History   Socioeconomic History   Marital status: Married    Spouse name: Not on file   Number of children: Not on file   Years of education: Not on file   Highest education level: Bachelor's degree (e.g., BA, AB, BS)  Occupational History   Not on file  Tobacco Use   Smoking status: Former    Current packs/day: 0.00    Average packs/day: 1.3 packs/day for 51.0 years (63.8 ttl pk-yrs)    Types: Cigarettes    Start date: 89    Quit date: 2018    Years since quitting: 6.7   Smokeless tobacco: Never  Vaping Use   Vaping status: Never Used  Substance and Sexual Activity   Alcohol use: Yes    Comment: 1-2 glasses per night   Drug use: No   Sexual activity: Not on  file  Other Topics Concern   Not on file  Social History Narrative   Not on file   Social Determinants of Health   Financial Resource Strain: Low Risk  (11/06/2022)   Overall Financial Resource Strain (CARDIA)    Difficulty of Paying Living Expenses: Not very hard  Food Insecurity: No Food Insecurity (11/06/2022)   Hunger Vital Sign    Worried About Running Out of Food in the Last Year: Never true    Ran Out of Food in the Last Year: Never true  Transportation Needs: No Transportation Needs (11/06/2022)   PRAPARE - Administrator, Civil Service (Medical): No    Lack of Transportation (Non-Medical): No  Physical Activity: Insufficiently Active (11/06/2022)   Exercise Vital Sign    Days of Exercise per Week: 3 days    Minutes of Exercise per Session: 20 min  Stress: No Stress Concern Present (11/06/2022)   [employer] of Occupational Health - Occupational Stress Questionnaire    Feeling of Stress : Only a little  Social Connections: Moderately Isolated (11/06/2022)   Social Connection and Isolation Panel [NHANES]    Frequency of Communication with Friends and Family: Three times a week    Frequency of Social Gatherings with Friends and Family: Once a week    Attends Religious Services: Never  Active Member of Clubs or Organizations: No    Attends Banker Meetings: Never    Marital Status: Married    Review of Systems Per HPI  Objective:  BP 130/89   Pulse 68   Temp 98.2 F (36.8 C) (Oral)   SpO2 97%      05/13/2023   10:51 AM 03/10/2023   10:21 AM 02/04/2023   10:22 AM  BP/Weight  Systolic BP 130 132 120  Diastolic BP 89 87 70  Wt. (Lbs)  231.2 231.2  BMI  33.17 kg/m2 33.17 kg/m2    Physical Exam Constitutional:      General: He is not in acute distress. HENT:     Head: Normocephalic and atraumatic.  Eyes:     Comments: Right eye with mild conjunctival injection.  Negative fluorescein exam. .  Neurological:     Mental Status: He  is alert.     Lab Results  Component Value Date   WBC 7.1 03/05/2022   HGB 15.7 03/05/2022   HCT 46.0 03/05/2022   PLT 264 03/05/2022   GLUCOSE 115 (H) 03/10/2023   CHOL 183 07/15/2022   TRIG 81 07/15/2022   HDL 94 07/15/2022   LDLCALC 74 07/15/2022   ALT 27 03/05/2022   AST 30 03/05/2022   NA 142 03/10/2023   K 5.0 03/10/2023   CL 102 03/10/2023   CREATININE 1.46 (H) 03/10/2023   BUN 24 03/10/2023   CO2 24 03/10/2023   PSA 0.66 06/27/2014   HGBA1C 6.0 (H) 03/05/2022     Assessment & Plan:   Problem List Items Addressed This Visit       Other   Eye pain, right - Primary    Exam unremarkable.  Concern for underlying infection.  Placing on Cipro drops.  Also placing on Toradol eyedrops.  If fails to improve or worsens, he is to see ophthalmology urgently.      Other Visit Diagnoses     Needs flu shot       Relevant Orders   Flu Vaccine Trivalent High Dose (Fluad) (Completed)       Meds ordered this encounter  Medications   ciprofloxacin (CILOXAN) 0.3 % ophthalmic solution    Sig: Administer 1 drop, every 2 hours, while awake, for 2 days. Then 1 drop, every 4 hours, while awake, for the next 5 days.    Dispense:  5 mL    Refill:  0   ketorolac (ACULAR) 0.5 % ophthalmic solution    Sig: Place 1 drop into the right eye 4 (four) times daily for 5 days.    Dispense:  3 mL    Refill:  0    Follow-up:  Return if symptoms worsen or fail to improve.  Everlene Other DO [facility]

## 2023-06-05 DIAGNOSIS — Z13228 Encounter for screening for other metabolic disorders: Secondary | ICD-10-CM | POA: Diagnosis not present

## 2023-06-09 ENCOUNTER — Ambulatory Visit (INDEPENDENT_AMBULATORY_CARE_PROVIDER_SITE_OTHER): Payer: PPO | Admitting: Family Medicine

## 2023-06-09 VITALS — BP 118/76 | HR 52 | Temp 97.3°F | Ht 70.0 in | Wt 231.6 lb

## 2023-06-09 DIAGNOSIS — J449 Chronic obstructive pulmonary disease, unspecified: Secondary | ICD-10-CM

## 2023-06-09 DIAGNOSIS — R7303 Prediabetes: Secondary | ICD-10-CM

## 2023-06-09 DIAGNOSIS — E782 Mixed hyperlipidemia: Secondary | ICD-10-CM

## 2023-06-09 DIAGNOSIS — L308 Other specified dermatitis: Secondary | ICD-10-CM

## 2023-06-09 DIAGNOSIS — I10 Essential (primary) hypertension: Secondary | ICD-10-CM

## 2023-06-09 LAB — MICROALBUMIN / CREATININE URINE RATIO
Albumin, Urine POC: 3
Albumin/Creatinine Ratio, Urine, POC: 87
Creatinine, POC: 6 mg/dL

## 2023-06-09 MED ORDER — HYDROXYZINE PAMOATE 25 MG PO CAPS: 25.0000 mg | ORAL_CAPSULE | Freq: Three times a day (TID) | ORAL | 0 refills | Status: DC | PRN

## 2023-06-09 NOTE — Patient Instructions (Signed)
Labs ordered.  Medication as needed.  Follow up in 6 months.  Call with concerns.  Take care  Dr. Adriana Simas

## 2023-06-10 ENCOUNTER — Ambulatory Visit: Payer: PPO | Admitting: Family Medicine

## 2023-06-11 DIAGNOSIS — L308 Other specified dermatitis: Secondary | ICD-10-CM | POA: Insufficient documentation

## 2023-06-11 NOTE — Assessment & Plan Note (Signed)
Stable.  Continue enalapril.

## 2023-06-11 NOTE — Progress Notes (Signed)
Subjective:  Patient ID: Jerome Sanchez, male    DOB: Dec 06, 1950  Age: 72 y.o. MRN: [id number]  CC:  Follow up   HPI:  72 year old male with the below mentioned medical problems presents for follow-up.  Patient reports that overall he is well a rash on his arms.  It has been quite itchy.  Has not responded to topical hydrocortisone.  No known inciting factor.  Hypertension stable on enalapril.  Lipids have been fairly well-controlled.  Needs recheck lipid panel at follow-up.  He is on atorvastatin and Zetia.  Last LDL was 74.  COPD stable on Trelegy.  Patient Active Problem List   Diagnosis Date Noted   Pruritic dermatitis 06/11/2023   Aortic stenosis 02/04/2023   (HFpEF) heart failure with preserved ejection fraction (HCC) 02/04/2023   OSA (obstructive sleep apnea) 01/13/2023   Lesion of right native kidney 09/09/2022   Stage 3a chronic kidney disease (HCC) 07/15/2022   CAD (coronary artery disease) 07/15/2022   Prediabetes 03/04/2022   Former smoker 03/04/2022   Barrett's esophagus 01/26/2020   COPD (chronic obstructive pulmonary disease) (HCC) 07/27/2018   Mixed hyperlipidemia 08/05/2012   Essential hypertension, benign 10/26/2008    Social Hx   Social History   Socioeconomic History   Marital status: Married    Spouse name: Not on file   Number of children: Not on file   Years of education: Not on file   Highest education level: Bachelor's degree (e.g., BA, AB, BS)  Occupational History   Not on file  Tobacco Use   Smoking status: Former    Current packs/day: 0.00    Average packs/day: 1.3 packs/day for 51.0 years (63.8 ttl pk-yrs)    Types: Cigarettes    Start date: 66    Quit date: 2018    Years since quitting: 6.8   Smokeless tobacco: Never  Vaping Use   Vaping status: Never Used  Substance and Sexual Activity   Alcohol use: Yes    Comment: 1-2 glasses per night   Drug use: No   Sexual activity: Not on file  Other Topics Concern   Not on file   Social History Narrative   Not on file   Social Determinants of Health   Financial Resource Strain: Low Risk  (06/05/2023)   Overall Financial Resource Strain (CARDIA)    Difficulty of Paying Living Expenses: Not hard at all  Food Insecurity: No Food Insecurity (06/05/2023)   Hunger Vital Sign    Worried About Running Out of Food in the Last Year: Never true    Ran Out of Food in the Last Year: Never true  Transportation Needs: No Transportation Needs (06/05/2023)   PRAPARE - Administrator, Civil Service (Medical): No    Lack of Transportation (Non-Medical): No  Physical Activity: Sufficiently Active (06/05/2023)   Exercise Vital Sign    Days of Exercise per Week: 3 days    Minutes of Exercise per Session: 60 min  Stress: No Stress Concern Present (06/05/2023)   [employer] of Occupational Health - Occupational Stress Questionnaire    Feeling of Stress : Only a little  Social Connections: Socially Isolated (06/05/2023)   Social Connection and Isolation Panel [NHANES]    Frequency of Communication with Friends and Family: Once a week    Frequency of Social Gatherings with Friends and Family: Once a week    Attends Religious Services: Never    Database administrator or Organizations: No    Attends  Engineer, structural: Not on file    Marital Status: Married    Review of Systems Per HPI  Objective:  BP 118/76   Pulse (!) 52   Temp (!) 97.3 F (36.3 C)   Ht 5\' 10"  (1.778 m)   Wt 231 lb 9.6 oz (105.1 kg)   SpO2 95%   BMI 33.23 kg/m      06/09/2023    2:37 PM 05/13/2023   10:51 AM 03/10/2023   10:21 AM  BP/Weight  Systolic BP 118 130 132  Diastolic BP 76 89 87  Wt. (Lbs) 231.6  231.2  BMI 33.23 kg/m2  33.17 kg/m2    Physical Exam Constitutional:      General: He is not in acute distress. HENT:     Head: Normocephalic and atraumatic.  Eyes:     General:        Right eye: No discharge.        Left eye: No discharge.      Conjunctiva/sclera: Conjunctivae normal.  Cardiovascular:     Rate and Rhythm: Normal rate and regular rhythm.  Pulmonary:     Effort: Pulmonary effort is normal.     Breath sounds: Normal breath sounds. No wheezing or rales.  Skin:    Comments: Forearms with scattered area with eschar from scratching.  Neurological:     Mental Status: He is alert.     Lab Results  Component Value Date   WBC 7.1 03/05/2022   HGB 15.7 03/05/2022   HCT 46.0 03/05/2022   PLT 264 03/05/2022   GLUCOSE 115 (H) 03/10/2023   CHOL 183 07/15/2022   TRIG 81 07/15/2022   HDL 94 07/15/2022   LDLCALC 74 07/15/2022   ALT 27 03/05/2022   AST 30 03/05/2022   NA 142 03/10/2023   K 5.0 03/10/2023   CL 102 03/10/2023   CREATININE 1.46 (H) 03/10/2023   BUN 24 03/10/2023   CO2 24 03/10/2023   PSA 0.66 06/27/2014   HGBA1C 6.0 (H) 03/05/2022     Assessment & Plan:   Problem List Items Addressed This Visit       Cardiovascular and Mediastinum   Essential hypertension, benign - Primary    Stable.  Continue enalapril.        Respiratory   COPD (chronic obstructive pulmonary disease) (HCC)    Continue Trelegy.  Stable.        Musculoskeletal and Integument   Pruritic dermatitis    Atarax as prescribed.  Referring to dermatology.      Relevant Orders   Ambulatory referral to Dermatology     Other   Mixed hyperlipidemia (Chronic)    Lipid panel ordered.  Continue Zetia and atorvastatin.      Relevant Orders   Lipid panel   Prediabetes   Relevant Orders   Hemoglobin A1c    Meds ordered this encounter  Medications   hydrOXYzine (VISTARIL) 25 MG capsule    Sig: Take 1 capsule (25 mg total) by mouth every 8 (eight) hours as needed for itching.    Dispense:  30 capsule    Refill:  0    Follow-up:  6 months  Sinia Antosh Adriana Simas DO [facility]

## 2023-06-11 NOTE — Assessment & Plan Note (Signed)
Continue Trelegy.  Stable.

## 2023-06-11 NOTE — Assessment & Plan Note (Signed)
Atarax as prescribed.  Referring to dermatology.

## 2023-06-11 NOTE — Assessment & Plan Note (Signed)
Lipid panel ordered.  Continue Zetia and atorvastatin.

## 2023-06-16 ENCOUNTER — Ambulatory Visit: Payer: PPO | Admitting: Nurse Practitioner

## 2023-06-19 ENCOUNTER — Ambulatory Visit: Payer: PPO

## 2023-06-19 ENCOUNTER — Telehealth: Payer: Self-pay

## 2023-06-19 VITALS — Ht 70.0 in | Wt 231.0 lb

## 2023-06-19 DIAGNOSIS — Z0000 Encounter for general adult medical examination without abnormal findings: Secondary | ICD-10-CM | POA: Diagnosis not present

## 2023-06-19 NOTE — Telephone Encounter (Signed)
Patient seen for AWV and states that it was discussed previously that a referral to dermatology could be placed.  He is requesting in the [place] area if possible but open to going to [place].

## 2023-06-19 NOTE — Progress Notes (Signed)
Subjective:   Jerome Sanchez is a 72 y.o. male who presents for Medicare Annual/Subsequent preventive examination.  Visit Complete: Virtual I connected with  Jerome Sanchez on 06/19/23 by a audio enabled telemedicine application and verified that I am speaking with the correct person using two identifiers.  Patient Location: Home  Provider Location: Home Office  I discussed the limitations of evaluation and management by telemedicine. The patient expressed understanding and agreed to proceed.  Vital Signs: Because this visit was a virtual/telehealth visit, some criteria may be missing or patient reported. Any vitals not documented were not able to be obtained and vitals that have been documented are patient reported.  Patient Medicare AWV questionnaire was completed by the patient on 06/15/23; I have confirmed that all information answered by patient is correct and no changes since this date.  Cardiac Risk Factors include: advanced age (>49men, >67 women);male gender;hypertension;dyslipidemia     Objective:    Today's Vitals   06/19/23 1007  Weight: 231 lb (104.8 kg)  Height: 5\' 10"  (1.778 m)   Body mass index is 33.15 kg/m.     06/19/2023   10:13 AM 06/04/2022    8:49 AM 01/19/2020   10:56 AM  Advanced Directives  Does Patient Have a Medical Advance Directive? No No No  Would patient like information on creating a medical advance directive? Yes (MAU/Ambulatory/Procedural Areas - Information given) No - Patient declined No - Patient declined    Current Medications (verified) Outpatient Encounter Medications as of 06/19/2023  Medication Sig   albuterol (VENTOLIN HFA) 108 (90 Base) MCG/ACT inhaler INHALE 1 TO 2 PUFFS BY MOUTH EVERY 6 HOURS AS NEEDED FOR WHEEZING FOR SHORTNESS OF BREATH   atorvastatin (LIPITOR) 80 MG tablet Take 1 tablet (80 mg total) by mouth daily.   dapagliflozin propanediol (FARXIGA) 5 MG TABS tablet Take 2.5 mg by mouth daily.   enalapril (VASOTEC) 20  MG tablet Take 1 tablet by mouth twice daily (Patient taking differently: TAKE 1 TABLET BY MOUTH ONCE DAILY)   ezetimibe (ZETIA) 10 MG tablet Take 1 tablet (10 mg total) by mouth daily.   Fluticasone-Umeclidin-Vilant (TRELEGY ELLIPTA) 100-62.5-25 MCG/ACT AEPB INHALE 1 PUFF INTO LUNGS ONCE DAILY   furosemide (LASIX) 40 MG tablet Take 1 tablet (40 mg total) by mouth daily.   hydrOXYzine (VISTARIL) 25 MG capsule Take 1 capsule (25 mg total) by mouth every 8 (eight) hours as needed for itching.   lansoprazole (PREVACID) 15 MG capsule Take 1 capsule (15 mg total) by mouth as needed.   naproxen sodium (ANAPROX) 220 MG tablet Take 220 mg by mouth as needed.   OVER THE COUNTER MEDICATION Areds ( omega 3, lutein, zeaxanthin) take one a day   [DISCONTINUED] ciprofloxacin (CILOXAN) 0.3 % ophthalmic solution Administer 1 drop, every 2 hours, while awake, for 2 days. Then 1 drop, every 4 hours, while awake, for the next 5 days.   [DISCONTINUED] Magnesium 400 MG TABS Take 1 tablet by mouth daily.   [DISCONTINUED] Potassium 99 MG TABS Take by mouth daily.   No facility-administered encounter medications on file as of 06/19/2023.    Allergies (verified) Penicillins   History: Past Medical History:  Diagnosis Date   Allergy    Arthritis    Asthma    Cardiac arrhythmia    Chronic kidney disease    COPD (chronic obstructive pulmonary disease) (HCC)    Essential hypertension, benign    GERD (gastroesophageal reflux disease)    Impaired glucose tolerance  Injury of right rotator cuff    Lumbar disc disease    Mixed hyperlipidemia    OSA (obstructive sleep apnea)    Sleep apnea    Type 2 diabetes mellitus (HCC)    Vitamin D deficiency    Past Surgical History:  Procedure Laterality Date   BIOPSY  01/19/2020   Procedure: BIOPSY;  Surgeon: Malissa Hippo, MD;  [facility];  Service: Endoscopy;;   COLONOSCOPY N/A 01/19/2020   Procedure: COLONOSCOPY;  Surgeon: Malissa Hippo, MD;   [facility];  Service: Endoscopy;  Laterality: N/A;   ESOPHAGOGASTRODUODENOSCOPY N/A 01/19/2020   Procedure: ESOPHAGOGASTRODUODENOSCOPY (EGD);  Surgeon: Malissa Hippo, MD;  [facility];  Service: Endoscopy;  Laterality: N/A;  1200   POLYPECTOMY  01/19/2020   Procedure: POLYPECTOMY;  Surgeon: Malissa Hippo, MD;  [facility];  Service: Endoscopy;;   Family History  Problem Relation Age of Onset   Diabetes type II Father    Hypertension Sister    Social History   Socioeconomic History   Marital status: Married    Spouse name: Not on file   Number of children: Not on file   Years of education: Not on file   Highest education level: Bachelor's degree (e.g., BA, AB, BS)  Occupational History   Not on file  Tobacco Use   Smoking status: Former    Current packs/day: 0.00    Average packs/day: 1.3 packs/day for 51.0 years (63.8 ttl pk-yrs)    Types: Cigarettes    Start date: 22    Quit date: 2018    Years since quitting: 6.8   Smokeless tobacco: Never  Vaping Use   Vaping status: Never Used  Substance and Sexual Activity   Alcohol use: Yes    Alcohol/week: 6.0 standard drinks of alcohol    Types: 6 Glasses of wine per week    Comment: 1-2 glasses per night   Drug use: No   Sexual activity: Yes    Birth control/protection: None  Other Topics Concern   Not on file  Social History Narrative   Not on file   Social Determinants of Health   Financial Resource Strain: Low Risk  (06/15/2023)   Overall Financial Resource Strain (CARDIA)    Difficulty of Paying Living Expenses: Not hard at all  Food Insecurity: No Food Insecurity (06/15/2023)   Hunger Vital Sign    Worried About Running Out of Food in the Last Year: Never true    Ran Out of Food in the Last Year: Never true  Transportation Needs: No Transportation Needs (06/15/2023)   PRAPARE - Administrator, Civil Service (Medical): No    Lack of Transportation  (Non-Medical): No  Physical Activity: Insufficiently Active (06/15/2023)   Exercise Vital Sign    Days of Exercise per Week: 3 days    Minutes of Exercise per Session: 30 min  Stress: No Stress Concern Present (06/15/2023)   [employer] of Occupational Health - Occupational Stress Questionnaire    Feeling of Stress : Not at all  Social Connections: Moderately Isolated (06/15/2023)   Social Connection and Isolation Panel [NHANES]    Frequency of Communication with Friends and Family: Twice a week    Frequency of Social Gatherings with Friends and Family: Once a week    Attends Religious Services: Never    Database administrator or Organizations: No    Attends Banker Meetings: Never  Marital Status: Married    Tobacco Counseling Counseling given: Not Answered   Clinical Intake:  Pre-visit preparation completed: Yes  Pain : No/denies pain     Diabetes: No  How often do you need to have someone help you when you read instructions, pamphlets, or other written materials from your doctor or pharmacy?: 1 - Never  Interpreter Needed?: No  Information entered by :: Kandis Fantasia LPN   Activities of Daily Living    06/15/2023   10:49 AM  In your present state of health, do you have any difficulty performing the following activities:  Hearing? 0  Vision? 0  Difficulty concentrating or making decisions? 0  Walking or climbing stairs? 0  Dressing or bathing? 0  Doing errands, shopping? 0  Preparing Food and eating ? N  Using the Toilet? N  In the past six months, have you accidently leaked urine? Y  Do you have problems with loss of bowel control? N  Managing your Medications? N  Managing your Finances? N  Housekeeping or managing your Housekeeping? N    Patient Care Team: Tommie Sams, DO as PCP - General (Family Medicine) Mallipeddi, Orion Modest, MD as PCP - Cardiology (Cardiology) Jonelle Sidle, MD (Cardiology)  Indicate any recent  Medical Services you may have received from other than [facility] providers in the past year (date may be approximate).     Assessment:   This is a routine wellness examination for Makana.  Hearing/Vision screen Hearing Screening - Comments:: Denies hearing difficulties    Vision Screening - Comments:: up to date with routine eye exams with Dr. Galen Manila     Goals Addressed             This Visit's Progress    Remain active and independent        Depression Screen    06/19/2023   10:11 AM 06/09/2023    2:43 PM 05/13/2023   10:48 AM 03/10/2023   10:33 AM 11/25/2022    9:30 AM 11/06/2022    4:38 PM 09/09/2022    8:13 AM  PHQ 2/9 Scores  PHQ - 2 Score 0 0 0 0 0 0 0  PHQ- 9 Score 0 0  0 1 1 0    Fall Risk    06/15/2023   10:49 AM 06/09/2023    2:43 PM 05/13/2023   10:48 AM 03/10/2023   10:33 AM 11/25/2022    9:30 AM  Fall Risk   Falls in the past year? 0 0 0 0 0  Number falls in past yr: 0  0  0  Injury with Fall? 0  0  0  Risk for fall due to : History of fall(s)    No Fall Risks  Follow up Falls evaluation completed;Education provided;Falls prevention discussed    Falls evaluation completed    MEDICARE RISK AT HOME: Medicare Risk at Home Any stairs in or around the home?: Yes If so, are there any without handrails?: No Home free of loose throw rugs in walkways, pet beds, electrical cords, etc?: Yes Adequate lighting in your home to reduce risk of falls?: Yes Life alert?: No Use of a cane, walker or w/c?: No Grab bars in the bathroom?: No Shower chair or bench in shower?: No Elevated toilet seat or a handicapped toilet?: Yes  TIMED UP AND Sanchez:  Was the test performed?  No    Cognitive Function:        06/19/2023   10:18 AM  06/04/2022    8:51 AM  6CIT Screen  What Year? 0 points 0 points  What month? 0 points 0 points  What time? 0 points 0 points  Count back from 20 0 points 0 points  Months in reverse 0 points 0 points  Repeat phrase 0 points 0  points  Total Score 0 points 0 points    Immunizations Immunization History  Administered Date(s) Administered   Fluad Quad(high Dose 65+) 05/18/2020, 07/27/2022   Fluad Trivalent(High Dose 65+) 05/13/2023   Influenza,inj,Quad PF,6+ Mos 07/27/2018, 08/27/2021   Influenza-Unspecified 06/18/2013, 06/08/2015, 06/02/2016, 06/15/2019   Moderna Covid-19 Fall Seasonal Vaccine 54yrs & older 07/27/2022   Moderna Sars-Covid-2 Vaccination 09/08/2019, 10/14/2019, 07/28/2020, 06/02/2021   PNEUMOCOCCAL CONJUGATE-20 07/27/2022   Pneumococcal Conjugate-13 07/29/2019   Pneumococcal Polysaccharide-23 07/15/2016   RSV,unspecified 07/27/2022    TDAP status: Due, Education has been provided regarding the importance of this vaccine. Advised may receive this vaccine at local pharmacy or Health Dept. Aware to provide a copy of the vaccination record if obtained from local pharmacy or Health Dept. Verbalized acceptance and understanding.  Flu Vaccine status: Up to date  Pneumococcal vaccine status: Up to date  Covid-19 vaccine status: Information provided on how to obtain vaccines.   Qualifies for Shingles Vaccine? No   Zostavax completed No   Shingrix Completed?: No.    Education has been provided regarding the importance of this vaccine. Patient has been advised to call insurance company to determine out of pocket expense if they have not yet received this vaccine. Advised may also receive vaccine at local pharmacy or Health Dept. Verbalized acceptance and understanding.  Screening Tests Health Maintenance  Topic Date Due   DTaP/Tdap/Td (1 - Tdap) Never done   Zoster Vaccines- Shingrix (1 of 2) Never done   Lung Cancer Screening  07/03/2023   Medicare Annual Wellness (AWV)  06/18/2024   Colonoscopy  01/19/2027   Pneumonia Vaccine 99+ Years old  Completed   INFLUENZA VACCINE  Completed   HPV VACCINES  Aged Out   COVID-19 Vaccine  Discontinued   Hepatitis C Screening  Discontinued    Health  Maintenance  Health Maintenance Due  Topic Date Due   DTaP/Tdap/Td (1 - Tdap) Never done   Zoster Vaccines- Shingrix (1 of 2) Never done   Lung Cancer Screening  07/03/2023    Colorectal cancer screening: Type of screening: Colonoscopy. Completed 01/19/20. Repeat every 7 years  Lung Cancer Screening: (Low Dose CT Chest recommended if Age 96-80 years, 20 pack-year currently smoking OR have quit w/in 15years.) does qualify.   Lung Cancer Screening Referral: scheduled for 07/06/23  Additional Screening:  Hepatitis C Screening: does qualify  Vision Screening: Recommended annual ophthalmology exams for early detection of glaucoma and other disorders of the eye. Is the patient up to date with their annual eye exam?  Yes  Who is the provider or what is the name of the office in which the patient attends annual eye exams? Dr. Galen Manila  If pt is not established with a provider, would they like to be referred to a provider to establish care? No .   Dental Screening: Recommended annual dental exams for proper oral hygiene  Community Resource Referral / Chronic Care Management: CRR required this visit?  No   CCM required this visit?  No     Plan:     I have personally reviewed and noted the following in the patient's chart:   Medical and social history Use of  alcohol, tobacco or illicit drugs  Current medications and supplements including opioid prescriptions. Patient is not currently taking opioid prescriptions. Functional ability and status Nutritional status Physical activity Advanced directives List of other physicians Hospitalizations, surgeries, and ER visits in previous 12 months Vitals Screenings to include cognitive, depression, and falls Referrals and appointments  In addition, I have reviewed and discussed with patient certain preventive protocols, quality metrics, and best practice recommendations. A written personalized care plan for preventive services as  well as general preventive health recommendations were provided to patient.     Kandis Fantasia [place], [place]   16/08/958   After Visit Summary: (MyChart) Due to this being a telephonic visit, the after visit summary with patients personalized plan was offered to patient via MyChart   Nurse Notes: No concerns at this time

## 2023-06-19 NOTE — Patient Instructions (Signed)
Mr. Schollmeyer , Thank you for taking time to come for your Medicare Wellness Visit. I appreciate your ongoing commitment to your health goals. Please review the following plan we discussed and let me know if I can assist you in the future.   Referrals/Orders/Follow-Ups/Clinician Recommendations: Aim for 30 minutes of exercise or brisk walking, 6-8 glasses of water, and 5 servings of fruits and vegetables each day.  This is a list of the screening recommended for you and due dates:  Health Maintenance  Topic Date Due   DTaP/Tdap/Td vaccine (1 - Tdap) Never done   Zoster (Shingles) Vaccine (1 of 2) Never done   Medicare Annual Wellness Visit  06/05/2023   Screening for Lung Cancer  07/03/2023   Colon Cancer Screening  01/19/2027   Pneumonia Vaccine  Completed   Flu Shot  Completed   HPV Vaccine  Aged Out   COVID-19 Vaccine  Discontinued   Hepatitis C Screening  Discontinued    Advanced directives: (ACP Link)Information on Advanced Care Planning can be found at [facility] [place] Advance Health Care Directives Advance Health Care Directives ([email or web address]   Next Medicare Annual Wellness Visit scheduled for next year: Yes

## 2023-06-22 ENCOUNTER — Encounter: Payer: Self-pay | Admitting: Family Medicine

## 2023-06-22 DIAGNOSIS — E782 Mixed hyperlipidemia: Secondary | ICD-10-CM | POA: Diagnosis not present

## 2023-06-22 DIAGNOSIS — E1122 Type 2 diabetes mellitus with diabetic chronic kidney disease: Secondary | ICD-10-CM | POA: Diagnosis not present

## 2023-06-22 DIAGNOSIS — N189 Chronic kidney disease, unspecified: Secondary | ICD-10-CM | POA: Diagnosis not present

## 2023-06-22 DIAGNOSIS — R809 Proteinuria, unspecified: Secondary | ICD-10-CM | POA: Diagnosis not present

## 2023-06-22 DIAGNOSIS — N1831 Chronic kidney disease, stage 3a: Secondary | ICD-10-CM | POA: Diagnosis not present

## 2023-06-22 DIAGNOSIS — R7303 Prediabetes: Secondary | ICD-10-CM | POA: Diagnosis not present

## 2023-06-22 DIAGNOSIS — E870 Hyperosmolality and hypernatremia: Secondary | ICD-10-CM | POA: Diagnosis not present

## 2023-06-23 LAB — HEMOGLOBIN A1C
Est. average glucose Bld gHb Est-mCnc: 143 mg/dL
Hgb A1c MFr Bld: 6.6 % — ABNORMAL HIGH (ref 4.8–5.6)

## 2023-06-23 LAB — LIPID PANEL
Chol/HDL Ratio: 2 ratio (ref 0.0–5.0)
Cholesterol, Total: 158 mg/dL (ref 100–199)
HDL: 79 mg/dL (ref >39)
LDL Chol Calc (NIH): 67 mg/dL (ref 0–99)
Triglycerides: 62 mg/dL (ref 0–149)
VLDL Cholesterol Cal: 12 mg/dL (ref 5–40)

## 2023-06-26 ENCOUNTER — Telehealth: Payer: Self-pay | Admitting: Family Medicine

## 2023-06-26 ENCOUNTER — Telehealth: Payer: Self-pay | Admitting: Pharmacist

## 2023-06-26 DIAGNOSIS — I10 Essential (primary) hypertension: Secondary | ICD-10-CM

## 2023-06-26 MED ORDER — ENALAPRIL MALEATE 20 MG PO TABS: ORAL_TABLET | ORAL | 0 refills | Status: DC

## 2023-06-26 NOTE — Telephone Encounter (Signed)
Copied from CRM [id number]. Topic: General - Call Back - No Documentation >> Jun 26, 2023  4:28 PM Mosetta Putt H wrote:  Reason for CRM: got a call from nurse to call back

## 2023-06-26 NOTE — Telephone Encounter (Signed)
Pharmacy Quality Measure Review   This patient is appearing on a report for being at risk of failing the adherence measure for hypertension (ACEi/ARB) medications this calendar year.   Medication: enalapril 20 mg twice daily Last fill date: 05/13/23 for 100 day supply   Nephrologist decreased enalapril to 20 mg once daily.  New RX sent to PCP for cosign   Kieth Brightly, PharmD, BCACP, CPP Clinical Pharmacist, [facility]

## 2023-06-27 ENCOUNTER — Other Ambulatory Visit: Payer: Self-pay | Admitting: Family Medicine

## 2023-06-27 DIAGNOSIS — E782 Mixed hyperlipidemia: Secondary | ICD-10-CM

## 2023-07-01 ENCOUNTER — Other Ambulatory Visit (HOSPITAL_COMMUNITY): Payer: Self-pay | Admitting: Nephrology

## 2023-07-01 DIAGNOSIS — R809 Proteinuria, unspecified: Secondary | ICD-10-CM | POA: Diagnosis not present

## 2023-07-01 DIAGNOSIS — N1831 Chronic kidney disease, stage 3a: Secondary | ICD-10-CM

## 2023-07-01 DIAGNOSIS — N281 Cyst of kidney, acquired: Secondary | ICD-10-CM | POA: Diagnosis not present

## 2023-07-01 DIAGNOSIS — I129 Hypertensive chronic kidney disease with stage 1 through stage 4 chronic kidney disease, or unspecified chronic kidney disease: Secondary | ICD-10-CM | POA: Diagnosis not present

## 2023-07-01 DIAGNOSIS — E1122 Type 2 diabetes mellitus with diabetic chronic kidney disease: Secondary | ICD-10-CM | POA: Diagnosis not present

## 2023-07-06 ENCOUNTER — Ambulatory Visit (HOSPITAL_COMMUNITY)
Admission: RE | Admit: 2023-07-06 | Discharge: 2023-07-06 | Disposition: A | Discharge status: Home / Self Care | Payer: PPO | Source: Ambulatory Visit | Attending: Internal Medicine | Admitting: Internal Medicine

## 2023-07-06 DIAGNOSIS — Z122 Encounter for screening for malignant neoplasm of respiratory organs: Secondary | ICD-10-CM | POA: Diagnosis not present

## 2023-07-06 DIAGNOSIS — Z87891 Personal history of nicotine dependence: Secondary | ICD-10-CM | POA: Diagnosis not present

## 2023-07-07 ENCOUNTER — Telehealth: Payer: Self-pay

## 2023-07-07 NOTE — Telephone Encounter (Signed)
Pt needs blood work ordered for 3 month follow up made in Feb to check A1C

## 2023-07-08 DIAGNOSIS — E139 Other specified diabetes mellitus without complications: Secondary | ICD-10-CM | POA: Insufficient documentation

## 2023-07-08 NOTE — Telephone Encounter (Signed)
Cook, Jayce G, DO  You1 minute ago (9:13 AM)    Please order CBC, CMP, and A1C.

## 2023-07-09 DIAGNOSIS — G4733 Obstructive sleep apnea (adult) (pediatric): Secondary | ICD-10-CM | POA: Diagnosis not present

## 2023-07-09 NOTE — Telephone Encounter (Signed)
Jerome Sams, DO     Alright. Just A1C (he's is going to do it in Feb). Just advise him about the date (must be 3 months from prior A1C)

## 2023-07-15 ENCOUNTER — Other Ambulatory Visit: Payer: Self-pay

## 2023-07-15 DIAGNOSIS — R7303 Prediabetes: Secondary | ICD-10-CM

## 2023-07-15 NOTE — Telephone Encounter (Signed)
Called pt and informed him that he needs to have A1C checked on or after 09/22/2023 for 3 month follow up, Pt verbalized understanding

## 2023-07-28 ENCOUNTER — Other Ambulatory Visit: Payer: Self-pay

## 2023-07-28 DIAGNOSIS — Z122 Encounter for screening for malignant neoplasm of respiratory organs: Secondary | ICD-10-CM

## 2023-07-28 DIAGNOSIS — Z87891 Personal history of nicotine dependence: Secondary | ICD-10-CM

## 2023-08-04 ENCOUNTER — Encounter: Payer: Self-pay | Admitting: Internal Medicine

## 2023-08-04 ENCOUNTER — Ambulatory Visit: Payer: PPO | Attending: Internal Medicine | Admitting: Internal Medicine

## 2023-08-04 VITALS — BP 138/88 | HR 68 | Ht 69.0 in | Wt 232.0 lb

## 2023-08-04 DIAGNOSIS — I491 Atrial premature depolarization: Secondary | ICD-10-CM | POA: Diagnosis not present

## 2023-08-04 NOTE — Patient Instructions (Signed)

## 2023-08-04 NOTE — Progress Notes (Signed)
Cardiology Office Note  Date: 08/04/2023   ID: Jerome Sanchez, DOB 06/08/1951, MRN [id number]  PCP:  Tommie Sams, DO  Cardiologist:  Marjo Bicker, MD Electrophysiologist:  None    History of Present Illness: Jerome Sanchez is a 72 y.o. male known to have HTN, DM 2, HLD, OSA on CPAP is here for follow-up visit.  Prior EKG showed occasional PVC and repeat EKGs showed normal sinus rhythm, RBBB and no evidence of PVC or PAC.  He got CPAP recently for OSA and noticed a lot of difference after using CPAP.  He denies any symptoms of palpitations, dizziness, syncope, angina, DOE.  No orthopnea PND or leg swelling.  He was started on Farxiga 5 mg once daily by his nephrologist.  He continues to have some shortness of breath when he bends over.  Past Medical History:  Diagnosis Date   Allergy    Arthritis    Asthma    Cardiac arrhythmia    Chronic kidney disease    COPD (chronic obstructive pulmonary disease) (HCC)    Essential hypertension, benign    GERD (gastroesophageal reflux disease)    Impaired glucose tolerance    Injury of right rotator cuff    Lumbar disc disease    Mixed hyperlipidemia    OSA (obstructive sleep apnea)    Sleep apnea    Type 2 diabetes mellitus (HCC)    Vitamin D deficiency     Past Surgical History:  Procedure Laterality Date   BIOPSY  01/19/2020   Procedure: BIOPSY;  Surgeon: Malissa Hippo, MD;  [facility];  Service: Endoscopy;;   COLONOSCOPY N/A 01/19/2020   Procedure: COLONOSCOPY;  Surgeon: Malissa Hippo, MD;  [facility];  Service: Endoscopy;  Laterality: N/A;   ESOPHAGOGASTRODUODENOSCOPY N/A 01/19/2020   Procedure: ESOPHAGOGASTRODUODENOSCOPY (EGD);  Surgeon: Malissa Hippo, MD;  [facility];  Service: Endoscopy;  Laterality: N/A;  1200   POLYPECTOMY  01/19/2020   Procedure: POLYPECTOMY;  Surgeon: Malissa Hippo, MD;  [facility]: [facility];  Service: Endoscopy;;    Current Outpatient  Medications  Medication Sig Dispense Refill   albuterol (VENTOLIN HFA) 108 (90 Base) MCG/ACT inhaler INHALE 1 TO 2 PUFFS BY MOUTH EVERY 6 HOURS AS NEEDED FOR WHEEZING FOR SHORTNESS OF BREATH 9 g 0   atorvastatin (LIPITOR) 80 MG tablet Take 1 tablet by mouth once daily 100 tablet 0   dapagliflozin propanediol (FARXIGA) 5 MG TABS tablet Take 5 mg by mouth daily.     enalapril (VASOTEC) 20 MG tablet TAKE 1 TABLET BY MOUTH ONCE DAILY 90 tablet 0   ezetimibe (ZETIA) 10 MG tablet Take 1 tablet (10 mg total) by mouth daily. 100 tablet 1   Fluticasone-Umeclidin-Vilant (TRELEGY ELLIPTA) 100-62.5-25 MCG/ACT AEPB INHALE 1 PUFF INTO LUNGS ONCE DAILY 180 each 6   furosemide (LASIX) 40 MG tablet Take 1 tablet (40 mg total) by mouth daily. 90 tablet 3   hydrOXYzine (VISTARIL) 25 MG capsule Take 1 capsule (25 mg total) by mouth every 8 (eight) hours as needed for itching. 30 capsule 0   lansoprazole (PREVACID) 15 MG capsule Take 1 capsule (15 mg total) by mouth as needed. 100 capsule 1   naproxen sodium (ANAPROX) 220 MG tablet Take 220 mg by mouth as needed.     OVER THE COUNTER MEDICATION Areds ( omega 3, lutein, zeaxanthin) take one a day     No current facility-administered medications for this visit.  Allergies:  Penicillins   Social History: The patient  reports that he quit smoking about 6 years ago. His smoking use included cigarettes. He started smoking about 58 years ago. He has a 63.8 pack-year smoking history. He has never used smokeless tobacco. He reports current alcohol use of about 6.0 standard drinks of alcohol per week. He reports that he does not use drugs.   Family History: The patient's family history includes Diabetes type II in his father; Hypertension in his sister.   ROS:  Please see the history of present illness. Otherwise, complete review of systems is positive for none  All other systems are reviewed and negative.   Physical Exam: VS:  BP 138/88   Pulse 68   Ht 5\' 9"  (1.753 m)    Wt 232 lb (105.2 kg)   SpO2 95%   BMI 34.26 kg/m , BMI Body mass index is 34.26 kg/m.  Wt Readings from Last 3 Encounters:  08/04/23 232 lb (105.2 kg)  06/19/23 231 lb (104.8 kg)  06/09/23 231 lb 9.6 oz (105.1 kg)    General: Patient appears comfortable at rest. HEENT: Conjunctiva and lids normal, oropharynx clear with moist mucosa. Neck: Supple, no elevated JVP or carotid bruits, no thyromegaly. Lungs: Clear to auscultation, nonlabored breathing at rest. Cardiac: Regular rate and rhythm, no S3 or significant systolic murmur, no pericardial rub. Abdomen: Soft, nontender, no hepatomegaly, bowel sounds present, no guarding or rebound. Extremities: 1+ pitting edema, distal pulses 2+. Skin: Warm and dry. Musculoskeletal: No kyphosis. Neuropsychiatric: Alert and oriented x3, affect grossly appropriate.  Recent Labwork: 03/10/2023: BUN 24; Creatinine, Ser 1.46; Potassium 5.0; Sodium 142     Component Value Date/Time   CHOL 158 06/22/2023 0908   TRIG 62 06/22/2023 0908   HDL 79 06/22/2023 0908   CHOLHDL 2.0 06/22/2023 0908   CHOLHDL 3.1 06/27/2014 0724   VLDL 19 06/27/2014 0724   LDLCALC 67 06/22/2023 0908   Assessment and Plan:   Paroxysmal PVC/PAC, asymptomatic Chronic diastolic heart failure Mild to moderate aortic valve stenosis in 2024 OSA on CPAP HLD, goal   -EKG today showed normal sinus rhythm and RBBB, denies having any palpitations, dizziness or syncope.  Will defer event monitor for now.  Compensated, denies having DOE, orthopnea, PND or leg swelling.  Continue p.o. Lasix 40 mg once daily and Farxiga 5 mg once daily.  Dose will need to be increased to 10 mg once daily for heart failure benefit.  Farxiga managed by nephrology.  He continues to have bendopnea and instructed patient not to bend over.  He noticed a lot of positive changes after starting to use CPAP.  Congratulated for compliance with CPAP.  Echocardiogram in 2024 showed mild to moderate aortic valve  stenosis, he has repeat echocardiogram scheduled in June 2025.  Otherwise, continue atorvastatin 80 mg nightly, goal LDL is 100.  Currently at goal.   Medication Adjustments/Labs and Tests Ordered: Current medicines are reviewed at length with the patient today.  Concerns regarding medicines are outlined above.   Tests Ordered: Orders Placed This Encounter  Procedures   EKG 12-Lead    Medication Changes: No orders of the defined types were placed in this encounter.   Disposition:  Follow up  1 year  Signed Abisola Carrero Verne Spurr, MD, 08/04/2023 11:40 AM    [facility] [address] [place], [place], [place] [address]

## 2023-08-08 DIAGNOSIS — G4733 Obstructive sleep apnea (adult) (pediatric): Secondary | ICD-10-CM | POA: Diagnosis not present

## 2023-08-24 DIAGNOSIS — D225 Melanocytic nevi of trunk: Secondary | ICD-10-CM | POA: Diagnosis not present

## 2023-08-24 DIAGNOSIS — X32XXXA Exposure to sunlight, initial encounter: Secondary | ICD-10-CM | POA: Diagnosis not present

## 2023-08-24 DIAGNOSIS — L570 Actinic keratosis: Secondary | ICD-10-CM | POA: Diagnosis not present

## 2023-08-24 DIAGNOSIS — C44519 Basal cell carcinoma of skin of other part of trunk: Secondary | ICD-10-CM | POA: Diagnosis not present

## 2023-09-07 ENCOUNTER — Other Ambulatory Visit (HOSPITAL_COMMUNITY): Payer: Self-pay | Admitting: Nephrology

## 2023-09-07 DIAGNOSIS — E1122 Type 2 diabetes mellitus with diabetic chronic kidney disease: Secondary | ICD-10-CM

## 2023-09-07 DIAGNOSIS — N281 Cyst of kidney, acquired: Secondary | ICD-10-CM

## 2023-09-07 DIAGNOSIS — N189 Chronic kidney disease, unspecified: Secondary | ICD-10-CM

## 2023-09-08 DIAGNOSIS — G4733 Obstructive sleep apnea (adult) (pediatric): Secondary | ICD-10-CM | POA: Diagnosis not present

## 2023-09-10 ENCOUNTER — Ambulatory Visit: Payer: PPO | Admitting: Adult Health

## 2023-09-10 ENCOUNTER — Encounter: Payer: Self-pay | Admitting: Adult Health

## 2023-09-10 VITALS — BP 131/73 | HR 73 | Ht 69.0 in | Wt 233.2 lb

## 2023-09-10 DIAGNOSIS — J449 Chronic obstructive pulmonary disease, unspecified: Secondary | ICD-10-CM | POA: Diagnosis not present

## 2023-09-10 DIAGNOSIS — G4733 Obstructive sleep apnea (adult) (pediatric): Secondary | ICD-10-CM | POA: Diagnosis not present

## 2023-09-10 NOTE — Assessment & Plan Note (Signed)
Longstanding history of COPD with emphysema with heavy smoking history.  Patient continues to have significant symptom burden on triple therapy maintenance regimen.  He participates in the lung cancer CT screening program. For now would check pulmonary function testing for possible progressive decline.  Would benefit from pulmonary rehab.  Would also possibly be a candidate for Ohtuvayre nebs.  Will discuss on follow-up visit and review PFTs.  If cough continues could consider changing ACE inhibitor as it may be aggravating the cough but most likely not the underlying culprit for his cough.  Plan  Patient Instructions  Continue on CPAP At bedtime, wear all night long for at least 6hrs or more  Work on healthy sleep regimen  Do not drive if sleepy  Work on healthy weight loss   Set up for PFT. [facility])  Continue on Trelegy 1 puff  daily  Albuterol inhaler As needed   Mucinex DM As needed  cough/congestion  Can discuss with Dr. Adriana Simas that Vasotec may be aggravating your cough.  Follow up in 1 month to go over results  and As needed      o

## 2023-09-10 NOTE — Patient Instructions (Addendum)
Continue on CPAP At bedtime, wear all night long for at least 6hrs or more  Work on healthy sleep regimen  Do not drive if sleepy  Work on healthy weight loss   Set up for PFT. [facility])  Continue on Trelegy 1 puff  daily  Albuterol inhaler As needed   Mucinex DM As needed  cough/congestion  Can discuss with Dr. Adriana Simas that Vasotec may be aggravating your cough.  Follow up in 1 month to go over results  and As needed

## 2023-09-10 NOTE — Assessment & Plan Note (Signed)
Moderate to severe obstructive sleep apnea with excellent control compliance on nocturnal CPAP.  Patient has perceived benefit on CPAP.  Continue on current settings.  CPAP care discussed in detail.  Plan Patient Instructions  Continue on CPAP At bedtime, wear all night long for at least 6hrs or more  Work on healthy sleep regimen  Do not drive if sleepy  Work on healthy weight loss   Set up for PFT. [facility])  Continue on Trelegy 1 puff  daily  Albuterol inhaler As needed   Mucinex DM As needed  cough/congestion  Can discuss with Dr. Adriana Simas that Vasotec may be aggravating your cough.  Follow up in 1 month to go over results  and As needed     '

## 2023-09-10 NOTE — Progress Notes (Signed)
@Patient  ID: Jerome Sanchez, male    DOB: Mar 08, 1951, 73 y.o.   MRN: [id number]  Chief Complaint  Patient presents with   Follow-up    Referring provider: Tommie Sams, DO  HPI: 73 year old male former smoker seen for sleep consult November 03, 2022 for snoring and daytime sleepiness found to have moderate to severe sleep apnea Medical history significant for COPD managed by primary care.  Participates in the lung cancer CT chest screening program  TEST/EVENTS :  Home sleep study Dec 19, 2022 showed moderate to severe sleep apnea with AHI 29/hour and SpO2 low at 71%.   09/10/2023 Follow up ; OSA Patient presents for a 18-month follow-up.  Patient has moderate to severe sleep apnea.  Last visit we reviewed his sleep study results that were done in May.  He was started on CPAP therapy.  Patient says he is doing well on CPAP.  Feels that he is benefiting from CPAP with decreased daytime sleepiness.  CPAP download shows excellent compliance with 100% usage.  Daily average usage at 8 hours.  Patient is on auto CPAP 5 to 15 cm H2O.  Daily average pressure at 10.7 cm H2O.  AHI 0.8/hour. Using dream wear nasal. Likes this mask best. Used chin strap when he first started using. Now does not need to use chin strap.   Went on trip to [place]. Took his CPAP and worked very well.   DME : [employer].   Has COPD with Emphysema.  Patient is a former smoker quit smoking in 2018.  Has a heavy smoking history with a 63-pack-year history.  He is followed by primary care.  He participates in the lung cancer CT screening program.  CT chest July 06, 2023 showed a lung RADS 2 benign appearance with moderate emphysema and diffuse bronchial wall thickening.  Stable pulmonary nodules. Patient complains that he has a daily ongoing cough that is intermittently productive with clear to white mucus.  Gets short of breath with activities.  Does feel that his activity tolerance has decreased over the last couple  years.  He has no hemoptysis or unintentional weight loss.  He was started on Trelegy inhaler in August 2024.  Unsure if it has made a whole lot of difference with his breathing.  PFTs 2014 reports showed moderate airflow obstruction. No recent hospitalizations.  No recent steroid use. Uses his albuterol most days. Patient is on ACE inhibitor  Allergies  Allergen Reactions   Penicillins     N&V    Immunization History  Administered Date(s) Administered   Fluad Quad(high Dose 65+) 05/18/2020, 07/27/2022   Fluad Trivalent(High Dose 65+) 05/13/2023   Influenza,inj,Quad PF,6+ Mos 07/27/2018, 08/27/2021   Influenza-Unspecified 06/18/2013, 06/08/2015, 06/02/2016, 06/15/2019   Moderna Covid-19 Fall Seasonal Vaccine 81yrs & older 07/27/2022   Moderna Sars-Covid-2 Vaccination 09/08/2019, 10/14/2019, 07/28/2020, 06/02/2021   PNEUMOCOCCAL CONJUGATE-20 07/27/2022   Pneumococcal Conjugate-13 07/29/2019   Pneumococcal Polysaccharide-23 07/15/2016   RSV,unspecified 07/27/2022    Past Medical History:  Diagnosis Date   Allergy    Arthritis    Asthma    Cardiac arrhythmia    Chronic kidney disease    COPD (chronic obstructive pulmonary disease) (HCC)    Essential hypertension, benign    GERD (gastroesophageal reflux disease)    Impaired glucose tolerance    Injury of right rotator cuff    Lumbar disc disease    Mixed hyperlipidemia    OSA (obstructive sleep apnea)    Sleep apnea  Type 2 diabetes mellitus (HCC)    Vitamin D deficiency     Tobacco History: Social History   Tobacco Use  Smoking Status Former   Current packs/day: 0.00   Average packs/day: 1.3 packs/day for 51.0 years (63.8 ttl pk-yrs)   Types: Cigarettes   Start date: 83   Quit date: 2018   Years since quitting: 7.0  Smokeless Tobacco Never   Counseling given: Not Answered   Outpatient Medications Prior to Visit  Medication Sig Dispense Refill   albuterol (VENTOLIN HFA) 108 (90 Base) MCG/ACT inhaler  INHALE 1 TO 2 PUFFS BY MOUTH EVERY 6 HOURS AS NEEDED FOR WHEEZING FOR SHORTNESS OF BREATH 9 g 0   atorvastatin (LIPITOR) 80 MG tablet Take 1 tablet by mouth once daily 100 tablet 0   dapagliflozin propanediol (FARXIGA) 5 MG TABS tablet Take 5 mg by mouth daily.     enalapril (VASOTEC) 20 MG tablet TAKE 1 TABLET BY MOUTH ONCE DAILY 90 tablet 0   ezetimibe (ZETIA) 10 MG tablet Take 1 tablet (10 mg total) by mouth daily. 100 tablet 1   Fluticasone-Umeclidin-Vilant (TRELEGY ELLIPTA) 100-62.5-25 MCG/ACT AEPB INHALE 1 PUFF INTO LUNGS ONCE DAILY 180 each 6   furosemide (LASIX) 40 MG tablet Take 1 tablet (40 mg total) by mouth daily. 90 tablet 3   lansoprazole (PREVACID) 15 MG capsule Take 1 capsule (15 mg total) by mouth as needed. 100 capsule 1   naproxen sodium (ANAPROX) 220 MG tablet Take 220 mg by mouth as needed.     OVER THE COUNTER MEDICATION Areds ( omega 3, lutein, zeaxanthin) take one a day     SPIKEVAX syringe      hydrOXYzine (VISTARIL) 25 MG capsule Take 1 capsule (25 mg total) by mouth every 8 (eight) hours as needed for itching. 30 capsule 0   No facility-administered medications prior to visit.     Review of Systems:   Constitutional:   No  weight loss, night sweats,  Fevers, chills, +fatigue, or  lassitude.  HEENT:   No headaches,  Difficulty swallowing,  Tooth/dental problems, or  Sore throat,                No sneezing, itching, ear ache, nasal congestion, post nasal drip,   CV:  No chest pain,  Orthopnea, PND, swelling in lower extremities, anasarca, dizziness, palpitations, syncope.   GI  No heartburn, indigestion, abdominal pain, nausea, vomiting, diarrhea, change in bowel habits, loss of appetite, bloody stools.   Resp:   No chest wall deformity  Skin: no rash or lesions.  GU: no dysuria, change in color of urine, no urgency or frequency.  No flank pain, no hematuria   MS:  No joint pain or swelling.  No decreased range of motion.  No back pain.    Physical  Exam  BP 131/73   Pulse 73   Ht 5\' 9"  (1.753 m)   Wt 233 lb 3.2 oz (105.8 kg)   SpO2 93% Comment: room air  BMI 34.44 kg/m   GEN: A/Ox3; pleasant , NAD, well nourished    HEENT:  [place]/AT,  EACs-clear, TMs-wnl, NOSE-clear, THROAT-clear, no lesions, no postnasal drip or exudate noted.  Class III MP airway  NECK:  Supple w/ fair ROM; no JVD; normal carotid impulses w/o bruits; no thyromegaly or nodules palpated; no lymphadenopathy.    RESP  Clear  P & A; w/o, wheezes/ rales/ or rhonchi. no accessory muscle use, no dullness to percussion  CARD:  RRR,  no m/r/g, no peripheral edema, pulses intact, no cyanosis or clubbing.  GI:   Soft & nt; nml bowel sounds; no organomegaly or masses detected.   Musco: Warm bil, no deformities or joint swelling noted.   Neuro: alert, no focal deficits noted.    Skin: Warm, no lesions or rashes    Lab Results:  CBC    Component Value Date/Time   WBC 7.1 03/05/2022 0817   WBC 8.0 01/09/2020 1426   RBC 4.66 03/05/2022 0817   RBC 4.57 01/09/2020 1426   HGB 15.7 03/05/2022 0817   HCT 46.0 03/05/2022 0817   PLT 264 03/05/2022 0817   MCV 99 (H) 03/05/2022 0817   MCH 33.7 (H) 03/05/2022 0817   MCH 31.7 01/09/2020 1426   MCHC 34.1 03/05/2022 0817   MCHC 32.9 01/09/2020 1426   RDW 13.0 03/05/2022 0817   LYMPHSABS 2.3 08/22/2021 0826   EOSABS 0.5 (H) 08/22/2021 0826   BASOSABS 0.1 08/22/2021 0826    BMET    Component Value Date/Time   NA 142 03/10/2023 1119   K 5.0 03/10/2023 1119   CL 102 03/10/2023 1119   CO2 24 03/10/2023 1119   GLUCOSE 115 (H) 03/10/2023 1119   GLUCOSE 119 (H) 06/27/2014 0724   BUN 24 03/10/2023 1119   CREATININE 1.46 (H) 03/10/2023 1119   CREATININE 0.99 06/27/2014 0724   CALCIUM 9.9 03/10/2023 1119   GFRNONAA 56 (L) 07/24/2020 0824   GFRAA 65 07/24/2020 0824    BNP No results found for: "BNP"  ProBNP No results found for: "PROBNP"  Imaging: No results found.  Administration History     None            No data to display          No results found for: "NITRICOXIDE"      Assessment & Plan:   OSA (obstructive sleep apnea) Moderate to severe obstructive sleep apnea with excellent control compliance on nocturnal CPAP.  Patient has perceived benefit on CPAP.  Continue on current settings.  CPAP care discussed in detail.  Plan Patient Instructions  Continue on CPAP At bedtime, wear all night long for at least 6hrs or more  Work on healthy sleep regimen  Do not drive if sleepy  Work on healthy weight loss   Set up [facility] PFT. [facility])  Continue on Trelegy 1 puff  daily  Albuterol inhaler As needed   Mucinex DM As needed  cough/congestion  Can discuss with Dr. Adriana Simas that Vasotec may be aggravating your cough.  Follow up in 1 month to Sanchez over results  and As needed     '   COPD (chronic obstructive pulmonary disease) (HCC) Longstanding history of COPD with emphysema with heavy smoking history.  Patient continues to have significant symptom burden on triple therapy maintenance regimen.  He participates in the lung cancer CT screening program. For now would check pulmonary function testing for possible progressive decline.  Would benefit from pulmonary rehab.  Would also possibly be a candidate for Ohtuvayre nebs.  Will discuss on follow-up visit and review PFTs.  If cough continues could consider changing ACE inhibitor as it may be aggravating the cough but most likely not the underlying culprit for his cough.  Plan  Patient Instructions  Continue on CPAP At bedtime, wear all night long for at least 6hrs or more  Work on healthy sleep regimen  Do not drive if sleepy  Work on healthy weight loss   Set up  for PFT. [facility])  Continue on Trelegy 1 puff  daily  Albuterol inhaler As needed   Mucinex DM As needed  cough/congestion  Can discuss with Dr. Adriana Simas that Vasotec may be aggravating your cough.  Follow up in 1 month to Sanchez over results  and As needed       o     Rubye Oaks, NP 09/10/2023

## 2023-09-11 ENCOUNTER — Ambulatory Visit (HOSPITAL_COMMUNITY)
Admission: RE | Admit: 2023-09-11 | Discharge: 2023-09-11 | Disposition: A | Discharge status: Home / Self Care | Payer: PPO | Source: Ambulatory Visit | Attending: Nephrology | Admitting: Nephrology

## 2023-09-11 DIAGNOSIS — N2889 Other specified disorders of kidney and ureter: Secondary | ICD-10-CM | POA: Diagnosis not present

## 2023-09-11 DIAGNOSIS — E1122 Type 2 diabetes mellitus with diabetic chronic kidney disease: Secondary | ICD-10-CM | POA: Diagnosis not present

## 2023-09-11 DIAGNOSIS — N189 Chronic kidney disease, unspecified: Secondary | ICD-10-CM | POA: Insufficient documentation

## 2023-09-11 DIAGNOSIS — I129 Hypertensive chronic kidney disease with stage 1 through stage 4 chronic kidney disease, or unspecified chronic kidney disease: Secondary | ICD-10-CM | POA: Insufficient documentation

## 2023-09-11 DIAGNOSIS — N281 Cyst of kidney, acquired: Secondary | ICD-10-CM | POA: Insufficient documentation

## 2023-09-14 ENCOUNTER — Encounter (HOSPITAL_COMMUNITY): Payer: PPO

## 2023-09-20 ENCOUNTER — Other Ambulatory Visit: Payer: Self-pay | Admitting: Family Medicine

## 2023-09-21 ENCOUNTER — Other Ambulatory Visit: Payer: Self-pay

## 2023-09-21 MED ORDER — EZETIMIBE 10 MG PO TABS: 10.0000 mg | ORAL_TABLET | Freq: Every day | ORAL | 1 refills | Status: DC

## 2023-09-22 ENCOUNTER — Ambulatory Visit (HOSPITAL_COMMUNITY)
Admission: RE | Admit: 2023-09-22 | Discharge: 2023-09-22 | Disposition: A | Discharge status: Home / Self Care | Payer: PPO | Source: Ambulatory Visit | Attending: Adult Health | Admitting: Adult Health

## 2023-09-22 DIAGNOSIS — J449 Chronic obstructive pulmonary disease, unspecified: Secondary | ICD-10-CM | POA: Insufficient documentation

## 2023-09-22 LAB — PULMONARY FUNCTION TEST
DL/VA % pred: 60 %
DL/VA: 2.43 ml/min/mmHg/L
DLCO unc % pred: 52 %
DLCO unc: 12.83 ml/min/mmHg
FEF 25-75 Post: 0.68 L/s
FEF 25-75 Pre: 0.78 L/s
FEF2575-%Change-Post: -12 %
FEF2575-%Pred-Post: 31 %
FEF2575-%Pred-Pre: 35 %
FEV1-%Change-Post: -7 %
FEV1-%Pred-Post: 57 %
FEV1-%Pred-Pre: 62 %
FEV1-Post: 1.72 L
FEV1-Pre: 1.85 L
FEV1FVC-%Change-Post: -3 %
FEV1FVC-%Pred-Pre: 76 %
FEV6-%Change-Post: 1 %
FEV6-%Pred-Post: 79 %
FEV6-%Pred-Pre: 78 %
FEV6-Post: 3.07 L
FEV6-Pre: 3.02 L
FEV6FVC-%Change-Post: 2 %
FEV6FVC-%Pred-Post: 101 %
FEV6FVC-%Pred-Pre: 98 %
FVC-%Change-Post: -3 %
FVC-%Pred-Post: 78 %
FVC-%Pred-Pre: 81 %
FVC-Post: 3.22 L
FVC-Pre: 3.34 L
Post FEV1/FVC ratio: 53 %
Post FEV6/FVC ratio: 95 %
Pre FEV1/FVC ratio: 55 %
Pre FEV6/FVC Ratio: 93 %
RV % pred: 134 %
RV: 3.32 L
TLC % pred: 99 %
TLC: 6.78 L

## 2023-09-22 MED ORDER — ALBUTEROL SULFATE (2.5 MG/3ML) 0.083% IN NEBU
2.5000 mg | INHALATION_SOLUTION | Freq: Once | RESPIRATORY_TRACT | Status: AC
  Administered 2023-09-22: 2.5 mg via RESPIRATORY_TRACT

## 2023-09-26 ENCOUNTER — Other Ambulatory Visit: Payer: Self-pay | Admitting: Family Medicine

## 2023-09-26 DIAGNOSIS — E782 Mixed hyperlipidemia: Secondary | ICD-10-CM

## 2023-09-28 ENCOUNTER — Other Ambulatory Visit: Payer: Self-pay

## 2023-09-28 ENCOUNTER — Other Ambulatory Visit: Payer: Self-pay | Admitting: Family Medicine

## 2023-09-28 DIAGNOSIS — E782 Mixed hyperlipidemia: Secondary | ICD-10-CM

## 2023-09-28 MED ORDER — ATORVASTATIN CALCIUM 80 MG PO TABS
80.0000 mg | ORAL_TABLET | Freq: Every day | ORAL | 2 refills | Status: DC
Start: 2023-09-28 — End: 2024-04-11

## 2023-09-28 MED ORDER — ATORVASTATIN CALCIUM 80 MG PO TABS: 80.0000 mg | ORAL_TABLET | Freq: Every day | ORAL | 0 refills | Status: DC

## 2023-09-29 ENCOUNTER — Encounter: Payer: Self-pay | Admitting: Family Medicine

## 2023-10-01 ENCOUNTER — Telehealth: Payer: Self-pay | Admitting: Family Medicine

## 2023-10-01 NOTE — Telephone Encounter (Signed)
Patient has follow up on 2/20 and wanting to know if he needed labs done

## 2023-10-02 ENCOUNTER — Other Ambulatory Visit: Payer: Self-pay

## 2023-10-02 ENCOUNTER — Encounter: Payer: Self-pay | Admitting: Family Medicine

## 2023-10-02 DIAGNOSIS — R809 Proteinuria, unspecified: Secondary | ICD-10-CM | POA: Diagnosis not present

## 2023-10-02 DIAGNOSIS — I10 Essential (primary) hypertension: Secondary | ICD-10-CM

## 2023-10-02 DIAGNOSIS — N1831 Chronic kidney disease, stage 3a: Secondary | ICD-10-CM | POA: Diagnosis not present

## 2023-10-02 DIAGNOSIS — E782 Mixed hyperlipidemia: Secondary | ICD-10-CM | POA: Diagnosis not present

## 2023-10-02 DIAGNOSIS — D631 Anemia in chronic kidney disease: Secondary | ICD-10-CM | POA: Diagnosis not present

## 2023-10-02 DIAGNOSIS — N189 Chronic kidney disease, unspecified: Secondary | ICD-10-CM | POA: Diagnosis not present

## 2023-10-02 DIAGNOSIS — E119 Type 2 diabetes mellitus without complications: Secondary | ICD-10-CM

## 2023-10-04 LAB — COMPREHENSIVE METABOLIC PANEL
ALT: 30 [IU]/L (ref 0–44)
AST: 32 [IU]/L (ref 0–40)
Albumin: 4.6 g/dL (ref 3.8–4.8)
Alkaline Phosphatase: 89 [IU]/L (ref 44–121)
BUN/Creatinine Ratio: 16 (ref 10–24)
BUN: 23 mg/dL (ref 8–27)
Bilirubin Total: 0.7 mg/dL (ref 0.0–1.2)
CO2: 25 mmol/L (ref 20–29)
Calcium: 9.5 mg/dL (ref 8.6–10.2)
Chloride: 101 mmol/L (ref 96–106)
Creatinine, Ser: 1.48 mg/dL — ABNORMAL HIGH (ref 0.76–1.27)
Globulin, Total: 2.9 g/dL (ref 1.5–4.5)
Glucose: 97 mg/dL (ref 70–99)
Potassium: 3.9 mmol/L (ref 3.5–5.2)
Sodium: 142 mmol/L (ref 134–144)
Total Protein: 7.5 g/dL (ref 6.0–8.5)
eGFR: 49 mL/min/{1.73_m2} — ABNORMAL LOW (ref >59)

## 2023-10-04 LAB — CBC WITH DIFFERENTIAL/PLATELET
Basophils Absolute: 0.1 10*3/uL (ref 0.0–0.2)
Basos: 1 %
EOS (ABSOLUTE): 0.3 10*3/uL (ref 0.0–0.4)
Eos: 4 %
Hematocrit: 49 % (ref 37.5–51.0)
Hemoglobin: 16.5 g/dL (ref 13.0–17.7)
Immature Grans (Abs): 0 10*3/uL (ref 0.0–0.1)
Immature Granulocytes: 0 %
Lymphocytes Absolute: 1.8 10*3/uL (ref 0.7–3.1)
Lymphs: 22 %
MCH: 33.5 pg — ABNORMAL HIGH (ref 26.6–33.0)
MCHC: 33.7 g/dL (ref 31.5–35.7)
MCV: 99 fL — ABNORMAL HIGH (ref 79–97)
Monocytes Absolute: 0.9 10*3/uL (ref 0.1–0.9)
Monocytes: 11 %
Neutrophils Absolute: 5.1 10*3/uL (ref 1.4–7.0)
Neutrophils: 62 %
Platelets: 217 10*3/uL (ref 150–450)
RBC: 4.93 x10E6/uL (ref 4.14–5.80)
RDW: 12.6 % (ref 11.6–15.4)
WBC: 8.2 10*3/uL (ref 3.4–10.8)

## 2023-10-04 LAB — LIPID PANEL
Chol/HDL Ratio: 2 {ratio} (ref 0.0–5.0)
Cholesterol, Total: 170 mg/dL (ref 100–199)
HDL: 85 mg/dL (ref >39)
LDL Chol Calc (NIH): 71 mg/dL (ref 0–99)
Triglycerides: 72 mg/dL (ref 0–149)
VLDL Cholesterol Cal: 14 mg/dL (ref 5–40)

## 2023-10-04 LAB — MICROALBUMIN / CREATININE URINE RATIO
Creatinine, Urine: 141.2 mg/dL
Microalb/Creat Ratio: 57 mg/g{creat} — ABNORMAL HIGH (ref 0–29)
Microalbumin, Urine: 81 ug/mL

## 2023-10-04 LAB — HEMOGLOBIN A1C
Est. average glucose Bld gHb Est-mCnc: 146 mg/dL
Hgb A1c MFr Bld: 6.7 % — ABNORMAL HIGH (ref 4.8–5.6)

## 2023-10-06 ENCOUNTER — Encounter: Payer: Self-pay | Admitting: Adult Health

## 2023-10-06 ENCOUNTER — Ambulatory Visit: Payer: PPO | Admitting: Adult Health

## 2023-10-06 VITALS — BP 137/64 | HR 82 | Ht 69.0 in | Wt 234.0 lb

## 2023-10-06 DIAGNOSIS — J449 Chronic obstructive pulmonary disease, unspecified: Secondary | ICD-10-CM | POA: Diagnosis not present

## 2023-10-06 DIAGNOSIS — G4733 Obstructive sleep apnea (adult) (pediatric): Secondary | ICD-10-CM

## 2023-10-06 NOTE — Assessment & Plan Note (Signed)
Moderate to severe obstructive sleep apnea with excellent control and compliance on nocturnal CPAP.  Has perceived benefit on CPAP.  Continue on current settings.

## 2023-10-06 NOTE — Progress Notes (Signed)
@Patient  ID: Jerome Sanchez, male    DOB: 05/28/1951, 73 y.o.   MRN: [id number]  Chief Complaint  Patient presents with   Sleep Apnea    CPAP 5-15cmH2O   COPD    PFT completed 09/22/23    Referring provider: Tommie Sams, DO  HPI: 73 year old male former smoker seen for sleep consult November 03, 2022 for snoring and daytime sleepiness found to have moderate to severe sleep apnea Medical history significant for COPD Participates in the lung cancer CT screening program Medical history significant for coronary artery disease, moderate aortic valve stenosis, diastolic dysfunction, CKD   COPD :  62 yr PK hx smoking  1 cat at home No Oxygen  No hospital admission Trelegy rx  Pulmonary rehab rx 10/06/23  Yearly LDCT chest program  PFTs that were done on September 22, 2023 that showed moderate COPD with an FEV1 at 62%, ratio 55, FVC 81%, DLCO 52%. Influenza, RSV, Prevnar 20 up-to-date 09/2023    TEST/EVENTS :  Home sleep study Dec 19, 2022 showed moderate to severe sleep apnea with AHI 29/hour and SpO2 low at 71%.   Low-dose CT chest July 06, 2023 showed a lung RADS 2, moderate emphysema with diffuse bronchial wall thickening , stable pulmonary nodules  2D echo September 24, 2022 showed EF at 60-65%, grade 1 diastolic dysfunction, RV SF normal, right ventricular size normal, mild to moderate aortic valve stenosis and mild thickening of the aortic valve  PFTs that were done on September 22, 2023 that showed moderate COPD with an FEV1 at 62%, ratio 55, FVC 81%, DLCO 52%.  10/06/2023 Follow up ; COPD and OSA  Patient returns for 1 month follow-up.  Patient has a history of longstanding COPD with emphysema with heavy smoking history.  He is maintained on Trelegy inhaler daily.  Last visit was having increased shortness of breath with activity on occasion and productive cough and post nasal drainage.  Patient was set up for PFTs that were done on September 22, 2023 that showed moderate COPD with  an FEV1 at 62%, ratio 55, FVC 81%, DLCO 52%. Discussed in detail the results.  Since last visit patient says he is doing about the same.  He remains active.  Does have a congested cough and postnasal drainage.  We  Discussed Ohtuvayre nebs to help with symptom management -  ,wants to hold off at this time. Discussed pulmonary rehab and is open to this referral.  Lives at home. Drives. Does yard work. . Has 1 cat  On Ace inhibitor  Vaccines with influenza, RSV and Prevnar 20 up-to-date   Patient has moderate to severe sleep apnea remains on CPAP at bedtime.  Says he is doing well on CPAP.  Feels rested with no significant daytime sleepiness.  CPAP download shows excellent compliance with 100% usage.  Daily average usage at 8 hours.  Patient is on auto CPAP 5 to 15 cm H2O.  AHI 0.7/hour.  DME is [employer] . Uses a nasal mask .        Allergies  Allergen Reactions   Penicillins     N&V    Immunization History  Administered Date(s) Administered   Fluad Quad(high Dose 65+) 05/18/2020, 07/27/2022   Fluad Trivalent(High Dose 65+) 05/13/2023   Influenza,inj,Quad PF,6+ Mos 07/27/2018, 08/27/2021   Influenza-Unspecified 06/18/2013, 06/08/2015, 06/02/2016, 06/15/2019   Moderna Covid-19 Fall Seasonal Vaccine 16yrs & older 07/27/2022, 07/10/2023   Moderna Sars-Covid-2 Vaccination 09/08/2019, 10/14/2019, 07/28/2020, 06/02/2021   PNEUMOCOCCAL CONJUGATE-20  07/27/2022   Pneumococcal Conjugate-13 07/29/2019   Pneumococcal Polysaccharide-23 07/15/2016   RSV,unspecified 07/27/2022    Past Medical History:  Diagnosis Date   Allergy    Arthritis    Asthma    Cardiac arrhythmia    Chronic kidney disease    COPD (chronic obstructive pulmonary disease) (HCC)    Essential hypertension, benign    GERD (gastroesophageal reflux disease)    Impaired glucose tolerance    Injury of right rotator cuff    Lumbar disc disease    Mixed hyperlipidemia    OSA (obstructive sleep apnea)    Sleep  apnea    Type 2 diabetes mellitus (HCC)    Vitamin D deficiency     Tobacco History: Social History   Tobacco Use  Smoking Status Former   Current packs/day: 0.00   Average packs/day: 1.3 packs/day for 51.0 years (63.8 ttl pk-yrs)   Types: Cigarettes   Start date: 18   Quit date: 2018   Years since quitting: 7.1  Smokeless Tobacco Never   Counseling given: Not Answered   Outpatient Medications Prior to Visit  Medication Sig Dispense Refill   albuterol (VENTOLIN HFA) 108 (90 Base) MCG/ACT inhaler INHALE 1 TO 2 PUFFS BY MOUTH EVERY 6 HOURS AS NEEDED FOR WHEEZING FOR SHORTNESS OF BREATH 9 g 0   atorvastatin (LIPITOR) 80 MG tablet Take 1 tablet (80 mg total) by mouth daily. 100 tablet 2   dapagliflozin propanediol (FARXIGA) 5 MG TABS tablet Take 5 mg by mouth daily.     enalapril (VASOTEC) 20 MG tablet TAKE 1 TABLET BY MOUTH ONCE DAILY 90 tablet 0   ezetimibe (ZETIA) 10 MG tablet Take 1 tablet (10 mg total) by mouth daily. 100 tablet 1   Fluticasone-Umeclidin-Vilant (TRELEGY ELLIPTA) 100-62.5-25 MCG/ACT AEPB INHALE 1 PUFF INTO LUNGS ONCE DAILY 180 each 6   furosemide (LASIX) 40 MG tablet Take 1 tablet (40 mg total) by mouth daily. 90 tablet 3   lansoprazole (PREVACID) 15 MG capsule Take 1 capsule (15 mg total) by mouth as needed. 100 capsule 1   naproxen sodium (ANAPROX) 220 MG tablet Take 220 mg by mouth as needed.     OVER THE COUNTER MEDICATION Areds ( omega 3, lutein, zeaxanthin) take one a day     hydrOXYzine (VISTARIL) 25 MG capsule Take 1 capsule (25 mg total) by mouth every 8 (eight) hours as needed for itching. 30 capsule 0   SPIKEVAX syringe      No facility-administered medications prior to visit.     Review of Systems:   Constitutional:   No  weight loss, night sweats,  Fevers, chills, + fatigue, or  lassitude.  HEENT:   No headaches,  Difficulty swallowing,  Tooth/dental problems, or  Sore throat,                No sneezing, itching, ear ache,  +nasal  congestion, post nasal drip,   CV:  No chest pain,  Orthopnea, PND, swelling in lower extremities, anasarca, dizziness, palpitations, syncope.   GI  No heartburn, indigestion, abdominal pain, nausea, vomiting, diarrhea, change in bowel habits, loss of appetite, bloody stools.   Resp:   No wheezing.  No chest wall deformity  Skin: no rash or lesions.  GU: no dysuria, change in color of urine, no urgency or frequency.  No flank pain, no hematuria   MS:  No joint pain or swelling.  No decreased range of motion.  No back pain.    Physical Exam  BP 137/64   Pulse 82   Ht 5\' 9"  (1.753 m)   Wt 234 lb (106.1 kg)   SpO2 93%   BMI 34.56 kg/m   GEN: A/Ox3; pleasant , NAD, well nourished    HEENT:  [place]/AT,  NOSE-clear, THROAT-clear, no lesions, no postnasal drip or exudate noted.   NECK:  Supple w/ fair ROM; no JVD; normal carotid impulses w/o bruits; no thyromegaly or nodules palpated; no lymphadenopathy.    RESP few trace rhonchi  no accessory muscle use, no dullness to percussion  CARD:  RRR, no m/r/g, no peripheral edema, pulses intact, no cyanosis or clubbing.  GI:   Soft & nt; nml bowel sounds; no organomegaly or masses detected.   Musco: Warm bil, no deformities or joint swelling noted.   Neuro: alert, no focal deficits noted.    Skin: Warm, no lesions or rashes    Lab Results:  CBC    Component Value Date/Time   WBC 8.2 10/02/2023 1434   WBC 8.0 01/09/2020 1426   RBC 4.93 10/02/2023 1434   RBC 4.57 01/09/2020 1426   HGB 16.5 10/02/2023 1434   HCT 49.0 10/02/2023 1434   PLT 217 10/02/2023 1434   MCV 99 (H) 10/02/2023 1434   MCH 33.5 (H) 10/02/2023 1434   MCH 31.7 01/09/2020 1426   MCHC 33.7 10/02/2023 1434   MCHC 32.9 01/09/2020 1426   RDW 12.6 10/02/2023 1434   LYMPHSABS 1.8 10/02/2023 1434   EOSABS 0.3 10/02/2023 1434   BASOSABS 0.1 10/02/2023 1434    BMET    Component Value Date/Time   NA 142 10/02/2023 1434   K 3.9 10/02/2023 1434   CL 101  10/02/2023 1434   CO2 25 10/02/2023 1434   GLUCOSE 97 10/02/2023 1434   GLUCOSE 119 (H) 06/27/2014 0724   BUN 23 10/02/2023 1434   CREATININE 1.48 (H) 10/02/2023 1434   CREATININE 0.99 06/27/2014 0724   CALCIUM 9.5 10/02/2023 1434   GFRNONAA 56 (L) 07/24/2020 0824   GFRAA 65 07/24/2020 0824    BNP No results found for: "BNP"  ProBNP No results found for: "PROBNP"  Imaging: US RENAL Result Date: 09/13/2023 CLINICAL DATA:  Follow-up renal mass EXAM: RENAL / URINARY TRACT ULTRASOUND COMPLETE COMPARISON:  Renal ultrasound 10/06/2022; CT abdomen 11/06/2022 FINDINGS: Right Kidney: Renal measurements: 16.9 x 8.5 x 8.6 cm = volume: 648.4 mL. Normal renal cortical thickness and echogenicity. There is a 5.6 x 5.4 x 7.4 cm lobular complex cystic mass superior pole right kidney, previously 7.0 x 7.0 x 6.7 cm. Overall somewhat similar given differences in measurement technique. Left Kidney: Renal measurements: 12.1 x 6.0 x 5.7 cm = volume: 215.4 mL. Echogenicity within normal limits. No mass or hydronephrosis visualized. Bladder: Appears normal for degree of bladder distention. Other: None. IMPRESSION: Complex cystic mass superior pole right kidney, overall similar given differences in measurement technique. Recommend continued attention on follow-up. Electronically Signed   By: Annia Belt M.D.   On: 09/13/2023 16:38    Administration History     None          Latest Ref Rng & Units 09/22/2023    1:42 PM  PFT Results  FVC-Pre L 3.34   FVC-Predicted Pre % 81   FVC-Post L 3.22   FVC-Predicted Post % 78   Pre FEV1/FVC % % 55   Post FEV1/FCV % % 53   FEV1-Pre L 1.85   FEV1-Predicted Pre % 62   FEV1-Post L 1.72   DLCO uncorrected  ml/min/mmHg 12.83   DLCO UNC% % 52   DLVA Predicted % 60   TLC L 6.78   TLC % Predicted % 99   RV % Predicted % 134     No results found for: "NITRICOXIDE"      Assessment & Plan:   OSA (obstructive sleep apnea) Moderate to severe obstructive sleep  apnea with excellent control and compliance on nocturnal CPAP.  Has perceived benefit on CPAP.  Continue on current settings.  COPD (chronic obstructive pulmonary disease) (HCC) Moderate COPD with emphysema.  PFTs were done and reviewed in detail with patient.  Patient continues to have some symptom burden with shortness of breath with activities which is suspected multifactorial with physical deconditioning, diastolic heart failure, aortic valve stenosis Will refer to pulmonary rehab. Could consider adding Ohytuvayre nebs going forward  Also ACE inhibitor may be aggravating cough .   Plan  Patient Instructions  Continue on CPAP At bedtime, wear all night long for at least 6hrs or more  Work on healthy sleep regimen  Do not drive if sleepy  Work on healthy weight loss    Refer to pulmonary rehab.  Continue on Trelegy 1 puff  daily, rinse after use.  Albuterol inhaler As needed   Mucinex DM As needed  cough/congestion  Try Allegra 180mg  daily As needed   Saline nasal rinses Twice daily  .  Can discuss with Dr. Adriana Simas that Vasotec may be aggravating your cough.  Follow up with Dr. Sherene Sires  in 6 months and As needed          Rubye Oaks, NP 10/06/2023

## 2023-10-06 NOTE — Patient Instructions (Addendum)
Continue on CPAP At bedtime, wear all night long for at least 6hrs or more  Work on healthy sleep regimen  Do not drive if sleepy  Work on healthy weight loss    Refer to pulmonary rehab.  Continue on Trelegy 1 puff  daily, rinse after use.  Albuterol inhaler As needed   Mucinex DM As needed  cough/congestion  Try Allegra 180mg  daily As needed   Saline nasal rinses Twice daily  .  Can discuss with Dr. Adriana Simas that Vasotec may be aggravating your cough.  Follow up with Dr. Sherene Sires  in 6 months and As needed

## 2023-10-06 NOTE — Assessment & Plan Note (Signed)
Moderate COPD with emphysema.  PFTs were done and reviewed in detail with patient.  Patient continues to have some symptom burden with shortness of breath with activities which is suspected multifactorial with physical deconditioning, diastolic heart failure, aortic valve stenosis Will refer to pulmonary rehab. Could consider adding Ohytuvayre nebs going forward  Also ACE inhibitor may be aggravating cough .   Plan  Patient Instructions  Continue on CPAP At bedtime, wear all night long for at least 6hrs or more  Work on healthy sleep regimen  Do not drive if sleepy  Work on healthy weight loss    Refer to pulmonary rehab.  Continue on Trelegy 1 puff  daily, rinse after use.  Albuterol inhaler As needed   Mucinex DM As needed  cough/congestion  Try Allegra 180mg  daily As needed   Saline nasal rinses Twice daily  .  Can discuss with Dr. Adriana Simas that Vasotec may be aggravating your cough.  Follow up with Dr. Sherene Sires  in 6 months and As needed

## 2023-10-07 ENCOUNTER — Encounter (HOSPITAL_COMMUNITY): Payer: Self-pay

## 2023-10-08 ENCOUNTER — Ambulatory Visit: Payer: PPO | Admitting: Family Medicine

## 2023-10-09 DIAGNOSIS — G4733 Obstructive sleep apnea (adult) (pediatric): Secondary | ICD-10-CM | POA: Diagnosis not present

## 2023-10-12 ENCOUNTER — Ambulatory Visit (INDEPENDENT_AMBULATORY_CARE_PROVIDER_SITE_OTHER): Payer: PPO | Admitting: Family Medicine

## 2023-10-12 VITALS — BP 126/86 | HR 88 | Temp 97.0°F | Ht 69.0 in | Wt 235.0 lb

## 2023-10-12 DIAGNOSIS — Z7984 Long term (current) use of oral hypoglycemic drugs: Secondary | ICD-10-CM

## 2023-10-12 DIAGNOSIS — I10 Essential (primary) hypertension: Secondary | ICD-10-CM | POA: Diagnosis not present

## 2023-10-12 DIAGNOSIS — E782 Mixed hyperlipidemia: Secondary | ICD-10-CM | POA: Diagnosis not present

## 2023-10-12 DIAGNOSIS — J449 Chronic obstructive pulmonary disease, unspecified: Secondary | ICD-10-CM | POA: Diagnosis not present

## 2023-10-12 DIAGNOSIS — E118 Type 2 diabetes mellitus with unspecified complications: Secondary | ICD-10-CM | POA: Insufficient documentation

## 2023-10-12 MED ORDER — VALSARTAN 80 MG PO TABS: 80.0000 mg | ORAL_TABLET | Freq: Every day | ORAL | 3 refills | Status: AC

## 2023-10-12 NOTE — Patient Instructions (Signed)
 Stop enalapril.  Start Valsartan.  Monitor BP.  Follow up in 6 months.

## 2023-10-12 NOTE — Assessment & Plan Note (Signed)
 Lipid panel today to assess.  Last LDL was 71.  Continue statin and Zetia.

## 2023-10-12 NOTE — Assessment & Plan Note (Signed)
 A1c today to assess.  Continue Farxiga.

## 2023-10-12 NOTE — Assessment & Plan Note (Signed)
 Stable.  However, switching enalapril to valsartan due to patient's ongoing cough.

## 2023-10-12 NOTE — Assessment & Plan Note (Signed)
 Discontinue ACE inhibitor.  Starting valsartan.  Hopeful that this will improve cough at least some

## 2023-10-12 NOTE — Progress Notes (Signed)
 Subjective:  Patient ID: Jerome Sanchez, male    DOB: June 01, 1951  Age: 73 y.o. MRN: [id number]  CC:   Chief Complaint  Patient presents with   Hypertension    Discuss vasotec - patient was advised per pulmonologist could rule out this medication due to ongoing cough and lung issues     HPI:  73 year old male with below mentioned medical problems presents for follow-up.  Patient following with pulmonology.  There is concerned that ACE inhibitor is aggravating/worsening cough.  Patient wants to discuss switching this medication.  Patient is continuing on Trelegy.  Has been referred to pulmonary rehab.  Blood pressure well-controlled here today.  Needs labs today.  Reassessing control of lipids as well as A1c.  Currently on Farxiga for kidney disease as well as type 2 diabetes.  Patient Active Problem List   Diagnosis Date Noted   Type 2 diabetes mellitus with complication (HCC) 10/12/2023   Aortic stenosis 02/04/2023   (HFpEF) heart failure with preserved ejection fraction (HCC) 02/04/2023   OSA (obstructive sleep apnea) 01/13/2023   Lesion of right native kidney 09/09/2022   Stage 3a chronic kidney disease (HCC) 07/15/2022   CAD (coronary artery disease) 07/15/2022   Former smoker 03/04/2022   Barrett's esophagus 01/26/2020   COPD (chronic obstructive pulmonary disease) (HCC) 07/27/2018   Mixed hyperlipidemia 08/05/2012   Essential hypertension, benign 10/26/2008    Social Hx   Social History   Socioeconomic History   Marital status: Married    Spouse name: Not on file   Number of children: Not on file   Years of education: Not on file   Highest education level: Bachelor's degree (e.g., BA, AB, BS)  Occupational History   Not on file  Tobacco Use   Smoking status: Former    Current packs/day: 0.00    Average packs/day: 1.3 packs/day for 51.0 years (63.8 ttl pk-yrs)    Types: Cigarettes    Start date: 70    Quit date: 2018    Years since quitting: 7.1    Smokeless tobacco: Never  Vaping Use   Vaping status: Never Used  Substance and Sexual Activity   Alcohol use: Yes    Alcohol/week: 6.0 standard drinks of alcohol    Types: 6 Glasses of wine per week    Comment: 1-2 glasses per night   Drug use: No   Sexual activity: Yes    Birth control/protection: None  Other Topics Concern   Not on file  Social History Narrative   Not on file   Social Drivers of Health   Financial Resource Strain: Low Risk  (10/08/2023)   Overall Financial Resource Strain (CARDIA)    Difficulty of Paying Living Expenses: Not hard at all  Food Insecurity: No Food Insecurity (10/08/2023)   Hunger Vital Sign    Worried About Running Out of Food in the Last Year: Never true    Ran Out of Food in the Last Year: Never true  Transportation Needs: No Transportation Needs (10/08/2023)   PRAPARE - Administrator, Civil Service (Medical): No    Lack of Transportation (Non-Medical): No  Physical Activity: Insufficiently Active (10/08/2023)   Exercise Vital Sign    Days of Exercise per Week: 4 days    Minutes of Exercise per Session: 30 min  Stress: No Stress Concern Present (10/08/2023)   [employer] of Occupational Health - Occupational Stress Questionnaire    Feeling of Stress : Only a little  Social Connections: Socially  Isolated (10/08/2023)   Social Connection and Isolation Panel [NHANES]    Frequency of Communication with Friends and Family: Once a week    Frequency of Social Gatherings with Friends and Family: Once a week    Attends Religious Services: Never    Diplomatic Services operational officer: No    Attends Engineer, structural: Never    Marital Status: Married    Review of Systems Per HPI  Objective:  BP 126/86   Pulse 88   Temp (!) 97 F (36.1 C)   Ht 5\' 9"  (1.753 m)   Wt 235 lb (106.6 kg)   SpO2 94%   BMI 34.70 kg/m      10/12/2023    9:00 AM 10/06/2023    1:22 PM 09/10/2023   10:01 AM  BP/Weight  Systolic BP  126 161 131  Diastolic BP 86 64 73  Wt. (Lbs) 235 234 233.2  BMI 34.7 kg/m2 34.56 kg/m2 34.44 kg/m2    Physical Exam Vitals and nursing note reviewed.  Constitutional:      General: He is not in acute distress.    Appearance: Normal appearance. He is obese.  HENT:     Head: Normocephalic and atraumatic.  Eyes:     General:        Right eye: No discharge.        Left eye: No discharge.     Conjunctiva/sclera: Conjunctivae normal.  Cardiovascular:     Rate and Rhythm: Normal rate and regular rhythm.  Pulmonary:     Effort: Pulmonary effort is normal.     Breath sounds: No wheezing or rales.  Feet:     Comments: Diabetic Foot Check -  Appearance - no lesions, ulcers or calluses Skin - no unusual pallor or redness Monofilament testing -  Right - Great toe, medial, central, lateral ball and posterior foot intact Left - Great toe, medial, central, lateral ball and posterior foot intact  Neurological:     Mental Status: He is alert.  Psychiatric:        Mood and Affect: Mood normal.        Behavior: Behavior normal.     Lab Results  Component Value Date   WBC 8.2 10/02/2023   HGB 16.5 10/02/2023   HCT 49.0 10/02/2023   PLT 217 10/02/2023   GLUCOSE 97 10/02/2023   CHOL 170 10/02/2023   TRIG 72 10/02/2023   HDL 85 10/02/2023   LDLCALC 71 10/02/2023   ALT 30 10/02/2023   AST 32 10/02/2023   NA 142 10/02/2023   K 3.9 10/02/2023   CL 101 10/02/2023   CREATININE 1.48 (H) 10/02/2023   BUN 23 10/02/2023   CO2 25 10/02/2023   PSA 0.66 06/27/2014   HGBA1C 6.7 (H) 10/02/2023     Assessment & Plan:  Type 2 diabetes mellitus with complication (HCC) Assessment & Plan: A1c today to assess.  Continue Farxiga.   Essential hypertension, benign Assessment & Plan: Stable.  However, switching enalapril to valsartan due to patient's ongoing cough.   Chronic obstructive pulmonary disease, unspecified COPD type (HCC) Assessment & Plan: Discontinue ACE inhibitor.  Starting  valsartan.  Hopeful that this will improve cough at least some   Mixed hyperlipidemia Assessment & Plan: Lipid panel today to assess.  Last LDL was 71.  Continue statin and Zetia.   Other orders -     Valsartan; Take 1 tablet (80 mg total) by mouth daily.  Dispense: 100 tablet; Refill: 3  Follow-up:  Return in about 6 months (around 04/10/2024) for Follow up Chronic medical issues.  Everlene Other DO [facility]

## 2023-10-14 ENCOUNTER — Ambulatory Visit: Payer: PPO | Admitting: Family Medicine

## 2023-10-14 ENCOUNTER — Encounter (HOSPITAL_COMMUNITY)
Admission: RE | Admit: 2023-10-14 | Discharge: 2023-10-14 | Disposition: A | Discharge status: Home / Self Care | Payer: PPO | Source: Ambulatory Visit | Attending: Internal Medicine | Admitting: Internal Medicine

## 2023-10-14 VITALS — Ht 67.0 in | Wt 233.0 lb

## 2023-10-14 DIAGNOSIS — J449 Chronic obstructive pulmonary disease, unspecified: Secondary | ICD-10-CM | POA: Diagnosis not present

## 2023-10-14 NOTE — Progress Notes (Signed)
 Pulmonary Individual Treatment Plan  Patient Details  Name: Jerome Sanchez MRN: [id number] Date of Birth: [birth year] Referring Provider:   Flowsheet Row PULMONARY REHAB COPD ORIENTATION from 10/14/2023 in [facility] CARDIAC REHABILITATION  Referring Provider Sandrea Hughs MD       Initial Encounter Date:  Flowsheet Row PULMONARY REHAB COPD ORIENTATION from 10/14/2023 in [facility]ARDIAC REHABILITATION  Date 10/14/23       Visit Diagnosis: Chronic obstructive pulmonary disease, unspecified COPD type (HCC)  Patient's Home Medications on Admission:   Current Outpatient Medications:    albuterol (VENTOLIN HFA) 108 (90 Base) MCG/ACT inhaler, INHALE 1 TO 2 PUFFS BY MOUTH EVERY 6 HOURS AS NEEDED FOR WHEEZING FOR SHORTNESS OF BREATH, Disp: 9 g, Rfl: 0   atorvastatin (LIPITOR) 80 MG tablet, Take 1 tablet (80 mg total) by mouth daily., Disp: 100 tablet, Rfl: 2   dapagliflozin propanediol (FARXIGA) 5 MG TABS tablet, Take 5 mg by mouth daily., Disp: , Rfl:    ezetimibe (ZETIA) 10 MG tablet, Take 1 tablet (10 mg total) by mouth daily., Disp: 100 tablet, Rfl: 1   Fluticasone-Umeclidin-Vilant (TRELEGY ELLIPTA) 100-62.5-25 MCG/ACT AEPB, INHALE 1 PUFF INTO LUNGS ONCE DAILY, Disp: 180 each, Rfl: 6   furosemide (LASIX) 40 MG tablet, Take 1 tablet (40 mg total) by mouth daily., Disp: 90 tablet, Rfl: 3   lansoprazole (PREVACID) 15 MG capsule, Take 1 capsule (15 mg total) by mouth as needed., Disp: 100 capsule, Rfl: 1   OVER THE COUNTER MEDICATION, Areds ( omega 3, lutein, zeaxanthin) take one a day, Disp: , Rfl:    valsartan (DIOVAN) 80 MG tablet, Take 1 tablet (80 mg total) by mouth daily., Disp: 100 tablet, Rfl: 3  Past Medical History: Past Medical History:  Diagnosis Date   Allergy    Arthritis    Asthma    Cardiac arrhythmia    Chronic kidney disease    COPD (chronic obstructive pulmonary disease) (HCC)    Essential hypertension, benign    GERD (gastroesophageal reflux disease)     Impaired glucose tolerance    Injury of right rotator cuff    Lumbar disc disease    Mixed hyperlipidemia    OSA (obstructive sleep apnea)    Sleep apnea    Type 2 diabetes mellitus (HCC)    Vitamin D deficiency     Tobacco Use: Social History   Tobacco Use  Smoking Status Former   Current packs/day: 0.00   Average packs/day: 1.3 packs/day for 51.0 years (63.8 ttl pk-yrs)   Types: Cigarettes   Start date: 53   Quit date: 2018   Years since quitting: 7.1  Smokeless Tobacco Never    Labs: Review Flowsheet  More data exists      Latest Ref Rng & Units 08/22/2021 03/05/2022 07/15/2022 06/22/2023 10/02/2023  Labs for ITP Cardiac and Pulmonary Rehab  Cholestrol 100 - 199 mg/dL 696  295  284  132  440   LDL (calc) 0 - 99 mg/dL 102  725  74  67  71   HDL-C >39 mg/dL 79  84  94  79  85   Trlycerides 0 - 149 mg/dL 366  76  81  62  72   Hemoglobin A1c 4.8 - 5.6 % 6.1  6.0  - 6.6  6.7     Capillary Blood Glucose: No results found for: "GLUCAP"   Pulmonary Assessment Scores:  Pulmonary Assessment Scores     Row Name 10/14/23 [phone]  ADL UCSD   ADL Phase Entry     SOB Score total 33     Rest 0     Walk 2     Stairs 3     Bath 1     Dress 0     Shop 1       CAT Score   CAT Score 14             UCSD: Self-administered rating of dyspnea associated with activities of daily living (ADLs) 6-point scale (0 = "not at all" to 5 = "maximal or unable to do because of breathlessness")  Scoring Scores range from 0 to 120.  Minimally important difference is 5 units  CAT: CAT can identify the health impairment of COPD patients and is better correlated with disease progression.  CAT has a scoring range of zero to 40. The CAT score is classified into four groups of low (less than 10), medium (10 - 20), high (21-30) and very high (31-40) based on the impact level of disease on health status. A CAT score over 10 suggests significant symptoms.  A worsening CAT score could be  explained by an exacerbation, poor medication adherence, poor inhaler technique, or progression of COPD or comorbid conditions.  CAT MCID is 2 points  mMRC: mMRC (Modified Medical Research Council) Dyspnea Scale is used to assess the degree of baseline functional disability in patients of respiratory disease due to dyspnea. No minimal important difference is established. A decrease in score of 1 point or greater is considered a positive change.   Pulmonary Function Assessment:   Exercise Target Goals: Exercise Program Goal: Individual exercise prescription set using results from initial 6 min walk test and THRR while considering  patient's activity barriers and safety.   Exercise Prescription Goal: Initial exercise prescription builds to 30-45 minutes a day of aerobic activity, 2-3 days per week.  Home exercise guidelines will be given to patient during program as part of exercise prescription that the participant will acknowledge.  Activity Barriers & Risk Stratification:  Activity Barriers & Cardiac Risk Stratification - 10/14/23 1426       Activity Barriers & Cardiac Risk Stratification   Activity Barriers Arthritis;Back Problems;Balance Concerns;Shortness of Breath   Arthritis in both shoulders, Back pain due to a crushed disc.            6 Minute Walk:  6 Minute Walk     Row Name 10/14/23 1510 10/14/23 1511       6 Minute Walk   Phase Initial --    Distance 1200 feet --    Walk Time 6 minutes --    # of Rest Breaks 0 --    MPH 2.27 --    METS 2.6 --    RPE 13 --    Perceived Dyspnea  2 --    VO2 Peak 9.1 --    Symptoms No --    Resting HR 73 bpm --    Resting BP 130/80 --    Resting Oxygen Saturation  92 % --    Exercise Oxygen Saturation  during 6 min walk 96 % --    Max Ex. HR 150 bpm --    Max Ex. BP 142/80 --    2 Minute Post BP 132/80 --      Interval HR   1 Minute HR 133 --    2 Minute HR 140 --    3 Minute HR 146 --    4 Minute  HR 140 --    5  Minute HR 143 --    6 Minute HR 150 --    2 Minute Post HR 86 --    Interval Heart Rate? Yes --      Interval Oxygen   Interval Oxygen? -- Yes    Baseline Oxygen Saturation % -- 92 %    1 Minute Oxygen Saturation % -- 88 %    1 Minute Liters of Oxygen -- 0 L    2 Minute Oxygen Saturation % -- 86 %    2 Minute Liters of Oxygen -- 0 L    3 Minute Oxygen Saturation % -- 86 %    3 Minute Liters of Oxygen -- 0 L    4 Minute Oxygen Saturation % -- 86 %    4 Minute Liters of Oxygen -- 0 L    5 Minute Oxygen Saturation % -- 87 %    5 Minute Liters of Oxygen -- 0 L    6 Minute Oxygen Saturation % -- 87 %    6 Minute Liters of Oxygen -- 0 L    2 Minute Post Oxygen Saturation % -- 93 %    2 Minute Post Liters of Oxygen -- 0 L             Oxygen Initial Assessment:  Oxygen Initial Assessment - 10/14/23 1431       Home Oxygen   Home Oxygen Device None    Sleep Oxygen Prescription CPAP   At night   Home Exercise Oxygen Prescription None    Home Resting Oxygen Prescription None    Compliance with Home Oxygen Use Yes   Compliance with CPAP use            Oxygen Re-Evaluation:   Oxygen Discharge (Final Oxygen Re-Evaluation):   Initial Exercise Prescription:  Initial Exercise Prescription - 10/14/23 1500       Date of Initial Exercise RX and Referring Provider   Date 10/14/23    Referring Provider Sandrea Hughs MD      Oxygen   Maintain Oxygen Saturation 88% or higher      Treadmill   MPH 1.5    Grade 0    Minutes 15    METs 1.9      REL-XR   Level 1    Speed 50    Minutes 15    METs 1.8      Prescription Details   Frequency (times per week) 2    Duration Progress to 30 minutes of continuous aerobic without signs/symptoms of physical distress      Intensity   THRR 40-80% of Max Heartrate 102/131    Ratings of Perceived Exertion 11-13    Perceived Dyspnea 0-4      Resistance Training   Training Prescription Yes    Weight 4    Reps 10-15              Perform Capillary Blood Glucose checks as needed.  Exercise Prescription Changes:   Exercise Prescription Changes     Row Name 10/14/23 1500             Response to Exercise   Blood Pressure (Admit) 130/80       Blood Pressure (Exercise) 142/80       Blood Pressure (Exit) 132/80       Heart Rate (Admit) 73 bpm       Heart Rate (Exercise) 150 bpm  Heart Rate (Exit) 86 bpm       Oxygen Saturation (Admit) 92 %       Oxygen Saturation (Exercise) 86 %       Oxygen Saturation (Exit) 93 %       Rating of Perceived Exertion (Exercise) 13       Perceived Dyspnea (Exercise) 2       Duration Continue with 30 min of aerobic exercise without signs/symptoms of physical distress.       Intensity THRR unchanged         Progression   Progression Continue to progress workloads to maintain intensity without signs/symptoms of physical distress.                Exercise Comments:   Exercise Goals and Review:   Exercise Goals     Row Name 10/14/23 1514             Exercise Goals   Increase Physical Activity Yes       Intervention Provide advice, education, support and counseling about physical activity/exercise needs.;Develop an individualized exercise prescription for aerobic and resistive training based on initial evaluation findings, risk stratification, comorbidities and participant's personal goals.       Expected Outcomes Short Term: Attend rehab on a regular basis to increase amount of physical activity.;Long Term: Add in home exercise to make exercise part of routine and to increase amount of physical activity.;Long Term: Exercising regularly at least 3-5 days a week.       Increase Strength and Stamina Yes       Intervention Provide advice, education, support and counseling about physical activity/exercise needs.;Develop an individualized exercise prescription for aerobic and resistive training based on initial evaluation findings, risk stratification,  comorbidities and participant's personal goals.       Expected Outcomes Short Term: Increase workloads from initial exercise prescription for resistance, speed, and METs.;Short Term: Perform resistance training exercises routinely during rehab and add in resistance training at home;Long Term: Improve cardiorespiratory fitness, muscular endurance and strength as measured by increased METs and functional capacity ( )       Able to understand and use rate of perceived exertion (RPE) scale Yes       Intervention Provide education and explanation on how to use RPE scale       Expected Outcomes Short Term: Able to use RPE daily in rehab to express subjective intensity level;Long Term:  Able to use RPE to guide intensity level when exercising independently       Able to understand and use Dyspnea scale Yes       Intervention Provide education and explanation on how to use Dyspnea scale       Expected Outcomes Short Term: Able to use Dyspnea scale daily in rehab to express subjective sense of shortness of breath during exertion;Long Term: Able to use Dyspnea scale to guide intensity level when exercising independently       Knowledge and understanding of Target Heart Rate Range (THRR) Yes       Intervention Provide education and explanation of THRR including how the numbers were predicted and where they are located for reference       Expected Outcomes Short Term: Able to use daily as guideline for intensity in rehab;Short Term: Able to state/look up THRR;Long Term: Able to use THRR to govern intensity when exercising independently       Able to check pulse independently Yes       Intervention Provide education and  demonstration on how to check pulse in carotid and radial arteries.;Review the importance of being able to check your own pulse for safety during independent exercise       Expected Outcomes Short Term: Able to explain why pulse checking is important during independent exercise;Long Term: Able to  check pulse independently and accurately       Understanding of Exercise Prescription Yes       Intervention Provide education, explanation, and written materials on patient's individual exercise prescription       Expected Outcomes Short Term: Able to explain program exercise prescription;Long Term: Able to explain home exercise prescription to exercise independently                Exercise Goals Re-Evaluation :   Discharge Exercise Prescription (Final Exercise Prescription Changes):  Exercise Prescription Changes - 10/14/23 1500       Response to Exercise   Blood Pressure (Admit) 130/80    Blood Pressure (Exercise) 142/80    Blood Pressure (Exit) 132/80    Heart Rate (Admit) 73 bpm    Heart Rate (Exercise) 150 bpm    Heart Rate (Exit) 86 bpm    Oxygen Saturation (Admit) 92 %    Oxygen Saturation (Exercise) 86 %    Oxygen Saturation (Exit) 93 %    Rating of Perceived Exertion (Exercise) 13    Perceived Dyspnea (Exercise) 2    Duration Continue with 30 min of aerobic exercise without signs/symptoms of physical distress.    Intensity THRR unchanged      Progression   Progression Continue to progress workloads to maintain intensity without signs/symptoms of physical distress.             Nutrition:  Target Goals: Understanding of nutrition guidelines, daily intake of sodium 1500mg , cholesterol 200mg , calories 30% from fat and 7% or less from saturated fats, daily to have 5 or more servings of fruits and vegetables.  Biometrics:  Pre Biometrics - 10/14/23 1515       Pre Biometrics   Height 5\' 7"  (1.702 m)    Weight 105.7 kg    Waist Circumference 50 inches    Hip Circumference 41 inches    Waist to Hip Ratio 1.22 %    BMI (Calculated) 36.49    Grip Strength 26.7 kg    Single Leg Stand 4 seconds              Nutrition Therapy Plan and Nutrition Goals:   Nutrition Assessments:  Nutrition Assessments - 10/14/23 1517       Rate Your Plate Scores    Pre Score 0.89            MEDIFICTS Score Key: >=70 Need to make dietary changes  40-70 Heart Healthy Diet <= 40 Therapeutic Level Cholesterol Diet  Flowsheet Row PULMONARY REHAB COPD ORIENTATION from 10/14/2023 in [facility] CARDIAC REHABILITATION  Picture Your Plate Total Score on Admission 77      Picture Your Plate Scores: <40 Unhealthy dietary pattern with much room for improvement. 41-50 Dietary pattern unlikely to meet recommendations for good health and room for improvement. 51-60 More healthful dietary pattern, with some room for improvement.  >60 Healthy dietary pattern, although there may be some specific behaviors that could be improved.    Nutrition Goals Re-Evaluation:   Nutrition Goals Discharge (Final Nutrition Goals Re-Evaluation):   Psychosocial: Target Goals: Acknowledge presence or absence of significant depression and/or stress, maximize coping skills, provide positive support system. Participant is able  to verbalize types and ability to use techniques and skills needed for reducing stress and depression.  Initial Review & Psychosocial Screening:  Initial Psych Review & Screening - 10/14/23 1432       Initial Review   Current issues with Current Sleep Concerns   Wakes up once every night- falls back asleep within the hour.     Family Dynamics   Good Support System? Yes    Comments Husband Zella Ball is support system.      Barriers   Psychosocial barriers to participate in program There are no identifiable barriers or psychosocial needs.;The patient should benefit from training in stress management and relaxation.      Screening Interventions   Interventions Encouraged to exercise;Provide feedback about the scores to participant;To provide support and resources with identified psychosocial needs    Expected Outcomes Short Term goal: Utilizing psychosocial counselor, staff and physician to assist with identification of specific Stressors or current  issues interfering with healing process. Setting desired goal for each stressor or current issue identified.;Long Term Goal: Stressors or current issues are controlled or eliminated.;Short Term goal: Identification and review with participant of any Quality of Life or Depression concerns found by scoring the questionnaire.;Long Term goal: The participant improves quality of Life and PHQ9 Scores as seen by post scores and/or verbalization of changes             Quality of Life Scores:  Scores of 19 and below usually indicate a poorer quality of life in these areas.  A difference of  2-3 points is a clinically meaningful difference.  A difference of 2-3 points in the total score of the Quality of Life Index has been associated with significant improvement in overall quality of life, self-image, physical symptoms, and general health in studies assessing change in quality of life.   PHQ-9: Review Flowsheet  More data exists      10/12/2023 06/19/2023 06/09/2023 05/13/2023 03/10/2023  Depression screen PHQ 2/9  Decreased Interest 0 0 0 0 0  Down, Depressed, Hopeless 0 0 0 0 0  PHQ - 2 Score 0 0 0 0 0  Altered sleeping 1 0 0 - 0  Tired, decreased energy - 0 0 - 0  Change in appetite 0 0 0 - 0  Feeling bad or failure about yourself  0 0 0 - 0  Trouble concentrating 0 0 0 - 0  Moving slowly or fidgety/restless 0 0 0 - 0  Suicidal thoughts 0 0 0 - 0  PHQ-9 Score 1 0 0 - 0  Difficult doing work/chores Not difficult at all Not difficult at all Not difficult at all - -   Interpretation of Total Score  Total Score Depression Severity:  1-4 = Minimal depression, 5-9 = Mild depression, 10-14 = Moderate depression, 15-19 = Moderately severe depression, 20-27 = Severe depression   Psychosocial Evaluation and Intervention:  Psychosocial Evaluation - 10/14/23 1524       Psychosocial Evaluation & Interventions   Interventions Encouraged to exercise with the program and follow exercise  prescription;Relaxation education;Stress management education    Comments Franz was referred to this program for his diagnosis of COPD. Currently he gets short of breath with minimal activities, yet he wants to improve this to not be as short of breath as often. 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 He occasionally will get dizzy first thing in the mornings, but  it resolves shortly after sitting on the side of the bed. He denies any stressors in his life, his support system consists of his signifcant other, Robin. Elise is wanting to regain his strength and stamina and not be so short of breath as a result of this program. No barries identified to complete the program.    Continue Psychosocial Services  Follow up required by staff             Psychosocial Re-Evaluation:   Psychosocial Discharge (Final Psychosocial Re-Evaluation):    Education: Education Goals: Education classes will be provided on a weekly basis, covering required topics. Participant will state understanding/return demonstration of topics presented.  Learning Barriers/Preferences:  Learning Barriers/Preferences - 10/14/23 1435       Learning Barriers/Preferences   Learning Barriers None    Learning Preferences Written Material;Video;Individual Instruction;Group Instruction             Education Topics: How Lungs Work and Diseases: - Discuss the anatomy of the lungs and diseases that can affect the lungs, such as COPD.   Exercise: -Discuss the importance of exercise, FITT principles of exercise, normal and abnormal responses to exercise, and how to exercise safely.   Environmental Irritants: -Discuss types of environmental irritants and how to limit exposure to environmental irritants.   Meds/Inhalers and oxygen: - Discuss respiratory medications, definition of an inhaler and oxygen, and the proper way to use an inhaler and  oxygen.   Energy Saving Techniques: - Discuss methods to conserve energy and decrease shortness of breath when performing activities of daily living.    Bronchial Hygiene / Breathing Techniques: - Discuss breathing mechanics, pursed-lip breathing technique,  proper posture, effective ways to clear airways, and other functional breathing techniques   Cleaning Equipment: - Provides group verbal and written instruction about the health risks of elevated stress, cause of high stress, and healthy ways to reduce stress.   Nutrition I: Fats: - Discuss the types of cholesterol, what cholesterol does to the body, and how cholesterol levels can be controlled.   Nutrition II: Labels: -Discuss the different components of food labels and how to read food labels.   Respiratory Infections: - Discuss the signs and symptoms of respiratory infections, ways to prevent respiratory infections, and the importance of seeking medical treatment when having a respiratory infection.   Stress I: Signs and Symptoms: - Discuss the causes of stress, how stress may lead to anxiety and depression, and ways to limit stress.   Stress II: Relaxation: -Discuss relaxation techniques to limit stress.   Oxygen for Home/Travel: - Discuss how to prepare for travel when on oxygen and proper ways to transport and store oxygen to ensure safety.   Knowledge Questionnaire Score:   Core Components/Risk Factors/Patient Goals at Admission:  Personal Goals and Risk Factors at Admission - 10/14/23 1519       Core Components/Risk Factors/Patient Goals on Admission    Weight Management Yes    Intervention Weight Management: Develop a combined nutrition and exercise program designed to reach desired caloric intake, while maintaining appropriate intake of nutrient and fiber, sodium and fats, and appropriate energy expenditure required for the weight goal.;Weight Management: Provide education and appropriate resources to  help participant work on and attain dietary goals.;Weight Management/Obesity: Establish reasonable short term and long term weight goals.    Admit Weight 233 lb 0.4 oz (105.7 kg)    Improve shortness of breath with ADL's Yes    Intervention Provide education, individualized exercise plan and daily  activity instruction to help decrease symptoms of SOB with activities of daily living.    Expected Outcomes Short Term: Improve cardiorespiratory fitness to achieve a reduction of symptoms when performing ADLs    Increase knowledge of respiratory medications and ability to use respiratory devices properly  Yes    Intervention Provide education and demonstration as needed of appropriate use of medications, inhalers, and oxygen therapy.    Expected Outcomes Short Term: Achieves understanding of medications use. Understands that oxygen is a medication prescribed by physician. Demonstrates appropriate use of inhaler and oxygen therapy.;Long Term: Maintain appropriate use of medications, inhalers, and oxygen therapy.    Diabetes Yes    Intervention Provide education about signs/symptoms and action to take for hypo/hyperglycemia.;Provide education about proper nutrition, including hydration, and aerobic/resistive exercise prescription along with prescribed medications to achieve blood glucose in normal ranges: Fasting glucose 65-99 mg/dL    Expected Outcomes Short Term: Participant verbalizes understanding of the signs/symptoms and immediate care of hyper/hypoglycemia, proper foot care and importance of medication, aerobic/resistive exercise and nutrition plan for blood glucose control.;Long Term: Attainment of HbA1C < 7%.    Hypertension Yes    Intervention Monitor prescription use compliance.;Provide education on lifestyle modifcations including regular physical activity/exercise, weight management, moderate sodium restriction and increased consumption of fresh fruit, vegetables, and low fat dairy, alcohol  moderation, and smoking cessation.    Expected Outcomes Long Term: Maintenance of blood pressure at goal levels.;Short Term: Continued assessment and intervention until BP is < 140/77mm HG in hypertensive participants. < 130/9mm HG in hypertensive participants with diabetes, heart failure or chronic kidney disease.    Lipids Yes    Intervention Provide education and support for participant on nutrition & aerobic/resistive exercise along with prescribed medications to achieve LDL 70mg , HDL >40mg .    Expected Outcomes Short Term: Participant states understanding of desired cholesterol values and is compliant with medications prescribed. Participant is following exercise prescription and nutrition guidelines.;Long Term: Cholesterol controlled with medications as prescribed, with individualized exercise RX and with personalized nutrition plan. Value goals: LDL < 70mg , HDL > 40 mg.             Core Components/Risk Factors/Patient Goals Review:    Core Components/Risk Factors/Patient Goals at Discharge (Final Review):    ITP Comments: Patient attend orientation today.  Patient is attending Pulmonary Rehabilitation Program.  Documentation for diagnosis can be found in [facility].  Reviewed medical chart, RPE/RPD, gym safety, and program guidelines.  Patient was fitted to equipment they will be using during rehab.  Patient is scheduled to start exercise on 10/15/23.   Initial ITP created and sent for review and signature by Dr. Erick Blinks, Medical Director for Pulmonary Rehabilitation Program.   Comments: Initial ITP.

## 2023-10-14 NOTE — Patient Instructions (Signed)
 Patient Instructions  Patient Details  Name: Jerome Sanchez MRN: [id number] Date of Birth: Jan 01, 1951 Referring Provider:  Nyoka Cowden, MD  Below are your personal goals for exercise, nutrition, and risk factors. Our goal is to help you stay on track towards obtaining and maintaining these goals. We will be discussing your progress on these goals with you throughout the program.  Initial Exercise Prescription:  Initial Exercise Prescription - 10/14/23 1500       Date of Initial Exercise RX and Referring Provider   Date 10/14/23    Referring Provider Sandrea Hughs MD      Oxygen   Maintain Oxygen Saturation 88% or higher      Treadmill   MPH 1.5    Grade 0    Minutes 15    METs 1.9      REL-XR   Level 1    Speed 50    Minutes 15    METs 1.8      Prescription Details   Frequency (times per week) 2    Duration Progress to 30 minutes of continuous aerobic without signs/symptoms of physical distress      Intensity   THRR 40-80% of Max Heartrate 102/131    Ratings of Perceived Exertion 11-13    Perceived Dyspnea 0-4      Resistance Training   Training Prescription Yes    Weight 4    Reps 10-15             Exercise Goals: Frequency: Be able to perform aerobic exercise two to three times per week in program working toward 2-5 days per week of home exercise.  Intensity: Work with a perceived exertion of 11 (fairly light) - 15 (hard) while following your exercise prescription.  We will make changes to your prescription with you as you progress through the program.   Duration: Be able to do 30 to 45 minutes of continuous aerobic exercise in addition to a 5 minute warm-up and a 5 minute cool-down routine.   Nutrition Goals: Your personal nutrition goals will be established when you do your nutrition analysis with the dietician.  The following are general nutrition guidelines to follow: Cholesterol < 200mg /day Sodium < 1500mg /day Fiber: Men over 50 yrs - 30  grams per day  Personal Goals:  Personal Goals and Risk Factors at Admission - 10/14/23 1519       Core Components/Risk Factors/Patient Goals on Admission    Weight Management Yes    Intervention Weight Management: Develop a combined nutrition and exercise program designed to reach desired caloric intake, while maintaining appropriate intake of nutrient and fiber, sodium and fats, and appropriate energy expenditure required for the weight goal.;Weight Management: Provide education and appropriate resources to help participant work on and attain dietary goals.;Weight Management/Obesity: Establish reasonable short term and long term weight goals.    Admit Weight 233 lb 0.4 oz (105.7 kg)    Improve shortness of breath with ADL's Yes    Intervention Provide education, individualized exercise plan and daily activity instruction to help decrease symptoms of SOB with activities of daily living.    Expected Outcomes Short Term: Improve cardiorespiratory fitness to achieve a reduction of symptoms when performing ADLs    Increase knowledge of respiratory medications and ability to use respiratory devices properly  Yes    Intervention Provide education and demonstration as needed of appropriate use of medications, inhalers, and oxygen therapy.    Expected Outcomes Short Term: Achieves understanding of medications use.  Understands that oxygen is a medication prescribed by physician. Demonstrates appropriate use of inhaler and oxygen therapy.;Long Term: Maintain appropriate use of medications, inhalers, and oxygen therapy.    Diabetes Yes    Intervention Provide education about signs/symptoms and action to take for hypo/hyperglycemia.;Provide education about proper nutrition, including hydration, and aerobic/resistive exercise prescription along with prescribed medications to achieve blood glucose in normal ranges: Fasting glucose 65-99 mg/dL    Expected Outcomes Short Term: Participant verbalizes understanding  of the signs/symptoms and immediate care of hyper/hypoglycemia, proper foot care and importance of medication, aerobic/resistive exercise and nutrition plan for blood glucose control.;Long Term: Attainment of HbA1C < 7%.    Hypertension Yes    Intervention Monitor prescription use compliance.;Provide education on lifestyle modifcations including regular physical activity/exercise, weight management, moderate sodium restriction and increased consumption of fresh fruit, vegetables, and low fat dairy, alcohol moderation, and smoking cessation.    Expected Outcomes Long Term: Maintenance of blood pressure at goal levels.;Short Term: Continued assessment and intervention until BP is < 140/16mm HG in hypertensive participants. < 130/78mm HG in hypertensive participants with diabetes, heart failure or chronic kidney disease.    Lipids Yes    Intervention Provide education and support for participant on nutrition & aerobic/resistive exercise along with prescribed medications to achieve LDL 70mg , HDL >40mg .    Expected Outcomes Short Term: Participant states understanding of desired cholesterol values and is compliant with medications prescribed. Participant is following exercise prescription and nutrition guidelines.;Long Term: Cholesterol controlled with medications as prescribed, with individualized exercise RX and with personalized nutrition plan. Value goals: LDL < 70mg , HDL > 40 mg.             Tobacco Use Initial Evaluation: Social History   Tobacco Use  Smoking Status Former   Current packs/day: 0.00   Average packs/day: 1.3 packs/day for 51.0 years (63.8 ttl pk-yrs)   Types: Cigarettes   Start date: 36   Quit date: 2018   Years since quitting: 7.1  Smokeless Tobacco Never    Exercise Goals and Review:  Exercise Goals     Row Name 10/14/23 1514             Exercise Goals   Increase Physical Activity Yes       Intervention Provide advice, education, support and counseling  about physical activity/exercise needs.;Develop an individualized exercise prescription for aerobic and resistive training based on initial evaluation findings, risk stratification, comorbidities and participant's personal goals.       Expected Outcomes Short Term: Attend rehab on a regular basis to increase amount of physical activity.;Long Term: Add in home exercise to make exercise part of routine and to increase amount of physical activity.;Long Term: Exercising regularly at least 3-5 days a week.       Increase Strength and Stamina Yes       Intervention Provide advice, education, support and counseling about physical activity/exercise needs.;Develop an individualized exercise prescription for aerobic and resistive training based on initial evaluation findings, risk stratification, comorbidities and participant's personal goals.       Expected Outcomes Short Term: Increase workloads from initial exercise prescription for resistance, speed, and METs.;Short Term: Perform resistance training exercises routinely during rehab and add in resistance training at home;Long Term: Improve cardiorespiratory fitness, muscular endurance and strength as measured by increased METs and functional capacity ( )       Able to understand and use rate of perceived exertion (RPE) scale Yes       Intervention Provide  education and explanation on how to use RPE scale       Expected Outcomes Short Term: Able to use RPE daily in rehab to express subjective intensity level;Long Term:  Able to use RPE to guide intensity level when exercising independently       Able to understand and use Dyspnea scale Yes       Intervention Provide education and explanation on how to use Dyspnea scale       Expected Outcomes Short Term: Able to use Dyspnea scale daily in rehab to express subjective sense of shortness of breath during exertion;Long Term: Able to use Dyspnea scale to guide intensity level when exercising independently        Knowledge and understanding of Target Heart Rate Range (THRR) Yes       Intervention Provide education and explanation of THRR including how the numbers were predicted and where they are located for reference       Expected Outcomes Short Term: Able to use daily as guideline for intensity in rehab;Short Term: Able to state/look up THRR;Long Term: Able to use THRR to govern intensity when exercising independently       Able to check pulse independently Yes       Intervention Provide education and demonstration on how to check pulse in carotid and radial arteries.;Review the importance of being able to check your own pulse for safety during independent exercise       Expected Outcomes Short Term: Able to explain why pulse checking is important during independent exercise;Long Term: Able to check pulse independently and accurately       Understanding of Exercise Prescription Yes       Intervention Provide education, explanation, and written materials on patient's individual exercise prescription       Expected Outcomes Short Term: Able to explain program exercise prescription;Long Term: Able to explain home exercise prescription to exercise independently                Copy of goals given to participant.

## 2023-10-15 ENCOUNTER — Encounter (HOSPITAL_COMMUNITY)
Admission: RE | Admit: 2023-10-15 | Discharge: 2023-10-15 | Discharge status: Home / Self Care | Payer: PPO | Source: Ambulatory Visit | Attending: Internal Medicine | Admitting: Internal Medicine

## 2023-10-15 DIAGNOSIS — J449 Chronic obstructive pulmonary disease, unspecified: Secondary | ICD-10-CM | POA: Diagnosis not present

## 2023-10-15 NOTE — Progress Notes (Signed)
 Daily Session Note  Patient Details  Name: Jerome Sanchez MRN: [id number] Date of Birth: 1951/02/28 Referring Provider:   Flowsheet Row PULMONARY REHAB COPD ORIENTATION from 10/14/2023 in [facility] CARD[facility]AC REHABILITATION  Referring Provider Sandrea Hughs MD       Encounter Date: 10/15/2023  Check In:  Session Check In - 10/15/23 1315       Check-In   Supervising physician immediately available to respond to emergencies See telemetry face sheet for immediately available MD    Location [facility]    Staff Present Avanell Shackleton BSN, RN;Debra Laural Benes, RN, BSN    Virtual Visit No    Medication changes reported     No    Fall or balance concerns reported    No    Tobacco Cessation No Change    Warm-up and Cool-down Performed on first and last piece of equipment    Resistance Training Performed Yes    VAD Patient? No    PAD/SET Patient? No      Pain Assessment   Currently in Pain? No/denies    Multiple Pain Sites No             Capillary Blood Glucose: No results found for this or any previous visit (from the past 24 hours).    Social History   Tobacco Use  Smoking Status Former   Current packs/day: 0.00   Average packs/day: 1.3 packs/day for 51.0 years (63.8 ttl pk-yrs)   Types: Cigarettes   Start date: 33   Quit date: 2018   Years since quitting: 7.1  Smokeless Tobacco Never    Goals Met:  Proper associated with RPD/PD & O2 Sat Independence with exercise equipment Using PLB without cueing & demonstrates good technique Exercise tolerated well Queuing for purse lip breathing No report of concerns or symptoms today Strength training completed today  Goals Unmet:  Not Applicable  Comments: Marland KitchenMarland KitchenFirst full day of exercise!  Patient was oriented to gym and equipment including functions, settings, policies, and procedures.  Patient's individual exercise prescription and treatment plan were reviewed.  All starting workloads were established  based on the results of the 6 minute walk test done at initial orientation visit.  The plan for exercise progression was also introduced and progression will be customized based on patient's performance and goals.

## 2023-10-19 DIAGNOSIS — E1122 Type 2 diabetes mellitus with diabetic chronic kidney disease: Secondary | ICD-10-CM | POA: Diagnosis not present

## 2023-10-19 DIAGNOSIS — E1129 Type 2 diabetes mellitus with other diabetic kidney complication: Secondary | ICD-10-CM | POA: Diagnosis not present

## 2023-10-19 DIAGNOSIS — I129 Hypertensive chronic kidney disease with stage 1 through stage 4 chronic kidney disease, or unspecified chronic kidney disease: Secondary | ICD-10-CM | POA: Diagnosis not present

## 2023-10-19 DIAGNOSIS — R809 Proteinuria, unspecified: Secondary | ICD-10-CM | POA: Diagnosis not present

## 2023-10-20 ENCOUNTER — Encounter (HOSPITAL_COMMUNITY)
Admission: RE | Admit: 2023-10-20 | Discharge: 2023-10-20 | Disposition: A | Discharge status: Home / Self Care | Payer: PPO | Source: Ambulatory Visit | Attending: Internal Medicine | Admitting: Internal Medicine

## 2023-10-20 DIAGNOSIS — J449 Chronic obstructive pulmonary disease, unspecified: Secondary | ICD-10-CM | POA: Insufficient documentation

## 2023-10-20 NOTE — Progress Notes (Signed)
 Daily Session Note  Patient Details  Name: Jerome Sanchez MRN: [id number] Date of Birth: February 04, 1951 Referring Provider:   Flowsheet Row PULMONARY REHAB COPD ORIENTATION from 10/14/2023 in [facility] CARDIAC REHABILITATION  Referring Provider Sandrea Hughs MD       Encounter Date: 10/20/2023  Check In:  Session Check In - 10/20/23 1330       Check-In   Supervising physician immediately available to respond to emergencies See telemetry face sheet for immediately available MD    Location [facility]    Staff Present Ross Ludwig, BS, Exercise Physiologist;Audreana Hancox Roseanne Reno, BSN, RN, WTA-C    Virtual Visit No    Medication changes reported     No    Fall or balance concerns reported    No    Tobacco Cessation No Change    Warm-up and Cool-down Performed on first and last piece of equipment    Resistance Training Performed Yes    VAD Patient? No    PAD/SET Patient? No      Pain Assessment   Currently in Pain? No/denies    Multiple Pain Sites No             Capillary Blood Glucose: No results found for this or any previous visit (from the past 24 hours).    Social History   Tobacco Use  Smoking Status Former   Current packs/day: 0.00   Average packs/day: 1.3 packs/day for 51.0 years (63.8 ttl pk-yrs)   Types: Cigarettes   Start date: 30   Quit date: 2018   Years since quitting: 7.1  Smokeless Tobacco Never    Goals Met:  Proper associated with RPD/PD & O2 Sat Independence with exercise equipment Exercise tolerated well No report of concerns or symptoms today Strength training completed today  Goals Unmet:  Not Applicable  Comments: Pt able to follow exercise prescription today without complaint.  Will continue to monitor for progression.

## 2023-10-22 ENCOUNTER — Encounter (HOSPITAL_COMMUNITY)
Admission: RE | Admit: 2023-10-22 | Discharge: 2023-10-22 | Disposition: A | Discharge status: Home / Self Care | Payer: PPO | Source: Ambulatory Visit | Attending: Internal Medicine | Admitting: Internal Medicine

## 2023-10-22 DIAGNOSIS — J449 Chronic obstructive pulmonary disease, unspecified: Secondary | ICD-10-CM

## 2023-10-22 NOTE — Progress Notes (Signed)
 Daily Session Note  Patient Details  Name: Jerome Sanchez MRN: [id number] Date of Birth: 11/20/1950 Referring Provider:   Flowsheet Row PULMONARY REHAB COPD ORIENTATION from 10/14/2023 in [facility] CARDIAC REHABILITATION  Referring Provider Sandrea Hughs MD       Encounter Date: 10/22/2023  Check In:  Session Check In - 10/22/23 1341       Check-In   Supervising physician immediately available to respond to emergencies See telemetry face sheet for immediately available MD    Location [facility]    Staff Present Ross Ludwig, BS, Exercise Physiologist;Petronella Shuford Juanetta Gosling, MA, RCEP, CCRP, CCET;Hillary Troutman BSN, RN    Virtual Visit No    Medication changes reported     No    Fall or balance concerns reported    No    Warm-up and Cool-down Performed on first and last piece of equipment    Resistance Training Performed Yes    VAD Patient? No    PAD/SET Patient? No      Pain Assessment   Currently in Pain? No/denies             Capillary Blood Glucose: No results found for this or any previous visit (from the past 24 hours).    Social History   Tobacco Use  Smoking Status Former   Current packs/day: 0.00   Average packs/day: 1.3 packs/day for 51.0 years (63.8 ttl pk-yrs)   Types: Cigarettes   Start date: 63   Quit date: 2018   Years since quitting: 7.1  Smokeless Tobacco Never    Goals Met:  Proper associated with RPD/PD & O2 Sat Independence with exercise equipment Using PLB without cueing & demonstrates good technique Exercise tolerated well No report of concerns or symptoms today Strength training completed today  Goals Unmet:  Not Applicable  Comments: Pt able to follow exercise prescription today without complaint.  Will continue to monitor for progression.

## 2023-10-27 ENCOUNTER — Encounter (HOSPITAL_COMMUNITY)
Admission: RE | Admit: 2023-10-27 | Discharge: 2023-10-27 | Disposition: A | Discharge status: Home / Self Care | Payer: PPO | Source: Ambulatory Visit | Attending: Internal Medicine | Admitting: Internal Medicine

## 2023-10-27 DIAGNOSIS — J449 Chronic obstructive pulmonary disease, unspecified: Secondary | ICD-10-CM | POA: Diagnosis not present

## 2023-10-27 NOTE — Progress Notes (Signed)
 Daily Session Note  Patient Details  Name: Jerome Sanchez MRN: [id number] Date of Birth: Feb 08, 1951 Referring Provider:   Flowsheet Row PULMONARY REHAB COPD ORIENTATION from 10/14/2023 in [facility] CARDIAC REHABILITATION  Referring Provider Sandrea Hughs MD       Encounter Date: 10/27/2023  Check In:  Session Check In - 10/27/23 1330       Check-In   Supervising physician immediately available to respond to emergencies See telemetry face sheet for immediately available MD    Location [facility]    Staff Present Ross Ludwig, BS, Exercise Physiologist;Brittany Roseanne Reno, BSN, RN, WTA-C;Hildagarde Holleran, RN;Jessica Corydon, MA, RCEP, CCRP, CCET    Virtual Visit No    Medication changes reported     No    Fall or balance concerns reported    No    Warm-up and Cool-down Performed on first and last piece of equipment    Resistance Training Performed Yes    VAD Patient? No    PAD/SET Patient? No      Pain Assessment   Currently in Pain? No/denies    Multiple Pain Sites No             Capillary Blood Glucose: No results found for this or any previous visit (from the past 24 hours).    Social History   Tobacco Use  Smoking Status Former   Current packs/day: 0.00   Average packs/day: 1.3 packs/day for 51.0 years (63.8 ttl pk-yrs)   Types: Cigarettes   Start date: 27   Quit date: 2018   Years since quitting: 7.1  Smokeless Tobacco Never    Goals Met:  Proper associated with RPD/PD & O2 Sat Independence with exercise equipment Improved SOB with ADL's Exercise tolerated well No report of concerns or symptoms today Strength training completed today  Goals Unmet:  Not Applicable  Comments: Pt able to follow exercise prescription today without complaint.  Will continue to monitor for progression.

## 2023-10-29 ENCOUNTER — Encounter (HOSPITAL_COMMUNITY)
Admission: RE | Admit: 2023-10-29 | Discharge: 2023-10-29 | Disposition: A | Discharge status: Home / Self Care | Payer: PPO | Source: Ambulatory Visit | Attending: Internal Medicine | Admitting: Internal Medicine

## 2023-10-29 DIAGNOSIS — J449 Chronic obstructive pulmonary disease, unspecified: Secondary | ICD-10-CM

## 2023-10-29 NOTE — Progress Notes (Signed)
 Daily Session Note  Patient Details  Name: Jerome Sanchez MRN: [id number] Date of Birth: September 18, 1950 Referring Provider:   Flowsheet Row PULMONARY REHAB COPD ORIENTATION from 10/14/2023 in [facility] CARDIAC REHABILITATION  Referring Provider Sandrea Hughs MD       Encounter Date: 10/29/2023  Check In:  Session Check In - 10/29/23 1331       Check-In   Supervising physician immediately available to respond to emergencies See telemetry face sheet for immediately available MD    Location [facility]    Staff Present Ross Ludwig, BS, Exercise Physiologist;Hillary Cochrane BSN, RN;Sherrel Ploch [place], MA, RCEP, CCRP, CCET    Virtual Visit No    Medication changes reported     No    Fall or balance concerns reported    No    Warm-up and Cool-down Performed on first and last piece of equipment    Resistance Training Performed Yes    VAD Patient? No    PAD/SET Patient? No      Pain Assessment   Currently in Pain? No/denies             Capillary Blood Glucose: No results found for this or any previous visit (from the past 24 hours).    Social History   Tobacco Use  Smoking Status Former   Current packs/day: 0.00   Average packs/day: 1.3 packs/day for 51.0 years (63.8 ttl pk-yrs)   Types: Cigarettes   Start date: 43   Quit date: 2018   Years since quitting: 7.2  Smokeless Tobacco Never    Goals Met:  Proper associated with RPD/PD & O2 Sat Independence with exercise equipment Using PLB without cueing & demonstrates good technique Exercise tolerated well No report of concerns or symptoms today Strength training completed today  Goals Unmet:  Not Applicable  Comments: Pt able to follow exercise prescription today without complaint.  Will continue to monitor for progression.

## 2023-11-03 ENCOUNTER — Encounter (HOSPITAL_COMMUNITY)
Admission: RE | Admit: 2023-11-03 | Discharge: 2023-11-03 | Disposition: A | Discharge status: Home / Self Care | Payer: PPO | Source: Ambulatory Visit | Attending: Internal Medicine | Admitting: Internal Medicine

## 2023-11-03 DIAGNOSIS — J449 Chronic obstructive pulmonary disease, unspecified: Secondary | ICD-10-CM

## 2023-11-03 NOTE — Progress Notes (Signed)
 Daily Session Note  Patient Details  Name: Jerome Sanchez MRN: [id number] Date of Birth: 1950-11-20 Referring Provider:   Flowsheet Row PULMONARY REHAB COPD ORIENTATION from 10/14/2023 in [facility] CARDIAC REHABILITATION  Referring Provider Sandrea Hughs MD       Encounter Date: 11/03/2023  Check In:  Session Check In - 11/03/23 1330       Check-In   Supervising physician immediately available to respond to emergencies See telemetry face sheet for immediately available MD    Location [facility]    Staff Present Ross Ludwig, BS, Exercise Physiologist;Brittany Roseanne Reno, BSN, RN, WTA-C;Anai Lipson, RN;Jessica Palmer, MA, RCEP, CCRP, CCET    Virtual Visit No    Medication changes reported     No    Fall or balance concerns reported    No    Warm-up and Cool-down Performed on first and last piece of equipment    Resistance Training Performed Yes    VAD Patient? No    PAD/SET Patient? No      Pain Assessment   Currently in Pain? No/denies    Multiple Pain Sites No             Capillary Blood Glucose: No results found for this or any previous visit (from the past 24 hours).    Social History   Tobacco Use  Smoking Status Former   Current packs/day: 0.00   Average packs/day: 1.3 packs/day for 51.0 years (63.8 ttl pk-yrs)   Types: Cigarettes   Start date: 46   Quit date: 2018   Years since quitting: 7.2  Smokeless Tobacco Never    Goals Met:  Proper associated with RPD/PD & O2 Sat Independence with exercise equipment Using PLB without cueing & demonstrates good technique Exercise tolerated well No report of concerns or symptoms today Strength training completed today  Goals Unmet:  Not Applicable  Comments: Pt able to follow exercise prescription today without complaint.  Will continue to monitor for progression.

## 2023-11-05 ENCOUNTER — Encounter (HOSPITAL_COMMUNITY)
Admission: RE | Admit: 2023-11-05 | Discharge: 2023-11-05 | Disposition: A | Discharge status: Home / Self Care | Payer: PPO | Source: Ambulatory Visit | Attending: Internal Medicine | Admitting: Internal Medicine

## 2023-11-05 DIAGNOSIS — J449 Chronic obstructive pulmonary disease, unspecified: Secondary | ICD-10-CM

## 2023-11-05 NOTE — Progress Notes (Signed)
 Daily Session Note  Patient Details  Name: Jerome Sanchez MRN: [id number] Date of Birth: [birth year] Referring Provider:   Flowsheet Row PULMONARY REHAB COPD ORIENTATION from 10/14/2023 in [facility] CARDIAC REHABILITATION  Referring Provider Sandrea Hughs MD       Encounter Date: 11/05/2023  Check In:  Session Check In - 11/05/23 1315       Check-In   Supervising physician immediately available to respond to emergencies See telemetry face sheet for immediately available MD    Location [facility]    Staff Present Avanell Shackleton BSN, RN;Heather Fredric Mare, [place], Exercise Physiologist;Debra Laural Benes, RN, BSN;Jessica Flagler, MA, RCEP, CCRP, CCET    Virtual Visit No    Medication changes reported     No    Fall or balance concerns reported    No    Tobacco Cessation No Change    Warm-up and Cool-down Performed on first and last piece of equipment    Resistance Training Performed Yes    VAD Patient? No    PAD/SET Patient? No      Pain Assessment   Currently in Pain? No/denies    Multiple Pain Sites No             Capillary Blood Glucose: No results found for this or any previous visit (from the past 24 hours).    Social History   Tobacco Use  Smoking Status Former   Current packs/day: 0.00   Average packs/day: 1.3 packs/day for 51.0 years (63.8 ttl pk-yrs)   Types: Cigarettes   Start date: 55   Quit date: 2018   Years since quitting: 7.2  Smokeless Tobacco Never    Goals Met:  Proper associated with RPD/PD & O2 Sat Independence with exercise equipment Using PLB without cueing & demonstrates good technique Exercise tolerated well Queuing for purse lip breathing No report of concerns or symptoms today Strength training completed today  Goals Unmet:  Not Applicable  Comments: Marland KitchenMarland KitchenPt able to follow exercise prescription today without complaint.  Will continue to monitor for progression.

## 2023-11-06 DIAGNOSIS — G4733 Obstructive sleep apnea (adult) (pediatric): Secondary | ICD-10-CM | POA: Diagnosis not present

## 2023-11-10 ENCOUNTER — Encounter (HOSPITAL_COMMUNITY)
Admission: RE | Admit: 2023-11-10 | Discharge: 2023-11-10 | Disposition: A | Discharge status: Home / Self Care | Payer: PPO | Source: Ambulatory Visit | Attending: Internal Medicine | Admitting: Internal Medicine

## 2023-11-10 DIAGNOSIS — J449 Chronic obstructive pulmonary disease, unspecified: Secondary | ICD-10-CM

## 2023-11-10 NOTE — Progress Notes (Signed)
 Daily Session Note  Patient Details  Name: Jerome Sanchez MRN: [id number] Date of Birth: 06-16-1951 Referring Provider:   Flowsheet Row PULMONARY REHAB COPD ORIENTATION from 10/14/2023 in [facility] CARDIAC REHABILITATION  Referring Provider Sandrea Hughs MD       Encounter Date: 11/10/2023  Check In:  Session Check In - 11/10/23 1404       Check-In   Supervising physician immediately available to respond to emergencies See telemetry face sheet for immediately available MD    Location [facility]    Staff Present Fabio Pierce, MA, RCEP, CCRP, CCET;Heather Fredric Mare, [place], Exercise Physiologist;Phyllis Billingsley, RN;Brittany Roseanne Reno, BSN, RN, WTA-C    Virtual Visit No    Medication changes reported     No    Fall or balance concerns reported    No    Warm-up and Cool-down Performed on first and last piece of equipment    Resistance Training Performed Yes    VAD Patient? No    PAD/SET Patient? No      Pain Assessment   Currently in Pain? No/denies             Capillary Blood Glucose: No results found for this or any previous visit (from the past 24 hours).    Social History   Tobacco Use  Smoking Status Former   Current packs/day: 0.00   Average packs/day: 1.3 packs/day for 51.0 years (63.8 ttl pk-yrs)   Types: Cigarettes   Start date: 63   Quit date: 2018   Years since quitting: 7.2  Smokeless Tobacco Never    Goals Met:  Proper associated with RPD/PD & O2 Sat Independence with exercise equipment Using PLB without cueing & demonstrates good technique Exercise tolerated well No report of concerns or symptoms today Strength training completed today  Goals Unmet:  Not Applicable  Comments: Pt able to follow exercise prescription today without complaint.  Will continue to monitor for progression.

## 2023-11-11 ENCOUNTER — Encounter (HOSPITAL_COMMUNITY): Payer: Self-pay | Admitting: *Deleted

## 2023-11-11 DIAGNOSIS — J449 Chronic obstructive pulmonary disease, unspecified: Secondary | ICD-10-CM

## 2023-11-11 NOTE — Progress Notes (Signed)
 Pulmonary Individual Treatment Plan  Patient Details  Name: TYSON PARKISON MRN: [id number] Date of Birth: 06/26/1951 Referring Provider:   Flowsheet Row PULMONARY REHAB COPD ORIENTATION from 10/14/2023 in [facility] CARDIAC REHABILITATION  Referring Provider Sandrea Hughs MD       Initial Encounter Date:  Flowsheet Row PULMONARY REHAB COPD ORIENTATION from 10/14/2023 in [facility] CARDIAC REHABILITATION  Date 10/14/23       Visit Diagnosis: Chronic obstructive pulmonary disease, unspecified COPD type (HCC)  Patient's Home Medications on Admission:   Current Outpatient Medications:    albuterol (VENTOLIN HFA) 108 (90 Base) MCG/ACT inhaler, INHALE 1 TO 2 PUFFS BY MOUTH EVERY 6 HOURS AS NEEDED FOR WHEEZING FOR SHORTNESS OF BREATH, Disp: 9 g, Rfl: 0   atorvastatin (LIPITOR) 80 MG tablet, Take 1 tablet (80 mg total) by mouth daily., Disp: 100 tablet, Rfl: 2   dapagliflozin propanediol (FARXIGA) 5 MG TABS tablet, Take 5 mg by mouth daily., Disp: , Rfl:    ezetimibe (ZETIA) 10 MG tablet, Take 1 tablet (10 mg total) by mouth daily., Disp: 100 tablet, Rfl: 1   Fluticasone-Umeclidin-Vilant (TRELEGY ELLIPTA) 100-62.5-25 MCG/ACT AEPB, INHALE 1 PUFF INTO LUNGS ONCE DAILY, Disp: 180 each, Rfl: 6   furosemide (LASIX) 40 MG tablet, Take 1 tablet (40 mg total) by mouth daily., Disp: 90 tablet, Rfl: 3   lansoprazole (PREVACID) 15 MG capsule, Take 1 capsule (15 mg total) by mouth as needed., Disp: 100 capsule, Rfl: 1   OVER THE COUNTER MEDICATION, Areds ( omega 3, lutein, zeaxanthin) take one a day, Disp: , Rfl:    valsartan (DIOVAN) 80 MG tablet, Take 1 tablet (80 mg total) by mouth daily., Disp: 100 tablet, Rfl: 3  Past Medical History: Past Medical History:  Diagnosis Date   Allergy    Arthritis    Asthma    Cardiac arrhythmia    Chronic kidney disease    COPD (chronic obstructive pulmonary disease) (HCC)    Essential hypertension, benign    GERD (gastroesophageal reflux disease)     Impaired glucose tolerance    Injury of right rotator cuff    Lumbar disc disease    Mixed hyperlipidemia    OSA (obstructive sleep apnea)    Sleep apnea    Type 2 diabetes mellitus (HCC)    Vitamin D deficiency     Tobacco Use: Social History   Tobacco Use  Smoking Status Former   Current packs/day: 0.00   Average packs/day: 1.3 packs/day for 51.0 years (63.8 ttl pk-yrs)   Types: Cigarettes   Start date: 14   Quit date: 2018   Years since quitting: 7.2  Smokeless Tobacco Never    Labs: Review Flowsheet  More data exists      Latest Ref Rng & Units 08/22/2021 03/05/2022 07/15/2022 06/22/2023 10/02/2023  Labs for ITP Cardiac and Pulmonary Rehab  Cholestrol 100 - 199 mg/dL 213  086  578  469  629   LDL (calc) 0 - 99 mg/dL 528  413  74  67  71   HDL-C >39 mg/dL 79  84  94  79  85   Trlycerides 0 - 149 mg/dL 244  76  81  62  72   Hemoglobin A1c 4.8 - 5.6 % 6.1  6.0  - 6.6  6.7     Capillary Blood Glucose: No results found for: "GLUCAP"   Pulmonary Assessment Scores:  Pulmonary Assessment Scores     Row Name 10/14/23 [phone]  ADL UCSD   ADL Phase Entry     SOB Score total 33     Rest 0     Walk 2     Stairs 3     Bath 1     Dress 0     Shop 1       CAT Score   CAT Score 14             UCSD: Self-administered rating of dyspnea associated with activities of daily living (ADLs) 6-point scale (0 = "not at all" to 5 = "maximal or unable to do because of breathlessness")  Scoring Scores range from 0 to 120.  Minimally important difference is 5 units  CAT: CAT can identify the health impairment of COPD patients and is better correlated with disease progression.  CAT has a scoring range of zero to 40. The CAT score is classified into four groups of low (less than 10), medium (10 - 20), high (21-30) and very high (31-40) based on the impact level of disease on health status. A CAT score over 10 suggests significant symptoms.  A worsening CAT score could be  explained by an exacerbation, poor medication adherence, poor inhaler technique, or progression of COPD or comorbid conditions.  CAT MCID is 2 points  mMRC: mMRC (Modified Medical Research Council) Dyspnea Scale is used to assess the degree of baseline functional disability in patients of respiratory disease due to dyspnea. No minimal important difference is established. A decrease in score of 1 point or greater is considered a positive change.   Pulmonary Function Assessment:   Exercise Target Goals: Exercise Program Goal: Individual exercise prescription set using results from initial 6 min walk test and THRR while considering  patient's activity barriers and safety.   Exercise Prescription Goal: Initial exercise prescription builds to 30-45 minutes a day of aerobic activity, 2-3 days per week.  Home exercise guidelines will be given to patient during program as part of exercise prescription that the participant will acknowledge.  Activity Barriers & Risk Stratification:  Activity Barriers & Cardiac Risk Stratification - 10/14/23 1426       Activity Barriers & Cardiac Risk Stratification   Activity Barriers Arthritis;Back Problems;Balance Concerns;Shortness of Breath   Arthritis in both shoulders, Back pain due to a crushed disc.            6 Minute Walk:  6 Minute Walk     Row Name 10/14/23 1510 10/14/23 1511       6 Minute Walk   Phase Initial --    Distance 1200 feet --    Walk Time 6 minutes --    # of Rest Breaks 0 --    MPH 2.27 --    METS 2.6 --    RPE 13 --    Perceived Dyspnea  2 --    VO2 Peak 9.1 --    Symptoms No --    Resting HR 73 bpm --    Resting BP 130/80 --    Resting Oxygen Saturation  92 % --    Exercise Oxygen Saturation  during 6 min walk 96 % --    Max Ex. HR 150 bpm --    Max Ex. BP 142/80 --    2 Minute Post BP 132/80 --      Interval HR   1 Minute HR 133 --    2 Minute HR 140 --    3 Minute HR 146 --    4 Minute  HR 140 --    5  Minute HR 143 --    6 Minute HR 150 --    2 Minute Post HR 86 --    Interval Heart Rate? Yes --      Interval Oxygen   Interval Oxygen? -- Yes    Baseline Oxygen Saturation % -- 92 %    1 Minute Oxygen Saturation % -- 88 %    1 Minute Liters of Oxygen -- 0 L    2 Minute Oxygen Saturation % -- 86 %    2 Minute Liters of Oxygen -- 0 L    3 Minute Oxygen Saturation % -- 86 %    3 Minute Liters of Oxygen -- 0 L    4 Minute Oxygen Saturation % -- 86 %    4 Minute Liters of Oxygen -- 0 L    5 Minute Oxygen Saturation % -- 87 %    5 Minute Liters of Oxygen -- 0 L    6 Minute Oxygen Saturation % -- 87 %    6 Minute Liters of Oxygen -- 0 L    2 Minute Post Oxygen Saturation % -- 93 %    2 Minute Post Liters of Oxygen -- 0 L             Oxygen Initial Assessment:  Oxygen Initial Assessment - 10/14/23 1431       Home Oxygen   Home Oxygen Device None    Sleep Oxygen Prescription CPAP   At night   Home Exercise Oxygen Prescription None    Home Resting Oxygen Prescription None    Compliance with Home Oxygen Use Yes   Compliance with CPAP use            Oxygen Re-Evaluation:  Oxygen Re-Evaluation     Row Name 10/27/23 1423             Program Oxygen Prescription   Program Oxygen Prescription None         Home Oxygen   Home Oxygen Device None       Sleep Oxygen Prescription CPAP       Home Exercise Oxygen Prescription None       Home Resting Oxygen Prescription None       Compliance with Home Oxygen Use Yes         Goals/Expected Outcomes   Short Term Goals To learn and understand importance of monitoring SPO2 with pulse oximeter and demonstrate accurate use of the pulse oximeter.;To learn and understand importance of maintaining oxygen saturations>88%;To learn and demonstrate proper pursed lip breathing techniques or other breathing techniques. ;To learn and demonstrate proper use of respiratory medications       Long  Term Goals Verbalizes importance of monitoring  SPO2 with pulse oximeter and return demonstration;Exhibits proper breathing techniques, such as pursed lip breathing or other method taught during program session;Demonstrates proper use of MDI's;Compliance with respiratory medication;Maintenance of O2 saturations>88%       Comments Patient is taking his prescribed medications as he should. States breathing has gotten a little better since starting the program as well.       Goals/Expected Outcomes Short: Continue attending rehab and compliance with meds. Long: Increase exercise independance at home once program is finished.                Oxygen Discharge (Final Oxygen Re-Evaluation):  Oxygen Re-Evaluation - 10/27/23 1423       Program Oxygen Prescription  Program Oxygen Prescription None      Home Oxygen   Home Oxygen Device None    Sleep Oxygen Prescription CPAP    Home Exercise Oxygen Prescription None    Home Resting Oxygen Prescription None    Compliance with Home Oxygen Use Yes      Goals/Expected Outcomes   Short Term Goals To learn and understand importance of monitoring SPO2 with pulse oximeter and demonstrate accurate use of the pulse oximeter.;To learn and understand importance of maintaining oxygen saturations>88%;To learn and demonstrate proper pursed lip breathing techniques or other breathing techniques. ;To learn and demonstrate proper use of respiratory medications    Long  Term Goals Verbalizes importance of monitoring SPO2 with pulse oximeter and return demonstration;Exhibits proper breathing techniques, such as pursed lip breathing or other method taught during program session;Demonstrates proper use of MDI's;Compliance with respiratory medication;Maintenance of O2 saturations>88%    Comments Patient is taking his prescribed medications as he should. States breathing has gotten a little better since starting the program as well.    Goals/Expected Outcomes Short: Continue attending rehab and compliance with meds.  Long: Increase exercise independance at home once program is finished.             Initial Exercise Prescription:  Initial Exercise Prescription - 10/14/23 1500       Date of Initial Exercise RX and Referring Provider   Date 10/14/23    Referring Provider Sandrea Hughs MD      Oxygen   Maintain Oxygen Saturation 88% or higher      Treadmill   MPH 1.5    Grade 0    Minutes 15    METs 1.9      REL-XR   Level 1    Speed 50    Minutes 15    METs 1.8      Prescription Details   Frequency (times per week) 2    Duration Progress to 30 minutes of continuous aerobic without signs/symptoms of physical distress      Intensity   THRR 40-80% of Max Heartrate 102/131    Ratings of Perceived Exertion 11-13    Perceived Dyspnea 0-4      Resistance Training   Training Prescription Yes    Weight 4    Reps 10-15             Perform Capillary Blood Glucose checks as needed.  Exercise Prescription Changes:   Exercise Prescription Changes     Row Name 10/14/23 1500 10/27/23 1500           Response to Exercise   Blood Pressure (Admit) 130/80 128/80      Blood Pressure (Exercise) 142/80 130/80      Blood Pressure (Exit) 132/80 112/60      Heart Rate (Admit) 73 bpm 68 bpm      Heart Rate (Exercise) 150 bpm 108 bpm      Heart Rate (Exit) 86 bpm 87 bpm      Oxygen Saturation (Admit) 92 % 93 %      Oxygen Saturation (Exercise) 86 % 92 %      Oxygen Saturation (Exit) 93 % 94 %      Rating of Perceived Exertion (Exercise) 13 16      Perceived Dyspnea (Exercise) 2 2      Duration Continue with 30 min of aerobic exercise without signs/symptoms of physical distress. Continue with 30 min of aerobic exercise without signs/symptoms of physical distress.  Intensity THRR unchanged THRR unchanged        Progression   Progression Continue to progress workloads to maintain intensity without signs/symptoms of physical distress. Continue to progress workloads to maintain  intensity without signs/symptoms of physical distress.        Resistance Training   Training Prescription -- Yes      Weight -- 4      Reps -- 10-15        Treadmill   MPH -- 1.5      Grade -- 0.5      Minutes -- 15      METs -- 2.25        REL-XR   Level -- 2      Speed -- 46      Minutes -- 15      METs -- 3.4               Exercise Comments:   Exercise Comments     Row Name 10/15/23 1409           Exercise Comments First full day of exercise!  Patient was oriented to gym and equipment including functions, settings, policies, and procedures.  Patient's individual exercise prescription and treatment plan were reviewed.  All starting workloads were established based on the results of the 6 minute walk test done at initial orientation visit.  The plan for exercise progression was also introduced and progression will be customized based on patient's performance and goals.                Exercise Goals and Review:   Exercise Goals     Row Name 10/14/23 1514             Exercise Goals   Increase Physical Activity Yes       Intervention Provide advice, education, support and counseling about physical activity/exercise needs.;Develop an individualized exercise prescription for aerobic and resistive training based on initial evaluation findings, risk stratification, comorbidities and participant's personal goals.       Expected Outcomes Short Term: Attend rehab on a regular basis to increase amount of physical activity.;Long Term: Add in home exercise to make exercise part of routine and to increase amount of physical activity.;Long Term: Exercising regularly at least 3-5 days a week.       Increase Strength and Stamina Yes       Intervention Provide advice, education, support and counseling about physical activity/exercise needs.;Develop an individualized exercise prescription for aerobic and resistive training based on initial evaluation findings, risk  stratification, comorbidities and participant's personal goals.       Expected Outcomes Short Term: Increase workloads from initial exercise prescription for resistance, speed, and METs.;Short Term: Perform resistance training exercises routinely during rehab and add in resistance training at home;Long Term: Improve cardiorespiratory fitness, muscular endurance and strength as measured by increased METs and functional capacity ( )       Able to understand and use rate of perceived exertion (RPE) scale Yes       Intervention Provide education and explanation on how to use RPE scale       Expected Outcomes Short Term: Able to use RPE daily in rehab to express subjective intensity level;Long Term:  Able to use RPE to guide intensity level when exercising independently       Able to understand and use Dyspnea scale Yes       Intervention Provide education and explanation on how to use Dyspnea scale  Expected Outcomes Short Term: Able to use Dyspnea scale daily in rehab to express subjective sense of shortness of breath during exertion;Long Term: Able to use Dyspnea scale to guide intensity level when exercising independently       Knowledge and understanding of Target Heart Rate Range (THRR) Yes       Intervention Provide education and explanation of THRR including how the numbers were predicted and where they are located for reference       Expected Outcomes Short Term: Able to use daily as guideline for intensity in rehab;Short Term: Able to state/look up THRR;Long Term: Able to use THRR to govern intensity when exercising independently       Able to check pulse independently Yes       Intervention Provide education and demonstration on how to check pulse in carotid and radial arteries.;Review the importance of being able to check your own pulse for safety during independent exercise       Expected Outcomes Short Term: Able to explain why pulse checking is important during independent  exercise;Long Term: Able to check pulse independently and accurately       Understanding of Exercise Prescription Yes       Intervention Provide education, explanation, and written materials on patient's individual exercise prescription       Expected Outcomes Short Term: Able to explain program exercise prescription;Long Term: Able to explain home exercise prescription to exercise independently                Exercise Goals Re-Evaluation :  Exercise Goals Re-Evaluation     Row Name 10/15/23 1410 10/27/23 1421 10/28/23 0833         Exercise Goal Re-Evaluation   Exercise Goals Review -- Increase Physical Activity;Increase Strength and Stamina;Able to understand and use Dyspnea scale;Able to understand and use rate of perceived exertion (RPE) scale;Able to check pulse independently Increase Physical Activity;Increase Strength and Stamina;Understanding of Exercise Prescription     Comments Reviewed RPE and dyspnea scale, THR and program prescription with pt today.  Pt voiced understanding and was given a copy of goals to take home. Shalom says he doesn't do much exercise outside the program. He does minimal walking outside of here, but not much. Asaad is doing well in rehab and is on his 5th visit. He has increased his grade on the treadmill to 0.5 and has also increased his level on the XR to 2. WIll continue to montior and progress asable.     Expected Outcomes Short: Use RPE daily to regulate intensity.  Long: Follow program prescription in THR. Short: Continue to attend rehab. Long term: Start exercising at home. Continue to attend rehab              Discharge Exercise Prescription (Final Exercise Prescription Changes):  Exercise Prescription Changes - 10/27/23 1500       Response to Exercise   Blood Pressure (Admit) 128/80    Blood Pressure (Exercise) 130/80    Blood Pressure (Exit) 112/60    Heart Rate (Admit) 68 bpm    Heart Rate (Exercise) 108 bpm    Heart Rate (Exit) 87  bpm    Oxygen Saturation (Admit) 93 %    Oxygen Saturation (Exercise) 92 %    Oxygen Saturation (Exit) 94 %    Rating of Perceived Exertion (Exercise) 16    Perceived Dyspnea (Exercise) 2    Duration Continue with 30 min of aerobic exercise without signs/symptoms of physical distress.  Intensity THRR unchanged      Progression   Progression Continue to progress workloads to maintain intensity without signs/symptoms of physical distress.      Resistance Training   Training Prescription Yes    Weight 4    Reps 10-15      Treadmill   MPH 1.5    Grade 0.5    Minutes 15    METs 2.25      REL-XR   Level 2    Speed 46    Minutes 15    METs 3.4             Nutrition:  Target Goals: Understanding of nutrition guidelines, daily intake of sodium 1500mg , cholesterol 200mg , calories 30% from fat and 7% or less from saturated fats, daily to have 5 or more servings of fruits and vegetables.  Biometrics:  Pre Biometrics - 10/14/23 1515       Pre Biometrics   Height 5\' 7"  (1.702 m)    Weight 233 lb 0.4 oz (105.7 kg)    Waist Circumference 50 inches    Hip Circumference 41 inches    Waist to Hip Ratio 1.22 %    BMI (Calculated) 36.49    Grip Strength 26.7 kg    Single Leg Stand 4 seconds              Nutrition Therapy Plan and Nutrition Goals:   Nutrition Assessments:  Nutrition Assessments - 10/14/23 1517       Rate Your Plate Scores   Pre Score 0.89            MEDIFICTS Score Key: >=70 Need to make dietary changes  40-70 Heart Healthy Diet <= 40 Therapeutic Level Cholesterol Diet  Flowsheet Row PULMONARY REHAB COPD ORIENTATION from 10/14/2023 in [facility] CARD[facility]AC REHABILITATION  Picture Your Plate Total Score on Admission 77      Picture Your Plate Scores: <16 Unhealthy dietary pattern with much room for improvement. 41-50 Dietary pattern unlikely to meet recommendations for good health and room for improvement. 51-60 More healthful dietary  pattern, with some room for improvement.  >60 Healthy dietary pattern, although there may be some specific behaviors that could be improved.    Nutrition Goals Re-Evaluation:  Nutrition Goals Re-Evaluation     Row Name 10/27/23 1416             Goals   Nutrition Goal Continue to attend rehab and eat healthy.       Comment Johnpatrick says him and his partner overall try to eat a healthy diet. They did have a cheat weekend at the [facility], but they split the cheesecake.       Expected Outcome Short: Eat healthy. Long term: Lose some weight and work on portion control.                Nutrition Goals Discharge (Final Nutrition Goals Re-Evaluation):  Nutrition Goals Re-Evaluation - 10/27/23 1416       Goals   Nutrition Goal Continue to attend rehab and eat healthy.    Comment Broden says him and his partner overall try to eat a healthy diet. They did have a cheat weekend at the [facility], but they split the cheesecake.    Expected Outcome Short: Eat healthy. Long term: Lose some weight and work on portion control.             Psychosocial: Target Goals: Acknowledge presence or absence of significant depression and/or stress, maximize coping  skills, provide positive support system. Participant is able to verbalize types and ability to use techniques and skills needed for reducing stress and depression.  Initial Review & Psychosocial Screening:  Initial Psych Review & Screening - 10/14/23 1432       Initial Review   Current issues with Current Sleep Concerns   Wakes up once every night- falls back asleep within the hour.     Family Dynamics   Good Support System? Yes    Comments Husband Zella Ball is support system.      Barriers   Psychosocial barriers to participate in program There are no identifiable barriers or psychosocial needs.;The patient should benefit from training in stress management and relaxation.      Screening Interventions   Interventions  Encouraged to exercise;Provide feedback about the scores to participant;To provide support and resources with identified psychosocial needs    Expected Outcomes Short Term goal: Utilizing psychosocial counselor, staff and physician to assist with identification of specific Stressors or current issues interfering with healing process. Setting desired goal for each stressor or current issue identified.;Long Term Goal: Stressors or current issues are controlled or eliminated.;Short Term goal: Identification and review with participant of any Quality of Life or Depression concerns found by scoring the questionnaire.;Long Term goal: The participant improves quality of Life and PHQ9 Scores as seen by post scores and/or verbalization of changes             Quality of Life Scores:  Scores of 19 and below usually indicate a poorer quality of life in these areas.  A difference of  2-3 points is a clinically meaningful difference.  A difference of 2-3 points in the total score of the Quality of Life Index has been associated with significant improvement in overall quality of life, self-image, physical symptoms, and general health in studies assessing change in quality of life.   PHQ-9: Review Flowsheet  More data exists      10/12/2023 06/19/2023 06/09/2023 05/13/2023 03/10/2023  Depression screen PHQ 2/9  Decreased Interest 0 0 0 0 0  Down, Depressed, Hopeless 0 0 0 0 0  PHQ - 2 Score 0 0 0 0 0  Altered sleeping 1 0 0 - 0  Tired, decreased energy - 0 0 - 0  Change in appetite 0 0 0 - 0  Feeling bad or failure about yourself  0 0 0 - 0  Trouble concentrating 0 0 0 - 0  Moving slowly or fidgety/restless 0 0 0 - 0  Suicidal thoughts 0 0 0 - 0  PHQ-9 Score 1 0 0 - 0  Difficult doing work/chores Not difficult at all Not difficult at all Not difficult at all - -   Interpretation of Total Score  Total Score Depression Severity:  1-4 = Minimal depression, 5-9 = Mild depression, 10-14 = Moderate  depression, 15-19 = Moderately severe depression, 20-27 = Severe depression   Psychosocial Evaluation and Intervention:  Psychosocial Evaluation - 10/14/23 1524       Psychosocial Evaluation & Interventions   Interventions Encouraged to exercise with the program and follow exercise prescription;Relaxation education;Stress management education    Comments Rebel was referred to this program for his diagnosis of COPD. Currently he gets short of breath with minimal activities, yet he wants to improve this to not be as short of breath as often. 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 He occasionally will  get dizzy first thing in the mornings, but it resolves shortly after sitting on the side of the bed. He denies any stressors in his life, his support system consists of his signifcant other, Robin. Quang is wanting to regain his strength and stamina and not be so short of breath as a result of this program. No barries identified to complete the program.    Continue Psychosocial Services  Follow up required by staff             Psychosocial Re-Evaluation:  Psychosocial Re-Evaluation     Row Name 10/27/23 1414             Psychosocial Re-Evaluation   Current issues with None Identified       Comments Diontae states that his stress level is at a good level, no current life stressors. He says he is sleeping well and his support system is great.       Expected Outcomes Short: Continue to attend rehab. Long: Continue to have as few stressors in life as possible and continue sleeping well.       Interventions Encouraged to attend Pulmonary Rehabilitation for the exercise       Continue Psychosocial Services  Follow up required by staff                Psychosocial Discharge (Final Psychosocial Re-Evaluation):  Psychosocial Re-Evaluation - 10/27/23 1414       Psychosocial Re-Evaluation   Current issues with  None Identified    Comments Kermitt states that his stress level is at a good level, no current life stressors. He says he is sleeping well and his support system is great.    Expected Outcomes Short: Continue to attend rehab. Long: Continue to have as few stressors in life as possible and continue sleeping well.    Interventions Encouraged to attend Pulmonary Rehabilitation for the exercise    Continue Psychosocial Services  Follow up required by staff              Education: Education Goals: Education classes will be provided on a weekly basis, covering required topics. Participant will state understanding/return demonstration of topics presented.  Learning Barriers/Preferences:  Learning Barriers/Preferences - 10/14/23 1435       Learning Barriers/Preferences   Learning Barriers None    Learning Preferences Written Material;Video;Individual Instruction;Group Instruction             Education Topics: How Lungs Work and Diseases: - Discuss the anatomy of the lungs and diseases that can affect the lungs, such as COPD.   Exercise: -Discuss the importance of exercise, FITT principles of exercise, normal and abnormal responses to exercise, and how to exercise safely.   Environmental Irritants: -Discuss types of environmental irritants and how to limit exposure to environmental irritants.   Meds/Inhalers and oxygen: - Discuss respiratory medications, definition of an inhaler and oxygen, and the proper way to use an inhaler and oxygen.   Energy Saving Techniques: - Discuss methods to conserve energy and decrease shortness of breath when performing activities of daily living.    Bronchial Hygiene / Breathing Techniques: - Discuss breathing mechanics, pursed-lip breathing technique,  proper posture, effective ways to clear airways, and other functional breathing techniques   Cleaning Equipment: - Provides group verbal and written instruction about the health risks of  elevated stress, cause of high stress, and healthy ways to reduce stress.   Nutrition I: Fats: - Discuss the types of cholesterol, what cholesterol does to the body,  and how cholesterol levels can be controlled. Flowsheet Row PULMONARY REHAB CHRONIC OBSTRUCTIVE PULMONARY DISEASE from 11/05/2023 in [facility]  Date 10/22/23  Educator [facility]  Instruction Review Code 1- Verbalizes Understanding       Nutrition II: Labels: -Discuss the different components of food labels and how to read food labels.   Respiratory Infections: - Discuss the signs and symptoms of respiratory infections, ways to prevent respiratory infections, and the importance of seeking medical treatment when having a respiratory infection.   Stress I: Signs and Symptoms: - Discuss the causes of stress, how stress may lead to anxiety and depression, and ways to limit stress. Flowsheet Row PULMONARY REHAB CHRONIC OBSTRUCTIVE PULMONARY DISEASE from 11/05/2023 in [place] [facility]  Date 10/29/23  Educator DJ  Instruction Review Code 1- Verbalizes Understanding       Stress II: Relaxation: -Discuss relaxation techniques to limit stress. Flowsheet Row PULMONARY REHAB CHRONIC OBSTRUCTIVE PULMONARY DISEASE from 11/05/2023 in [place] [facility]  Date 10/15/23  Educator [facility]  Instruction Review Code 1- Verbalizes Understanding       Oxygen for Home/Travel: - Discuss how to prepare for travel when on oxygen and proper ways to transport and store oxygen to ensure safety.   Knowledge Questionnaire Score:  Knowledge Questionnaire Score - 10/14/23 1541       Knowledge Questionnaire Score   Pre Score 16/18             Core Components/Risk Factors/Patient Goals at Admission:  Personal Goals and Risk Factors at Admission - 10/14/23 1519       Core Components/Risk Factors/Patient Goals on Admission    Weight Management Yes    Intervention Weight Management: Develop  a combined nutrition and exercise program designed to reach desired caloric intake, while maintaining appropriate intake of nutrient and fiber, sodium and fats, and appropriate energy expenditure required for the weight goal.;Weight Management: Provide education and appropriate resources to help participant work on and attain dietary goals.;Weight Management/Obesity: Establish reasonable short term and long term weight goals.    Admit Weight 233 lb 0.4 oz (105.7 kg)    Improve shortness of breath with ADL's Yes    Intervention Provide education, individualized exercise plan and daily activity instruction to help decrease symptoms of SOB with activities of daily living.    Expected Outcomes Short Term: Improve cardiorespiratory fitness to achieve a reduction of symptoms when performing ADLs    Increase knowledge of respiratory medications and ability to use respiratory devices properly  Yes    Intervention Provide education and demonstration as needed of appropriate use of medications, inhalers, and oxygen therapy.    Expected Outcomes Short Term: Achieves understanding of medications use. Understands that oxygen is a medication prescribed by physician. Demonstrates appropriate use of inhaler and oxygen therapy.;Long Term: Maintain appropriate use of medications, inhalers, and oxygen therapy.    Diabetes Yes    Intervention Provide education about signs/symptoms and action to take for hypo/hyperglycemia.;Provide education about proper nutrition, including hydration, and aerobic/resistive exercise prescription along with prescribed medications to achieve blood glucose in normal ranges: Fasting glucose 65-99 mg/dL    Expected Outcomes Short Term: Participant verbalizes understanding of the signs/symptoms and immediate care of hyper/hypoglycemia, proper foot care and importance of medication, aerobic/resistive exercise and nutrition plan for blood glucose control.;Long Term: Attainment of HbA1C < 7%.     Hypertension Yes    Intervention Monitor prescription use compliance.;Provide education on lifestyle modifcations including regular physical activity/exercise, weight management, moderate  sodium restriction and increased consumption of fresh fruit, vegetables, and low fat dairy, alcohol moderation, and smoking cessation.    Expected Outcomes Long Term: Maintenance of blood pressure at goal levels.;Short Term: Continued assessment and intervention until BP is < 140/9mm HG in hypertensive participants. < 130/30mm HG in hypertensive participants with diabetes, heart failure or chronic kidney disease.    Lipids Yes    Intervention Provide education and support for participant on nutrition & aerobic/resistive exercise along with prescribed medications to achieve LDL 70mg , HDL >40mg .    Expected Outcomes Short Term: Participant states understanding of desired cholesterol values and is compliant with medications prescribed. Participant is following exercise prescription and nutrition guidelines.;Long Term: Cholesterol controlled with medications as prescribed, with individualized exercise RX and with personalized nutrition plan. Value goals: LDL < 70mg , HDL > 40 mg.             Core Components/Risk Factors/Patient Goals Review:   Goals and Risk Factor Review     Row Name 10/27/23 1418             Core Components/Risk Factors/Patient Goals Review   Personal Goals Review Weight Management/Obesity;Increase knowledge of respiratory medications and ability to use respiratory devices properly.;Lipids       Review Aydin said his breathing has been a little better since attending the program. He does not have a pulse oximeter to check his sat's at home but he is planning on buying one in the near future. His BP runs stable like it does here. He is taking his medications like he is supposed to.       Expected Outcomes Short term: Continue to attend rehab. Long term: Breathing continue to gets better.                 Core Components/Risk Factors/Patient Goals at Discharge (Final Review):   Goals and Risk Factor Review - 10/27/23 1418       Core Components/Risk Factors/Patient Goals Review   Personal Goals Review Weight Management/Obesity;Increase knowledge of respiratory medications and ability to use respiratory devices properly.;Lipids    Review Randeep said his breathing has been a little better since attending the program. He does not have a pulse oximeter to check his sat's at home but he is planning on buying one in the near future. His BP runs stable like it does here. He is taking his medications like he is supposed to.    Expected Outcomes Short term: Continue to attend rehab. Long term: Breathing continue to gets better.             ITP Comments:  ITP Comments     Row Name 10/14/23 1533 10/15/23 1409 11/11/23 1145       ITP Comments Patient attend orientation today.  Patient is attending Pulmonary Rehabilitation Program.  Documentation for diagnosis can be found in [facility]/EPIC.  Reviewed medical chart, RPE/RPD, gym safety, and program guidelines.  Patient was fitted to equipment they will be using during rehab.  Patient is scheduled to start exercise on 10/15/23.   Initial ITP created and sent for review and signature by Dr. Erick Blinks, Medical Director for Pulmonary Rehabilitation Program. First full day of exercise!  Patient was oriented to gym and equipment including functions, settings, policies, and procedures.  Patient's individual exercise prescription and treatment plan were reviewed.  All starting workloads were established based on the results of the 6 minute walk test done at initial orientation visit.  The plan for exercise progression was also  introduced and progression will be customized based on patient's performance and goals. 30 day review completed. ITP sent to Dr.Jehanzeb Memon, Medical Director of  Pulmonary Rehab. Continue with ITP unless changes are made by  physician.              Comments: 30 day review

## 2023-11-12 ENCOUNTER — Encounter (HOSPITAL_COMMUNITY): Payer: PPO

## 2023-11-17 ENCOUNTER — Encounter (HOSPITAL_COMMUNITY): Payer: PPO

## 2023-11-19 ENCOUNTER — Encounter (HOSPITAL_COMMUNITY)
Admission: RE | Admit: 2023-11-19 | Discharge: 2023-11-19 | Disposition: A | Discharge status: Home / Self Care | Payer: PPO | Source: Ambulatory Visit | Attending: Internal Medicine | Admitting: Internal Medicine

## 2023-11-19 DIAGNOSIS — J449 Chronic obstructive pulmonary disease, unspecified: Secondary | ICD-10-CM | POA: Insufficient documentation

## 2023-11-19 NOTE — Progress Notes (Signed)
 Daily Session Note  Patient Details  Name: Jerome Sanchez MRN: [id number] Date of Birth: 1951/01/26 Referring Provider:   Flowsheet Row PULMONARY REHAB COPD ORIENTATION from 10/14/2023 in [facility]  Referring Provider Sandrea Hughs MD       Encounter Date: 11/19/2023  Check In:  Session Check In - 11/19/23 1315       Check-In   Supervising physician immediately available to respond to emergencies See telemetry face sheet for immediately available MD    Location [facility]    Staff Present Avanell Shackleton BSN, RN;Heather Fredric Mare, [place], Exercise Physiologist;Tina Jake Shark, RN    Virtual Visit No    Medication changes reported     No    Fall or balance concerns reported    No    Tobacco Cessation No Change    Warm-up and Cool-down Performed on first and last piece of equipment    Resistance Training Performed Yes    VAD Patient? No    PAD/SET Patient? No      Pain Assessment   Currently in Pain? No/denies    Multiple Pain Sites No             Capillary Blood Glucose: No results found for this or any previous visit (from the past 24 hours).    Social History   Tobacco Use  Smoking Status Former   Current packs/day: 0.00   Average packs/day: 1.3 packs/day for 51.0 years (63.8 ttl pk-yrs)   Types: Cigarettes   Start date: 46   Quit date: 2018   Years since quitting: 7.2  Smokeless Tobacco Never    Goals Met:  Proper associated with RPD/PD & O2 Sat Independence with exercise equipment Using PLB without cueing & demonstrates good technique Exercise tolerated well Queuing for purse lip breathing No report of concerns or symptoms today Strength training completed today  Goals Unmet:  Not Applicable  Comments: Marland KitchenMarland KitchenPt able to follow exercise prescription today without complaint.  Will continue to monitor for progression.

## 2023-11-20 DIAGNOSIS — H35371 Puckering of macula, right eye: Secondary | ICD-10-CM | POA: Diagnosis not present

## 2023-11-20 DIAGNOSIS — Z961 Presence of intraocular lens: Secondary | ICD-10-CM | POA: Diagnosis not present

## 2023-11-20 DIAGNOSIS — H43813 Vitreous degeneration, bilateral: Secondary | ICD-10-CM | POA: Diagnosis not present

## 2023-11-20 LAB — HM DIABETES EYE EXAM

## 2023-11-23 DIAGNOSIS — N281 Cyst of kidney, acquired: Secondary | ICD-10-CM | POA: Diagnosis not present

## 2023-11-24 ENCOUNTER — Encounter (HOSPITAL_COMMUNITY)
Admission: RE | Admit: 2023-11-24 | Discharge: 2023-11-24 | Disposition: A | Discharge status: Home / Self Care | Payer: PPO | Source: Ambulatory Visit | Attending: Internal Medicine | Admitting: Internal Medicine

## 2023-11-24 DIAGNOSIS — J449 Chronic obstructive pulmonary disease, unspecified: Secondary | ICD-10-CM | POA: Diagnosis not present

## 2023-11-24 NOTE — Progress Notes (Signed)
 Daily Session Note  Patient Details  Name: Jerome Sanchez MRN: [id number] Date of Birth: October 05, 1950 Referring Provider:   Flowsheet Row PULMONARY REHAB COPD ORIENTATION from 10/14/2023 in [facility]  Referring Provider Sandrea Hughs MD       Encounter Date: 11/24/2023  Check In:  Session Check In - 11/24/23 1351       Check-In   Supervising physician immediately available to respond to emergencies See telemetry face sheet for immediately available MD    Location [facility]    Staff Present Desiree Lucy, BSN, RN, Sherlyn Hay, MA, RCEP, CCRP, CCET    Virtual Visit No    Medication changes reported     No    Fall or balance concerns reported    No    Tobacco Cessation No Change    Warm-up and Cool-down Performed on first and last piece of equipment    Resistance Training Performed Yes    VAD Patient? No    PAD/SET Patient? No      Pain Assessment   Currently in Pain? No/denies             Capillary Blood Glucose: No results found for this or any previous visit (from the past 24 hours).    Social History   Tobacco Use  Smoking Status Former   Current packs/day: 0.00   Average packs/day: 1.3 packs/day for 51.0 years (63.8 ttl pk-yrs)   Types: Cigarettes   Start date: 33   Quit date: 2018   Years since quitting: 7.2  Smokeless Tobacco Never    Goals Met:  Proper associated with RPD/PD & O2 Sat Independence with exercise equipment Improved SOB with ADL's Using PLB without cueing & demonstrates good technique Exercise tolerated well No report of concerns or symptoms today Strength training completed today  Goals Unmet:  Not Applicable  Comments: Pt able to follow exercise prescription today without complaint.  Will continue to monitor for progression.

## 2023-11-26 ENCOUNTER — Encounter (HOSPITAL_COMMUNITY)
Admission: RE | Admit: 2023-11-26 | Discharge: 2023-11-26 | Disposition: A | Discharge status: Home / Self Care | Payer: PPO | Source: Ambulatory Visit | Attending: Internal Medicine | Admitting: Internal Medicine

## 2023-11-26 DIAGNOSIS — J449 Chronic obstructive pulmonary disease, unspecified: Secondary | ICD-10-CM

## 2023-11-26 NOTE — Progress Notes (Signed)
 Daily Session Note  Patient Details  Name: Jerome Sanchez MRN: [id number] Date of Birth: 1951/05/18 Referring Provider:   Flowsheet Row PULMONARY REHAB COPD ORIENTATION from 10/14/2023 in [facility]  Referring Provider Sandrea Hughs MD       Encounter Date: 11/26/2023  Check In:  Session Check In - 11/26/23 1315       Check-In   Supervising physician immediately available to respond to emergencies See telemetry face sheet for immediately available MD    Location [facility]    Staff Present Avanell Shackleton BSN, RN;Heather Fredric Mare, BS, Exercise Physiologist;Jessica Barksdale, MA, RCEP, CCRP, Dow Adolph, RN, BSN    Virtual Visit No    Medication changes reported     No    Fall or balance concerns reported    No    Tobacco Cessation No Change    Warm-up and Cool-down Performed on first and last piece of equipment    Resistance Training Performed Yes    VAD Patient? No    PAD/SET Patient? No      Pain Assessment   Currently in Pain? No/denies    Multiple Pain Sites No             Capillary Blood Glucose: No results found for this or any previous visit (from the past 24 hours).    Social History   Tobacco Use  Smoking Status Former   Current packs/day: 0.00   Average packs/day: 1.3 packs/day for 51.0 years (63.8 ttl pk-yrs)   Types: Cigarettes   Start date: 59   Quit date: 2018   Years since quitting: 7.2  Smokeless Tobacco Never    Goals Met:  Proper associated with RPD/PD & O2 Sat Independence with exercise equipment Using PLB without cueing & demonstrates good technique Exercise tolerated well Queuing for purse lip breathing No report of concerns or symptoms today Strength training completed today  Goals Unmet:  Not Applicable  Comments: Marland KitchenMarland KitchenPt able to follow exercise prescription today without complaint.  Will continue to monitor for progression.

## 2023-12-01 ENCOUNTER — Encounter (HOSPITAL_COMMUNITY)
Admission: RE | Admit: 2023-12-01 | Discharge: 2023-12-01 | Disposition: A | Discharge status: Home / Self Care | Payer: PPO | Source: Ambulatory Visit | Attending: Internal Medicine | Admitting: Internal Medicine

## 2023-12-01 DIAGNOSIS — J449 Chronic obstructive pulmonary disease, unspecified: Secondary | ICD-10-CM | POA: Diagnosis not present

## 2023-12-01 NOTE — Progress Notes (Signed)
 Daily Session Note  Patient Details  Name: Jerome Sanchez MRN: [id number] Date of Birth: 10-17-1950 Referring Provider:   Flowsheet Row PULMONARY REHAB COPD ORIENTATION from 10/14/2023 in [facility]  Referring Provider Vernestine Gondola MD       Encounter Date: 12/01/2023  Check In:  Session Check In - 12/01/23 1330       Check-In   Supervising physician immediately available to respond to emergencies See telemetry face sheet for immediately available MD    Location [facility]    Staff Present Balinda Level, BS, RRT, CPFT;Heather Alec [place], Exercise Physiologist;Brittany Annette Barters, BSN, RN, WTA-C;Hansford Hirt, RN    Virtual Visit No    Medication changes reported     No    Fall or balance concerns reported    No    Warm-up and Cool-down Performed on first and last piece of equipment    Resistance Training Performed Yes    VAD Patient? No    PAD/SET Patient? No      Pain Assessment   Currently in Pain? No/denies    Multiple Pain Sites No             Capillary Blood Glucose: No results found for this or any previous visit (from the past 24 hours).    Social History   Tobacco Use  Smoking Status Former   Current packs/day: 0.00   Average packs/day: 1.3 packs/day for 51.0 years (63.8 ttl pk-yrs)   Types: Cigarettes   Start date: 15   Quit date: 2018   Years since quitting: 7.2  Smokeless Tobacco Never    Goals Met:  Proper associated with RPD/PD & O2 Sat Improved SOB with ADL's Using PLB without cueing & demonstrates good technique Exercise tolerated well No report of concerns or symptoms today Strength training completed today  Goals Unmet:  Not Applicable  Comments: Pt able to follow exercise prescription today without complaint.  Will continue to monitor for progression.

## 2023-12-03 ENCOUNTER — Encounter (HOSPITAL_COMMUNITY)
Admission: RE | Admit: 2023-12-03 | Discharge: 2023-12-03 | Disposition: A | Discharge status: Home / Self Care | Payer: PPO | Source: Ambulatory Visit | Attending: Internal Medicine | Admitting: Internal Medicine

## 2023-12-03 DIAGNOSIS — J449 Chronic obstructive pulmonary disease, unspecified: Secondary | ICD-10-CM | POA: Diagnosis not present

## 2023-12-03 NOTE — Progress Notes (Signed)
 Daily Session Note  Patient Details  Name: Jerome Sanchez MRN: [id number] Date of Birth: 07-05-1951 Referring Provider:   Flowsheet Row PULMONARY REHAB COPD ORIENTATION from 10/14/2023 in [facility] RE[facility]  Referring Provider Vernestine Gondola MD       Encounter Date: 12/03/2023  Check In:  Session Check In - 12/03/23 1315       Check-In   Supervising physician immediately available to respond to emergencies See telemetry face sheet for immediately available MD    Location [facility]    Staff Present Doug Gehrig, RN, BSN;Phyllis Billingsley, RN;Hillary Troutman BSN, RN;Brooke [place], RN    Virtual Visit No    Medication changes reported     No    Fall or balance concerns reported    No    Warm-up and Cool-down Performed on first and last piece of equipment    Resistance Training Performed Yes    VAD Patient? No    PAD/SET Patient? No      Pain Assessment   Currently in Pain? No/denies    Multiple Pain Sites No             Capillary Blood Glucose: No results found for this or any previous visit (from the past 24 hours).    Social History   Tobacco Use  Smoking Status Former   Current packs/day: 0.00   Average packs/day: 1.3 packs/day for 51.0 years (63.8 ttl pk-yrs)   Types: Cigarettes   Start date: 6   Quit date: 2018   Years since quitting: 7.2  Smokeless Tobacco Never    Goals Met:  Proper associated with RPD/PD & O2 Sat Independence with exercise equipment Using PLB without cueing & demonstrates good technique Exercise tolerated well No report of concerns or symptoms today Strength training completed today  Goals Unmet:  Not Applicable  Comments: Pt able to follow exercise prescription today without complaint.  Will continue to monitor for progression.

## 2023-12-07 DIAGNOSIS — G4733 Obstructive sleep apnea (adult) (pediatric): Secondary | ICD-10-CM | POA: Diagnosis not present

## 2023-12-08 ENCOUNTER — Encounter (HOSPITAL_COMMUNITY)
Admission: RE | Admit: 2023-12-08 | Discharge: 2023-12-08 | Disposition: A | Discharge status: Home / Self Care | Payer: PPO | Source: Ambulatory Visit | Attending: Internal Medicine | Admitting: Internal Medicine

## 2023-12-08 ENCOUNTER — Ambulatory Visit: Payer: PPO | Admitting: Family Medicine

## 2023-12-08 DIAGNOSIS — J449 Chronic obstructive pulmonary disease, unspecified: Secondary | ICD-10-CM | POA: Diagnosis not present

## 2023-12-08 NOTE — Progress Notes (Signed)
 Daily Session Note  Patient Details  Name: Jerome Sanchez MRN: [id number] Date of Birth: November 04, 1950 Referring Provider:   Flowsheet Row PULMONARY REHAB COPD ORIENTATION from 10/14/2023 in [facility]IAC RE[facility]  Referring Provider Vernestine Gondola MD       Encounter Date: 12/08/2023  Check In:  Session Check In - 12/08/23 1330       Check-In   Supervising physician immediately available to respond to emergencies See telemetry face sheet for immediately available MD    Location [facility]    Staff Present Clotilda Danish, BS, Exercise Physiologist;Brittany Annette Barters, BSN, RN, WTA-C;Janeya Deyo, RN;Jessica [place], MA, RCEP, CCRP, CCET    Virtual Visit No    Medication changes reported     No    Fall or balance concerns reported    No    Warm-up and Cool-down Performed on first and last piece of equipment    Resistance Training Performed Yes    VAD Patient? No    PAD/SET Patient? No      Pain Assessment   Currently in Pain? No/denies    Multiple Pain Sites No             Capillary Blood Glucose: No results found for this or any previous visit (from the past 24 hours).    Social History   Tobacco Use  Smoking Status Former   Current packs/day: 0.00   Average packs/day: 1.3 packs/day for 51.0 years (63.8 ttl pk-yrs)   Types: Cigarettes   Start date: 16   Quit date: 2018   Years since quitting: 7.3  Smokeless Tobacco Never    Goals Met:  Proper associated with RPD/PD & O2 Sat Independence with exercise equipment Using PLB without cueing & demonstrates good technique Exercise tolerated well No report of concerns or symptoms today Strength training completed today  Goals Unmet:  Not Applicable  Comments: Pt able to follow exercise prescription today without complaint.  Will continue to monitor for progression.

## 2023-12-09 ENCOUNTER — Encounter (HOSPITAL_COMMUNITY): Payer: Self-pay | Admitting: *Deleted

## 2023-12-09 DIAGNOSIS — J449 Chronic obstructive pulmonary disease, unspecified: Secondary | ICD-10-CM

## 2023-12-09 NOTE — Progress Notes (Signed)
 Pulmonary Individual Treatment Plan  Patient Details  Name: ANIRUDH BAIZ MRN: [id number] Date of Birth: [birth year] Referring Provider:   Flowsheet Row PULMONARY REHAB COPD ORIENTATION from 10/14/2023 in [facility] CARDIAC REHABILITATION  Referring Provider Vernestine Gondola MD       Initial Encounter Date:  Flowsheet Row PULMONARY REHAB COPD ORIENTATION from 10/14/2023 in [facility]AC [facility]  Date 10/14/23       Visit Diagnosis: Chronic obstructive pulmonary disease, unspecified COPD type (HCC)  Patient's Home Medications on Admission:   Current Outpatient Medications:    albuterol  (VENTOLIN  HFA) 108 (90 Base) MCG/ACT inhaler, INHALE 1 TO 2 PUFFS BY MOUTH EVERY 6 HOURS AS NEEDED FOR WHEEZING FOR SHORTNESS OF BREATH, Disp: 9 g, Rfl: 0   atorvastatin  (LIPITOR) 80 MG tablet, Take 1 tablet (80 mg total) by mouth daily., Disp: 100 tablet, Rfl: 2   dapagliflozin propanediol (FARXIGA) 5 MG TABS tablet, Take 5 mg by mouth daily., Disp: , Rfl:    ezetimibe  (ZETIA ) 10 MG tablet, Take 1 tablet (10 mg total) by mouth daily., Disp: 100 tablet, Rfl: 1   Fluticasone-Umeclidin-Vilant (TRELEGY ELLIPTA ) 100-62.5-25 MCG/ACT AEPB, INHALE 1 PUFF INTO LUNGS ONCE DAILY, Disp: 180 each, Rfl: 6   furosemide  (LASIX ) 40 MG tablet, Take 1 tablet (40 mg total) by mouth daily., Disp: 90 tablet, Rfl: 3   lansoprazole  (PREVACID ) 15 MG capsule, Take 1 capsule (15 mg total) by mouth as needed., Disp: 100 capsule, Rfl: 1   OVER THE COUNTER MEDICATION, Areds ( omega 3, lutein, zeaxanthin) take one a day, Disp: , Rfl:    valsartan  (DIOVAN ) 80 MG tablet, Take 1 tablet (80 mg total) by mouth daily., Disp: 100 tablet, Rfl: 3  Past Medical History: Past Medical History:  Diagnosis Date   Allergy    Arthritis    Asthma    Cardiac arrhythmia    Chronic kidney disease    COPD (chronic obstructive pulmonary disease) (HCC)    Essential hypertension, benign    GERD (gastroesophageal reflux disease)     Impaired glucose tolerance    Injury of right rotator cuff    Lumbar disc disease    Mixed hyperlipidemia    OSA (obstructive sleep apnea)    Sleep apnea    Type 2 diabetes mellitus (HCC)    Vitamin D deficiency     Tobacco Use: Social History   Tobacco Use  Smoking Status Former   Current packs/day: 0.00   Average packs/day: 1.3 packs/day for 51.0 years (63.8 ttl pk-yrs)   Types: Cigarettes   Start date: 47   Quit date: 2018   Years since quitting: 7.3  Smokeless Tobacco Never    Labs: Review Flowsheet  More data exists      Latest Ref Rng & Units 08/22/2021 03/05/2022 07/15/2022 06/22/2023 10/02/2023  Labs for ITP Cardiac and Pulmonary Rehab  Cholestrol 100 - 199 mg/dL 119  147  829  562  130   LDL (calc) 0 - 99 mg/dL 865  784  74  67  71   HDL-C >39 mg/dL 79  84  94  79  85   Trlycerides 0 - 149 mg/dL 696  76  81  62  72   Hemoglobin A1c 4.8 - 5.6 % 6.1  6.0  - 6.6  6.7     Capillary Blood Glucose: No results found for: "GLUCAP"   Pulmonary Assessment Scores:  Pulmonary Assessment Scores     Row Name 10/14/23 [phone]  ADL UCSD   ADL Phase Entry     SOB Score total 33     Rest 0     Walk 2     Stairs 3     Bath 1     Dress 0     Shop 1       CAT Score   CAT Score 14             UCSD: Self-administered rating of dyspnea associated with activities of daily living (ADLs) 6-point scale (0 = "not at all" to 5 = "maximal or unable to do because of breathlessness")  Scoring Scores range from 0 to 120.  Minimally important difference is 5 units  CAT: CAT can identify the health impairment of COPD patients and is better correlated with disease progression.  CAT has a scoring range of zero to 40. The CAT score is classified into four groups of low (less than 10), medium (10 - 20), high (21-30) and very high (31-40) based on the impact level of disease on health status. A CAT score over 10 suggests significant symptoms.  A worsening CAT score could be  explained by an exacerbation, poor medication adherence, poor inhaler technique, or progression of COPD or comorbid conditions.  CAT MCID is 2 points  mMRC: mMRC (Modified Medical Research Council) Dyspnea Scale is used to assess the degree of baseline functional disability in patients of respiratory disease due to dyspnea. No minimal important difference is established. A decrease in score of 1 point or greater is considered a positive change.   Pulmonary Function Assessment:   Exercise Target Goals: Exercise Program Goal: Individual exercise prescription set using results from initial 6 min walk test and THRR while considering  patient's activity barriers and safety.   Exercise Prescription Goal: Initial exercise prescription builds to 30-45 minutes a day of aerobic activity, 2-3 days per week.  Home exercise guidelines will be given to patient during program as part of exercise prescription that the participant will acknowledge.  Activity Barriers & Risk Stratification:  Activity Barriers & Cardiac Risk Stratification - 10/14/23 1426       Activity Barriers & Cardiac Risk Stratification   Activity Barriers Arthritis;Back Problems;Balance Concerns;Shortness of Breath   Arthritis in both shoulders, Back pain due to a crushed disc.            6 Minute Walk:  6 Minute Walk     Row Name 10/14/23 1510 10/14/23 1511       6 Minute Walk   Phase Initial --    Distance 1200 feet --    Walk Time 6 minutes --    # of Rest Breaks 0 --    MPH 2.27 --    METS 2.6 --    RPE 13 --    Perceived Dyspnea  2 --    VO2 Peak 9.1 --    Symptoms No --    Resting HR 73 bpm --    Resting BP 130/80 --    Resting Oxygen Saturation  92 % --    Exercise Oxygen Saturation  during 6 min walk 96 % --    Max Ex. HR 150 bpm --    Max Ex. BP 142/80 --    2 Minute Post BP 132/80 --      Interval HR   1 Minute HR 133 --    2 Minute HR 140 --    3 Minute HR 146 --    4 Minute  HR 140 --    5  Minute HR 143 --    6 Minute HR 150 --    2 Minute Post HR 86 --    Interval Heart Rate? Yes --      Interval Oxygen   Interval Oxygen? -- Yes    Baseline Oxygen Saturation % -- 92 %    1 Minute Oxygen Saturation % -- 88 %    1 Minute Liters of Oxygen -- 0 L    2 Minute Oxygen Saturation % -- 86 %    2 Minute Liters of Oxygen -- 0 L    3 Minute Oxygen Saturation % -- 86 %    3 Minute Liters of Oxygen -- 0 L    4 Minute Oxygen Saturation % -- 86 %    4 Minute Liters of Oxygen -- 0 L    5 Minute Oxygen Saturation % -- 87 %    5 Minute Liters of Oxygen -- 0 L    6 Minute Oxygen Saturation % -- 87 %    6 Minute Liters of Oxygen -- 0 L    2 Minute Post Oxygen Saturation % -- 93 %    2 Minute Post Liters of Oxygen -- 0 L             Oxygen Initial Assessment:  Oxygen Initial Assessment - 10/14/23 1431       Home Oxygen   Home Oxygen Device None    Sleep Oxygen Prescription CPAP   At night   Home Exercise Oxygen Prescription None    Home Resting Oxygen Prescription None    Compliance with Home Oxygen Use Yes   Compliance with CPAP use            Oxygen Re-Evaluation:  Oxygen Re-Evaluation     Row Name 10/27/23 1423 12/08/23 1407           Program Oxygen Prescription   Program Oxygen Prescription None None        Home Oxygen   Home Oxygen Device None None      Sleep Oxygen Prescription CPAP CPAP      Home Exercise Oxygen Prescription None None      Home Resting Oxygen Prescription None None      Compliance with Home Oxygen Use Yes Yes        Goals/Expected Outcomes   Short Term Goals To learn and understand importance of monitoring SPO2 with pulse oximeter and demonstrate accurate use of the pulse oximeter.;To learn and understand importance of maintaining oxygen saturations>88%;To learn and demonstrate proper pursed lip breathing techniques or other breathing techniques. ;To learn and demonstrate proper use of respiratory medications To learn and understand  importance of monitoring SPO2 with pulse oximeter and demonstrate accurate use of the pulse oximeter.;To learn and understand importance of maintaining oxygen saturations>88%;To learn and demonstrate proper pursed lip breathing techniques or other breathing techniques. ;To learn and demonstrate proper use of respiratory medications      Long  Term Goals Verbalizes importance of monitoring SPO2 with pulse oximeter and return demonstration;Exhibits proper breathing techniques, such as pursed lip breathing or other method taught during program session;Demonstrates proper use of MDI's;Compliance with respiratory medication;Maintenance of O2 saturations>88% Verbalizes importance of monitoring SPO2 with pulse oximeter and return demonstration;Exhibits proper breathing techniques, such as pursed lip breathing or other method taught during program session;Demonstrates proper use of MDI's;Compliance with respiratory medication;Maintenance of O2 saturations>88%      Comments Patient is taking  his prescribed medications as he should. States breathing has gotten a little better since starting the program as well. Diar states that his breathing kind of waxes and wanes. It is good for the most part but that it gets congested and harder to breathe when the weather changes.      Goals/Expected Outcomes Short: Continue attending rehab and compliance with meds. Long: Increase exercise independance at home once program is finished. Short: Continue attending rehab and compliance with meds. Long: Increase exercise independance at home once program is finished.               Oxygen Discharge (Final Oxygen Re-Evaluation):  Oxygen Re-Evaluation - 12/08/23 1407       Program Oxygen Prescription   Program Oxygen Prescription None      Home Oxygen   Home Oxygen Device None    Sleep Oxygen Prescription CPAP    Home Exercise Oxygen Prescription None    Home Resting Oxygen Prescription None    Compliance with Home Oxygen  Use Yes      Goals/Expected Outcomes   Short Term Goals To learn and understand importance of monitoring SPO2 with pulse oximeter and demonstrate accurate use of the pulse oximeter.;To learn and understand importance of maintaining oxygen saturations>88%;To learn and demonstrate proper pursed lip breathing techniques or other breathing techniques. ;To learn and demonstrate proper use of respiratory medications    Long  Term Goals Verbalizes importance of monitoring SPO2 with pulse oximeter and return demonstration;Exhibits proper breathing techniques, such as pursed lip breathing or other method taught during program session;Demonstrates proper use of MDI's;Compliance with respiratory medication;Maintenance of O2 saturations>88%    Comments Akshat states that his breathing kind of waxes and wanes. It is good for the most part but that it gets congested and harder to breathe when the weather changes.    Goals/Expected Outcomes Short: Continue attending rehab and compliance with meds. Long: Increase exercise independance at home once program is finished.             Initial Exercise Prescription:  Initial Exercise Prescription - 10/14/23 1500       Date of Initial Exercise RX and Referring Provider   Date 10/14/23    Referring Provider Vernestine Gondola MD      Oxygen   Maintain Oxygen Saturation 88% or higher      Treadmill   MPH 1.5    Grade 0    Minutes 15    METs 1.9      REL-XR   Level 1    Speed 50    Minutes 15    METs 1.8      Prescription Details   Frequency (times per week) 2    Duration Progress to 30 minutes of continuous aerobic without signs/symptoms of physical distress      Intensity   THRR 40-80% of Max Heartrate 102/131    Ratings of Perceived Exertion 11-13    Perceived Dyspnea 0-4      Resistance Training   Training Prescription Yes    Weight 4    Reps 10-15             Perform Capillary Blood Glucose checks as needed.  Exercise Prescription  Changes:   Exercise Prescription Changes     Row Name 10/14/23 1500 10/27/23 1500 11/10/23 1500 12/03/23 1500       Response to Exercise   Blood Pressure (Admit) 130/80 128/80 152/58 128/72    Blood Pressure (Exercise) 142/80 130/80 156/70 --  Blood Pressure (Exit) 132/80 112/60 118/60 130/80    Heart Rate (Admit) 73 bpm 68 bpm 66 bpm 68 bpm    Heart Rate (Exercise) 150 bpm 108 bpm 110 bpm 97 bpm    Heart Rate (Exit) 86 bpm 87 bpm 78 bpm 86 bpm    Oxygen Saturation (Admit) 92 % 93 % 96 % 95 %    Oxygen Saturation (Exercise) 86 % 92 % 90 % 90 %    Oxygen Saturation (Exit) 93 % 94 % 93 % 94 %    Rating of Perceived Exertion (Exercise) 13 16 13 14     Perceived Dyspnea (Exercise) 2 2 3 3     Duration Continue with 30 min of aerobic exercise without signs/symptoms of physical distress. Continue with 30 min of aerobic exercise without signs/symptoms of physical distress. Continue with 30 min of aerobic exercise without signs/symptoms of physical distress. Continue with 30 min of aerobic exercise without signs/symptoms of physical distress.    Intensity THRR unchanged THRR unchanged THRR unchanged THRR unchanged      Progression   Progression Continue to progress workloads to maintain intensity without signs/symptoms of physical distress. Continue to progress workloads to maintain intensity without signs/symptoms of physical distress. Continue to progress workloads to maintain intensity without signs/symptoms of physical distress. Continue to progress workloads to maintain intensity without signs/symptoms of physical distress.      Resistance Training   Training Prescription -- Yes Yes Yes    Weight -- 4 5 5     Reps -- 10-15 10-15 10-15      Treadmill   MPH -- 1.5 1.6 1.5    Grade -- 0.5 0.5 1    Minutes -- 15 15 15     METs -- 2.25 2.34 2.35      REL-XR   Level -- 2 2 2     Speed -- 46 55 56    Minutes -- 15 15 15     METs -- 3.4 4 3.1             Exercise Comments:    Exercise Comments     Row Name 10/15/23 1409           Exercise Comments First full day of exercise!  Patient was oriented to gym and equipment including functions, settings, policies, and procedures.  Patient's individual exercise prescription and treatment plan were reviewed.  All starting workloads were established based on the results of the 6 minute walk test done at initial orientation visit.  The plan for exercise progression was also introduced and progression will be customized based on patient's performance and goals.                Exercise Goals and Review:   Exercise Goals     Row Name 10/14/23 1514             Exercise Goals   Increase Physical Activity Yes       Intervention Provide advice, education, support and counseling about physical activity/exercise needs.;Develop an individualized exercise prescription for aerobic and resistive training based on initial evaluation findings, risk stratification, comorbidities and participant's personal goals.       Expected Outcomes Short Term: Attend rehab on a regular basis to increase amount of physical activity.;Long Term: Add in home exercise to make exercise part of routine and to increase amount of physical activity.;Long Term: Exercising regularly at least 3-5 days a week.       Increase Strength and Stamina Yes  Intervention Provide advice, education, support and counseling about physical activity/exercise needs.;Develop an individualized exercise prescription for aerobic and resistive training based on initial evaluation findings, risk stratification, comorbidities and participant's personal goals.       Expected Outcomes Short Term: Increase workloads from initial exercise prescription for resistance, speed, and METs.;Short Term: Perform resistance training exercises routinely during rehab and add in resistance training at home;Long Term: Improve cardiorespiratory fitness, muscular endurance and strength as  measured by increased METs and functional capacity ( )       Able to understand and use rate of perceived exertion (RPE) scale Yes       Intervention Provide education and explanation on how to use RPE scale       Expected Outcomes Short Term: Able to use RPE daily in rehab to express subjective intensity level;Long Term:  Able to use RPE to guide intensity level when exercising independently       Able to understand and use Dyspnea scale Yes       Intervention Provide education and explanation on how to use Dyspnea scale       Expected Outcomes Short Term: Able to use Dyspnea scale daily in rehab to express subjective sense of shortness of breath during exertion;Long Term: Able to use Dyspnea scale to guide intensity level when exercising independently       Knowledge and understanding of Target Heart Rate Range (THRR) Yes       Intervention Provide education and explanation of THRR including how the numbers were predicted and where they are located for reference       Expected Outcomes Short Term: Able to use daily as guideline for intensity in rehab;Short Term: Able to state/look up THRR;Long Term: Able to use THRR to govern intensity when exercising independently       Able to check pulse independently Yes       Intervention Provide education and demonstration on how to check pulse in carotid and radial arteries.;Review the importance of being able to check your own pulse for safety during independent exercise       Expected Outcomes Short Term: Able to explain why pulse checking is important during independent exercise;Long Term: Able to check pulse independently and accurately       Understanding of Exercise Prescription Yes       Intervention Provide education, explanation, and written materials on patient's individual exercise prescription       Expected Outcomes Short Term: Able to explain program exercise prescription;Long Term: Able to explain home exercise prescription to exercise  independently                Exercise Goals Re-Evaluation :  Exercise Goals Re-Evaluation     Row Name 10/15/23 1410 10/27/23 1421 10/28/23 0833 11/12/23 0912 12/08/23 1408     Exercise Goal Re-Evaluation   Exercise Goals Review -- Increase Physical Activity;Increase Strength and Stamina;Able to understand and use Dyspnea scale;Able to understand and use rate of perceived exertion (RPE) scale;Able to check pulse independently Increase Physical Activity;Increase Strength and Stamina;Understanding of Exercise Prescription Increase Physical Activity;Increase Strength and Stamina;Understanding of Exercise Prescription Increase Physical Activity;Increase Strength and Stamina;Able to understand and use rate of perceived exertion (RPE) scale;Able to understand and use Dyspnea scale;Able to check pulse independently;Knowledge and understanding of Target Heart Rate Range (THRR);Understanding of Exercise Prescription   Comments Reviewed RPE and dyspnea scale, THR and program prescription with pt today.  Pt voiced understanding and was given a copy of goals to  take home. Zebastian says he doesn't do much exercise outside the program. He does minimal walking outside of here, but not much. Zen is doing well in rehab and is on his 5th visit. He has increased his grade on the treadmill to 0.5 and has also increased his level on the XR to 2. WIll continue to montior and progress asable. Darral continues to do well in rehab, he is on his 9th visit. He has increads his walking speed to 1.6 on the treadmill with a 0.5 grade and is completing the whole 15 min on the treadmill. Will continue to monitor and progress as able. Frederic states he does a little bit of walking at home, yet most of his exercise comes from here at the program.   Expected Outcomes Short: Use RPE daily to regulate intensity.  Long: Follow program prescription in THR. Short: Continue to attend rehab. Long term: Start exercising at home. Continue to  attend rehab Continue to attend rehab Short: Continue to attend rehab. Long: Incorporate more home exercise into his routine.            Discharge Exercise Prescription (Final Exercise Prescription Changes):  Exercise Prescription Changes - 12/03/23 1500       Response to Exercise   Blood Pressure (Admit) 128/72    Blood Pressure (Exit) 130/80    Heart Rate (Admit) 68 bpm    Heart Rate (Exercise) 97 bpm    Heart Rate (Exit) 86 bpm    Oxygen Saturation (Admit) 95 %    Oxygen Saturation (Exercise) 90 %    Oxygen Saturation (Exit) 94 %    Rating of Perceived Exertion (Exercise) 14    Perceived Dyspnea (Exercise) 3    Duration Continue with 30 min of aerobic exercise without signs/symptoms of physical distress.    Intensity THRR unchanged      Progression   Progression Continue to progress workloads to maintain intensity without signs/symptoms of physical distress.      Resistance Training   Training Prescription Yes    Weight 5    Reps 10-15      Treadmill   MPH 1.5    Grade 1    Minutes 15    METs 2.35      REL-XR   Level 2    Speed 56    Minutes 15    METs 3.1             Nutrition:  Target Goals: Understanding of nutrition guidelines, daily intake of sodium 1500mg , cholesterol 200mg , calories 30% from fat and 7% or less from saturated fats, daily to have 5 or more servings of fruits and vegetables.  Biometrics:  Pre Biometrics - 10/14/23 1515       Pre Biometrics   Height 5\' 7"  (1.702 m)    Weight 233 lb 0.4 oz (105.7 kg)    Waist Circumference 50 inches    Hip Circumference 41 inches    Waist to Hip Ratio 1.22 %    BMI (Calculated) 36.49    Grip Strength 26.7 kg    Single Leg Stand 4 seconds              Nutrition Therapy Plan and Nutrition Goals:   Nutrition Assessments:  Nutrition Assessments - 10/14/23 1517       Rate Your Plate Scores   Pre Score 0.89            MEDIFICTS Score Key: >=70 Need to make dietary changes   40-70  Heart Healthy Diet <= 40 Therapeutic Level Cholesterol Diet  Flowsheet Row PULMONARY REHAB COPD ORIENTATION from 10/14/2023 in [facility] CARDIAC REHABILITATION  Picture Your Plate Total Score on Admission 77      Picture Your Plate Scores: <29 Unhealthy dietary pattern with much room for improvement. 41-50 Dietary pattern unlikely to meet recommendations for good health and room for improvement. 51-60 More healthful dietary pattern, with some room for improvement.  >60 Healthy dietary pattern, although there may be some specific behaviors that could be improved.    Nutrition Goals Re-Evaluation:  Nutrition Goals Re-Evaluation     Row Name 10/27/23 1416 12/08/23 1404           Goals   Current Weight -- 232 lb (105.2 kg)      Nutrition Goal Continue to attend rehab and eat healthy. Continue to attend rehab and eat healthy.      Comment Tam says him and his partner overall try to eat a healthy diet. They did have a cheat weekend at the [facility], but they split the cheesecake. Kerney states him and his spouse have had a complete change of how they eat. His spouse enrolled in [place] [facility], so they are teaching them portion control. They also fast for 16 hours as part of their diet.      Expected Outcome Short: Eat healthy. Long term: Lose some weight and work on portion control. Short: Continue to eat healthy. Long: Him and his spouse achieve some weight loss.               Nutrition Goals Discharge (Final Nutrition Goals Re-Evaluation):  Nutrition Goals Re-Evaluation - 12/08/23 1404       Goals   Current Weight 232 lb (105.2 kg)    Nutrition Goal Continue to attend rehab and eat healthy.    Comment Jacquelyn states him and his spouse have had a complete change of how they eat. His spouse enrolled in [place] [facility], so they are teaching them portion control. They also fast for 16 hours as part of their diet.    Expected Outcome Short:  Continue to eat healthy. Long: Him and his spouse achieve some weight loss.             Psychosocial: Target Goals: Acknowledge presence or absence of significant depression and/or stress, maximize coping skills, provide positive support system. Participant is able to verbalize types and ability to use techniques and skills needed for reducing stress and depression.  Initial Review & Psychosocial Screening:  Initial Psych Review & Screening - 10/14/23 1432       Initial Review   Current issues with Current Sleep Concerns   Wakes up once every night- falls back asleep within the hour.     Family Dynamics   Good Support System? Yes    Comments Husband Corbin Dess is support system.      Barriers   Psychosocial barriers to participate in program There are no identifiable barriers or psychosocial needs.;The patient should benefit from training in stress management and relaxation.      Screening Interventions   Interventions Encouraged to exercise;Provide feedback about the scores to participant;To provide support and resources with identified psychosocial needs    Expected Outcomes Short Term goal: Utilizing psychosocial counselor, staff and physician to assist with identification of specific Stressors or current issues interfering with healing process. Setting desired goal for each stressor or current issue identified.;Long Term Goal: Stressors or current issues are controlled  or eliminated.;Short Term goal: Identification and review with participant of any Quality of Life or Depression concerns found by scoring the questionnaire.;Long Term goal: The participant improves quality of Life and PHQ9 Scores as seen by post scores and/or verbalization of changes             Quality of Life Scores:  Scores of 19 and below usually indicate a poorer quality of life in these areas.  A difference of  2-3 points is a clinically meaningful difference.  A difference of 2-3 points in the total score of  the Quality of Life Index has been associated with significant improvement in overall quality of life, self-image, physical symptoms, and general health in studies assessing change in quality of life.   PHQ-9: Review Flowsheet  More data exists      10/12/2023 06/19/2023 06/09/2023 05/13/2023 03/10/2023  Depression screen PHQ 2/9  Decreased Interest 0 0 0 0 0  Down, Depressed, Hopeless 0 0 0 0 0  PHQ - 2 Score 0 0 0 0 0  Altered sleeping 1 0 0 - 0  Tired, decreased energy - 0 0 - 0  Change in appetite 0 0 0 - 0  Feeling bad or failure about yourself  0 0 0 - 0  Trouble concentrating 0 0 0 - 0  Moving slowly or fidgety/restless 0 0 0 - 0  Suicidal thoughts 0 0 0 - 0  PHQ-9 Score 1 0 0 - 0  Difficult doing work/chores Not difficult at all Not difficult at all Not difficult at all - -   Interpretation of Total Score  Total Score Depression Severity:  1-4 = Minimal depression, 5-9 = Mild depression, 10-14 = Moderate depression, 15-19 = Moderately severe depression, 20-27 = Severe depression   Psychosocial Evaluation and Intervention:  Psychosocial Evaluation - 10/14/23 1524       Psychosocial Evaluation & Interventions   Interventions Encouraged to exercise with the program and follow exercise prescription;Relaxation education;Stress management education    Comments Roark was referred to this program for his diagnosis of COPD. Currently he gets short of breath with minimal activities, yet he wants to improve this to not be as short of breath as often. 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 He occasionally will get dizzy first thing in the mornings, but it resolves shortly after sitting on the side of the bed. He denies any stressors in his life, his support system consists of his signifcant other, Robin. Andrews is wanting to regain his strength and stamina and not be so short of breath as a result of this program.  No barries identified to complete the program.    Continue Psychosocial Services  Follow up required by staff             Psychosocial Re-Evaluation:  Psychosocial Re-Evaluation     Row Name 10/27/23 1414 12/08/23 1402           Psychosocial Re-Evaluation   Current issues with None Identified None Identified      Comments Derran states that his stress level is at a good level, no current life stressors. He says he is sleeping well and his support system is great. Jasun states that he currently doesn't have any stress and that he is sleeping well. He has a great support system which is his spouse.      Expected Outcomes Short: Continue to attend rehab.  Long: Continue to have as few stressors in life as possible and continue sleeping well. Short: Continue to attend rehab. Long: Continue the stress relief techniques that are working in his life.      Interventions Encouraged to attend Pulmonary Rehabilitation for the exercise Encouraged to attend Pulmonary Rehabilitation for the exercise      Continue Psychosocial Services  Follow up required by staff Follow up required by staff               Psychosocial Discharge (Final Psychosocial Re-Evaluation):  Psychosocial Re-Evaluation - 12/08/23 1402       Psychosocial Re-Evaluation   Current issues with None Identified    Comments Jakavion states that he currently doesn't have any stress and that he is sleeping well. He has a great support system which is his spouse.    Expected Outcomes Short: Continue to attend rehab. Long: Continue the stress relief techniques that are working in his life.    Interventions Encouraged to attend Pulmonary Rehabilitation for the exercise    Continue Psychosocial Services  Follow up required by staff              Education: Education Goals: Education classes will be provided on a weekly basis, covering required topics. Participant will state understanding/return demonstration of topics  presented.  Learning Barriers/Preferences:  Learning Barriers/Preferences - 10/14/23 1435       Learning Barriers/Preferences   Learning Barriers None    Learning Preferences Written Material;Video;Individual Instruction;Group Instruction             Education Topics: How Lungs Work and Diseases: - Discuss the anatomy of the lungs and diseases that can affect the lungs, such as COPD. Flowsheet Row PULMONARY REHAB CHRONIC OBSTRUCTIVE PULMONARY DISEASE from 12/03/2023 in [facility]  Date 11/19/23  Educator Hb  Instruction Review Code 1- Verbalizes Understanding       Exercise: -Discuss the importance of exercise, FITT principles of exercise, normal and abnormal responses to exercise, and how to exercise safely.   Environmental Irritants: -Discuss types of environmental irritants and how to limit exposure to environmental irritants.   Meds/Inhalers and oxygen: - Discuss respiratory medications, definition of an inhaler and oxygen, and the proper way to use an inhaler and oxygen.   Energy Saving Techniques: - Discuss methods to conserve energy and decrease shortness of breath when performing activities of daily living.    Bronchial Hygiene / Breathing Techniques: - Discuss breathing mechanics, pursed-lip breathing technique,  proper posture, effective ways to clear airways, and other functional breathing techniques   Cleaning Equipment: - Provides group verbal and written instruction about the health risks of elevated stress, cause of high stress, and healthy ways to reduce stress.   Nutrition I: Fats: - Discuss the types of cholesterol, what cholesterol does to the body, and how cholesterol levels can be controlled. Flowsheet Row PULMONARY REHAB CHRONIC OBSTRUCTIVE PULMONARY DISEASE from 12/03/2023 in [place] [facility]  Date 10/22/23  Educator [facility]  Instruction Review Code 1- Verbalizes Understanding       Nutrition II:  Labels: -Discuss the different components of food labels and how to read food labels.   Respiratory Infections: - Discuss the signs and symptoms of respiratory infections, ways to prevent respiratory infections, and the importance of seeking medical treatment when having a respiratory infection.   Stress I: Signs and Symptoms: - Discuss the causes of stress, how stress may lead to anxiety and depression, and ways to limit stress. Flowsheet Row  PULMONARY REHAB CHRONIC OBSTRUCTIVE PULMONARY DISEASE from 12/03/2023 in [place] [facility]  Date 10/29/23  Educator DJ  Instruction Review Code 1- Verbalizes Understanding       Stress II: Relaxation: -Discuss relaxation techniques to limit stress. Flowsheet Row PULMONARY REHAB CHRONIC OBSTRUCTIVE PULMONARY DISEASE from 12/03/2023 in [place] [facility]  Date 10/15/23  Educator [facility]  Instruction Review Code 1- Verbalizes Understanding       Oxygen for Home/Travel: - Discuss how to prepare for travel when on oxygen and proper ways to transport and store oxygen to ensure safety.   Knowledge Questionnaire Score:  Knowledge Questionnaire Score - 10/14/23 1541       Knowledge Questionnaire Score   Pre Score 16/18             Core Components/Risk Factors/Patient Goals at Admission:  Personal Goals and Risk Factors at Admission - 10/14/23 1519       Core Components/Risk Factors/Patient Goals on Admission    Weight Management Yes    Intervention Weight Management: Develop a combined nutrition and exercise program designed to reach desired caloric intake, while maintaining appropriate intake of nutrient and fiber, sodium and fats, and appropriate energy expenditure required for the weight goal.;Weight Management: Provide education and appropriate resources to help participant work on and attain dietary goals.;Weight Management/Obesity: Establish reasonable short term and long term weight goals.    Admit  Weight 233 lb 0.4 oz (105.7 kg)    Improve shortness of breath with ADL's Yes    Intervention Provide education, individualized exercise plan and daily activity instruction to help decrease symptoms of SOB with activities of daily living.    Expected Outcomes Short Term: Improve cardiorespiratory fitness to achieve a reduction of symptoms when performing ADLs    Increase knowledge of respiratory medications and ability to use respiratory devices properly  Yes    Intervention Provide education and demonstration as needed of appropriate use of medications, inhalers, and oxygen therapy.    Expected Outcomes Short Term: Achieves understanding of medications use. Understands that oxygen is a medication prescribed by physician. Demonstrates appropriate use of inhaler and oxygen therapy.;Long Term: Maintain appropriate use of medications, inhalers, and oxygen therapy.    Diabetes Yes    Intervention Provide education about signs/symptoms and action to take for hypo/hyperglycemia.;Provide education about proper nutrition, including hydration, and aerobic/resistive exercise prescription along with prescribed medications to achieve blood glucose in normal ranges: Fasting glucose 65-99 mg/dL    Expected Outcomes Short Term: Participant verbalizes understanding of the signs/symptoms and immediate care of hyper/hypoglycemia, proper foot care and importance of medication, aerobic/resistive exercise and nutrition plan for blood glucose control.;Long Term: Attainment of HbA1C < 7%.    Hypertension Yes    Intervention Monitor prescription use compliance.;Provide education on lifestyle modifcations including regular physical activity/exercise, weight management, moderate sodium restriction and increased consumption of fresh fruit, vegetables, and low fat dairy, alcohol moderation, and smoking cessation.    Expected Outcomes Long Term: Maintenance of blood pressure at goal levels.;Short Term: Continued assessment and  intervention until BP is < 140/42mm HG in hypertensive participants. < 130/30mm HG in hypertensive participants with diabetes, heart failure or chronic kidney disease.    Lipids Yes    Intervention Provide education and support for participant on nutrition & aerobic/resistive exercise along with prescribed medications to achieve LDL 70mg , HDL >40mg .    Expected Outcomes Short Term: Participant states understanding of desired cholesterol values and is compliant with medications prescribed. Participant is following exercise prescription  and nutrition guidelines.;Long Term: Cholesterol controlled with medications as prescribed, with individualized exercise RX and with personalized nutrition plan. Value goals: LDL < 70mg , HDL > 40 mg.             Core Components/Risk Factors/Patient Goals Review:   Goals and Risk Factor Review     Row Name 10/27/23 1418 12/08/23 1406           Core Components/Risk Factors/Patient Goals Review   Personal Goals Review Weight Management/Obesity;Increase knowledge of respiratory medications and ability to use respiratory devices properly.;Lipids Weight Management/Obesity;Increase knowledge of respiratory medications and ability to use respiratory devices properly.;Lipids;Hypertension      Review Aries said his breathing has been a little better since attending the program. He does not have a pulse oximeter to check his sat's at home but he is planning on buying one in the near future. His BP runs stable like it does here. He is taking his medications like he is supposed to. Erice states he is taking his medications as he should at home. He states he checks his pulse ox several times a day and it is in the mid 90's.      Expected Outcomes Short term: Continue to attend rehab. Long term: Breathing continue to gets better. Short term: Continue to attend rehab. Long term: Breathing continue to gets better.               Core Components/Risk Factors/Patient Goals  at Discharge (Final Review):   Goals and Risk Factor Review - 12/08/23 1406       Core Components/Risk Factors/Patient Goals Review   Personal Goals Review Weight Management/Obesity;Increase knowledge of respiratory medications and ability to use respiratory devices properly.;Lipids;Hypertension    Review Raciel states he is taking his medications as he should at home. He states he checks his pulse ox several times a day and it is in the mid 90's.    Expected Outcomes Short term: Continue to attend rehab. Long term: Breathing continue to gets better.             ITP Comments:  ITP Comments     Row Name 10/14/23 1533 10/15/23 1409 11/11/23 1145 12/09/23 1446     ITP Comments Patient attend orientation today.  Patient is attending Pulmonary Rehabilitation Program.  Documentation for diagnosis can be found in [facility]/EPIC.  Reviewed medical chart, RPE/RPD, gym safety, and program guidelines.  Patient was fitted to equipment they will be using during rehab.  Patient is scheduled to start exercise on 10/15/23.   Initial ITP created and sent for review and signature by Dr. Gwendalyn Lemma, Medical Director for Pulmonary Rehabilitation Program. First full day of exercise!  Patient was oriented to gym and equipment including functions, settings, policies, and procedures.  Patient's individual exercise prescription and treatment plan were reviewed.  All starting workloads were established based on the results of the 6 minute walk test done at initial orientation visit.  The plan for exercise progression was also introduced and progression will be customized based on patient's performance and goals. 30 day review completed. ITP sent to Dr.Jehanzeb Memon, Medical Director of  Pulmonary Rehab. Continue with ITP unless changes are made by physician. 30 day review completed. ITP sent to Dr.Jehanzeb Memon, Medical Director of  Pulmonary Rehab. Continue with ITP unless changes are made by physician.              Comments: 30 day review

## 2023-12-10 ENCOUNTER — Encounter (HOSPITAL_COMMUNITY)
Admission: RE | Admit: 2023-12-10 | Discharge: 2023-12-10 | Disposition: A | Discharge status: Home / Self Care | Payer: PPO | Source: Ambulatory Visit | Attending: Internal Medicine | Admitting: Internal Medicine

## 2023-12-10 DIAGNOSIS — J449 Chronic obstructive pulmonary disease, unspecified: Secondary | ICD-10-CM | POA: Diagnosis not present

## 2023-12-10 NOTE — Progress Notes (Signed)
 Daily Session Note  Patient Details  Name: Jerome Sanchez MRN: [id number] Date of Birth: 04/26/1951 Referring Provider:   Flowsheet Row PULMONARY REHAB COPD ORIENTATION from 10/14/2023 in [facility]  Referring Provider Vernestine Gondola MD       Encounter Date: 12/10/2023  Check In:  Session Check In - 12/10/23 1330       Check-In   Supervising physician immediately available to respond to emergencies See telemetry face sheet for immediately available MD    Location [facility]    Staff Present Rita Cherry, MA, RCEP, CCRP, CCET;Metzli Pollick Toy Freund, [place], Exercise Physiologist    Virtual Visit No    Medication changes reported     No    Fall or balance concerns reported    No    Tobacco Cessation No Change    Warm-up and Cool-down Performed on first and last piece of equipment    Resistance Training Performed Yes    VAD Patient? No    PAD/SET Patient? No      Pain Assessment   Currently in Pain? No/denies    Multiple Pain Sites No             Capillary Blood Glucose: No results found for this or any previous visit (from the past 24 hours).    Social History   Tobacco Use  Smoking Status Former   Current packs/day: 0.00   Average packs/day: 1.3 packs/day for 51.0 years (63.8 ttl pk-yrs)   Types: Cigarettes   Start date: 19   Quit date: 2018   Years since quitting: 7.3  Smokeless Tobacco Never    Goals Met:  Independence with exercise equipment Exercise tolerated well No report of concerns or symptoms today Strength training completed today  Goals Unmet:  Not Applicable  Comments: Pt able to follow exercise prescription today without complaint.  Will continue to monitor for progression.

## 2023-12-15 ENCOUNTER — Encounter (HOSPITAL_COMMUNITY)
Admission: RE | Admit: 2023-12-15 | Discharge: 2023-12-15 | Disposition: A | Discharge status: Home / Self Care | Payer: PPO | Source: Ambulatory Visit | Attending: Internal Medicine | Admitting: Internal Medicine

## 2023-12-15 DIAGNOSIS — J449 Chronic obstructive pulmonary disease, unspecified: Secondary | ICD-10-CM

## 2023-12-15 NOTE — Progress Notes (Signed)
 Daily Session Note  Patient Details  Name: Jerome Sanchez MRN: [id number] Date of Birth: 1950/10/30 Referring Provider:   Flowsheet Row PULMONARY REHAB COPD ORIENTATION from 10/14/2023 in [facility]  Referring Provider Vernestine Gondola MD       Encounter Date: 12/15/2023  Check In:  Session Check In - 12/15/23 1330       Check-In   Supervising physician immediately available to respond to emergencies See telemetry face sheet for immediately available MD    Location [facility]    Staff Present Clotilda Danish, BS, Exercise Physiologist;Brittany Annette Barters, BSN, RN, Olena Bernard, RN    Virtual Visit No    Medication changes reported     No    Fall or balance concerns reported    No    Warm-up and Cool-down Performed on first and last piece of equipment    Resistance Training Performed No    VAD Patient? No    PAD/SET Patient? No      Pain Assessment   Currently in Pain? No/denies             Capillary Blood Glucose: No results found for this or any previous visit (from the past 24 hours).    Social History   Tobacco Use  Smoking Status Former   Current packs/day: 0.00   Average packs/day: 1.3 packs/day for 51.0 years (63.8 ttl pk-yrs)   Types: Cigarettes   Start date: 73   Quit date: 2018   Years since quitting: 7.3  Smokeless Tobacco Never    Goals Met:  Independence with exercise equipment Exercise tolerated well Strength training completed today  Goals Unmet:  Not Applicable  Comments: Pt able to follow exercise prescription today without complaint.  Will continue to monitor for progression.

## 2023-12-17 ENCOUNTER — Encounter (HOSPITAL_COMMUNITY)
Admission: RE | Admit: 2023-12-17 | Discharge: 2023-12-17 | Disposition: A | Discharge status: Home / Self Care | Payer: PPO | Source: Ambulatory Visit | Attending: Internal Medicine | Admitting: Internal Medicine

## 2023-12-17 DIAGNOSIS — J449 Chronic obstructive pulmonary disease, unspecified: Secondary | ICD-10-CM | POA: Insufficient documentation

## 2023-12-17 NOTE — Progress Notes (Signed)
 Daily Session Note  Patient Details  Name: Jerome Sanchez MRN: [id number] Date of Birth: 1951/03/07 Referring Provider:   Flowsheet Row PULMONARY REHAB COPD ORIENTATION from 10/14/2023 in [facility]  Referring Provider Vernestine Gondola MD       Encounter Date: 12/17/2023  Check In:  Session Check In - 12/17/23 1315       Check-In   Supervising physician immediately available to respond to emergencies See telemetry face sheet for immediately available MD    Location [facility]    Staff Present Doug Gehrig, RN, BSN;Heather Toy Freund, BS, Exercise Physiologist    Virtual Visit No    Medication changes reported     No    Fall or balance concerns reported    No    Warm-up and Cool-down Performed on first and last piece of equipment    Resistance Training Performed Yes    VAD Patient? No    PAD/SET Patient? No      Pain Assessment   Currently in Pain? No/denies    Multiple Pain Sites No             Capillary Blood Glucose: No results found for this or any previous visit (from the past 24 hours).    Social History   Tobacco Use  Smoking Status Former   Current packs/day: 0.00   Average packs/day: 1.3 packs/day for 51.0 years (63.8 ttl pk-yrs)   Types: Cigarettes   Start date: 4   Quit date: 2018   Years since quitting: 7.3  Smokeless Tobacco Never    Goals Met:  Proper associated with RPD/PD & O2 Sat Independence with exercise equipment Using PLB without cueing & demonstrates good technique Exercise tolerated well No report of concerns or symptoms today Strength training completed today  Goals Unmet:  Not Applicable  Comments: Pt able to follow exercise prescription today without complaint.  Will continue to monitor for progression.

## 2023-12-22 ENCOUNTER — Encounter (HOSPITAL_COMMUNITY)
Admission: RE | Admit: 2023-12-22 | Discharge: 2023-12-22 | Disposition: A | Discharge status: Home / Self Care | Payer: PPO | Source: Ambulatory Visit | Attending: Internal Medicine | Admitting: Internal Medicine

## 2023-12-22 DIAGNOSIS — J449 Chronic obstructive pulmonary disease, unspecified: Secondary | ICD-10-CM

## 2023-12-22 NOTE — Progress Notes (Signed)
 Daily Session Note  Patient Details  Name: Jerome Sanchez MRN: [id number] Date of Birth: 11-Dec-1950 Referring Provider:   Flowsheet Row PULMONARY REHAB COPD ORIENTATION from 10/14/2023 in [facility]  Referring Provider Vernestine Gondola MD       Encounter Date: 12/22/2023  Check In:  Session Check In - 12/22/23 1337       Check-In   Supervising physician immediately available to respond to emergencies See telemetry face sheet for immediately available MD    Location [facility]    Staff Present Jerrol Morelle, BSN, RN, WTA-C;Heather Toy Freund, BS, Exercise Physiologist;Brooke Rollin Clock, RN    Virtual Visit No    Medication changes reported     No    Fall or balance concerns reported    No    Tobacco Cessation No Change    Warm-up and Cool-down Performed on first and last piece of equipment    Resistance Training Performed Yes    VAD Patient? No    PAD/SET Patient? No      Pain Assessment   Currently in Pain? No/denies             Capillary Blood Glucose: No results found for this or any previous visit (from the past 24 hours).    Social History   Tobacco Use  Smoking Status Former   Current packs/day: 0.00   Average packs/day: 1.3 packs/day for 51.0 years (63.8 ttl pk-yrs)   Types: Cigarettes   Start date: 6   Quit date: 2018   Years since quitting: 7.3  Smokeless Tobacco Never    Goals Met:  Proper associated with RPD/PD & O2 Sat Independence with exercise equipment Improved SOB with ADL's Using PLB without cueing & demonstrates good technique Exercise tolerated well No report of concerns or symptoms today Strength training completed today  Goals Unmet:  Not Applicable  Comments: Pt able to follow exercise prescription today without complaint.  Will continue to monitor for progression.

## 2023-12-24 ENCOUNTER — Encounter (HOSPITAL_COMMUNITY)
Admission: RE | Admit: 2023-12-24 | Discharge: 2023-12-24 | Disposition: A | Discharge status: Home / Self Care | Payer: PPO | Source: Ambulatory Visit | Attending: Internal Medicine | Admitting: Internal Medicine

## 2023-12-24 DIAGNOSIS — J449 Chronic obstructive pulmonary disease, unspecified: Secondary | ICD-10-CM | POA: Diagnosis not present

## 2023-12-24 NOTE — Progress Notes (Signed)
 Daily Session Note  Patient Details  Name: ZO DINOVO MRN: [id number] Date of Birth: 10-16-1950 Referring Provider:   Flowsheet Row PULMONARY REHAB COPD ORIENTATION from 10/14/2023 in [facility] REH[facility]  Referring Provider Vernestine Gondola MD       Encounter Date: 12/24/2023  Check In:  Session Check In - 12/24/23 1355       Check-In   Supervising physician immediately available to respond to emergencies See telemetry face sheet for immediately available MD    Location [facility]    Staff Present Ronna Coho BSN, RN;Heather Toy Freund, [place], Exercise Physiologist;Brooke Rollin Clock, RN    Virtual Visit No    Medication changes reported     No    Fall or balance concerns reported    No    Warm-up and Cool-down Performed on first and last piece of equipment    Resistance Training Performed Yes    VAD Patient? No    PAD/SET Patient? No      Pain Assessment   Currently in Pain? No/denies             Capillary Blood Glucose: No results found for this or any previous visit (from the past 24 hours).    Social History   Tobacco Use  Smoking Status Former   Current packs/day: 0.00   Average packs/day: 1.3 packs/day for 51.0 years (63.8 ttl pk-yrs)   Types: Cigarettes   Start date: 37   Quit date: 2018   Years since quitting: 7.3  Smokeless Tobacco Never    Goals Met:  Proper associated with RPD/PD & O2 Sat Independence with exercise equipment Using PLB without cueing & demonstrates good technique Exercise tolerated well No report of concerns or symptoms today Strength training completed today  Goals Unmet:  Not Applicable  Comments: Pt able to follow exercise prescription today without complaint.  Will continue to monitor for progression.

## 2023-12-29 ENCOUNTER — Encounter (HOSPITAL_COMMUNITY): Payer: PPO

## 2023-12-31 ENCOUNTER — Encounter (HOSPITAL_COMMUNITY)
Admission: RE | Admit: 2023-12-31 | Discharge: 2023-12-31 | Disposition: A | Discharge status: Home / Self Care | Payer: PPO | Source: Ambulatory Visit | Attending: Internal Medicine | Admitting: Internal Medicine

## 2023-12-31 DIAGNOSIS — J449 Chronic obstructive pulmonary disease, unspecified: Secondary | ICD-10-CM | POA: Diagnosis not present

## 2023-12-31 NOTE — Progress Notes (Signed)
 Daily Session Note  Patient Details  Name: Jerome Sanchez MRN: [id number] Date of Birth: [birth year] Referring Provider:   Flowsheet Row PULMONARY REHAB COPD ORIENTATION from 10/14/2023 in [facility] CARDIAC REHABILITATION  Referring Provider Jerome Gondola MD       Encounter Date: 12/31/2023  Check In:  Session Check In - 12/31/23 1315       Check-In   Supervising physician immediately available to respond to emergencies See telemetry face sheet for immediately available MD    Location [facility]    Staff Present Jerome Sanchez BSN, RN;Jerome Sanchez, BS, Exercise Physiologist;Jerome Sanchez, [place], RCEP, CCRP, CCET    Virtual Visit No    Medication changes reported     No    Fall or balance concerns reported    No    Tobacco Cessation No Change    Warm-up and Cool-down Performed on first and last piece of equipment    Resistance Training Performed Yes    VAD Patient? No    PAD/SET Patient? No      Pain Assessment   Currently in Pain? No/denies    Multiple Pain Sites No             Capillary Blood Glucose: No results found for this or any previous visit (from the past 24 hours).    Social History   Tobacco Use  Smoking Status Former   Current packs/day: 0.00   Average packs/day: 1.3 packs/day for 51.0 years (63.8 ttl pk-yrs)   Types: Cigarettes   Start date: 67   Quit date: 2018   Years since quitting: 7.3  Smokeless Tobacco Never    Goals Met:  Proper associated with RPD/PD & O2 Sat Independence with exercise equipment Using PLB without cueing & demonstrates good technique Exercise tolerated well Queuing for purse lip breathing No report of concerns or symptoms today Strength training completed today  Goals Unmet:  Not Applicable  Comments: Jerome AasAaron AasPt able to follow exercise prescription today without complaint.  Will continue to monitor for progression.

## 2024-01-05 ENCOUNTER — Encounter (HOSPITAL_COMMUNITY)
Admission: RE | Admit: 2024-01-05 | Discharge: 2024-01-05 | Disposition: A | Discharge status: Home / Self Care | Payer: PPO | Source: Ambulatory Visit | Attending: Internal Medicine | Admitting: Internal Medicine

## 2024-01-05 DIAGNOSIS — J449 Chronic obstructive pulmonary disease, unspecified: Secondary | ICD-10-CM

## 2024-01-05 NOTE — Progress Notes (Signed)
 Daily Session Note  Patient Details  Name: Jerome Sanchez MRN: [id number] Date of Birth: 08-Mar-1951 Referring Provider:   Flowsheet Row PULMONARY REHAB COPD ORIENTATION from 10/14/2023 in [facility] CARDIAC REHABILITATION  Referring Provider Vernestine Gondola MD       Encounter Date: 01/05/2024  Check In:  Session Check In - 01/05/24 1330       Check-In   Supervising physician immediately available to respond to emergencies See telemetry face sheet for immediately available MD    Location [facility]    Staff Present Clotilda Danish, BS, Exercise Physiologist;Brittany Annette Barters, BSN, RN, WTA-C;Murline Weigel, RN;Jessica Emmett, MA, RCEP, CCRP, CCET    Virtual Visit No    Medication changes reported     No    Fall or balance concerns reported    No    Warm-up and Cool-down Performed on first and last piece of equipment    Resistance Training Performed Yes    VAD Patient? No    PAD/SET Patient? No      Pain Assessment   Currently in Pain? No/denies    Multiple Pain Sites No             Capillary Blood Glucose: No results found for this or any previous visit (from the past 24 hours).    Social History   Tobacco Use  Smoking Status Former   Current packs/day: 0.00   Average packs/day: 1.3 packs/day for 51.0 years (63.8 ttl pk-yrs)   Types: Cigarettes   Start date: 76   Quit date: 2018   Years since quitting: 7.3  Smokeless Tobacco Never    Goals Met:  Proper associated with RPD/PD & O2 Sat Independence with exercise equipment Using PLB without cueing & demonstrates good technique Exercise tolerated well No report of concerns or symptoms today Strength training completed today  Goals Unmet:  Not Applicable  Comments: Pt able to follow exercise prescription today without complaint.  Will continue to monitor for progression.

## 2024-01-06 ENCOUNTER — Encounter (HOSPITAL_COMMUNITY): Payer: Self-pay | Admitting: *Deleted

## 2024-01-06 DIAGNOSIS — J449 Chronic obstructive pulmonary disease, unspecified: Secondary | ICD-10-CM

## 2024-01-06 DIAGNOSIS — G4733 Obstructive sleep apnea (adult) (pediatric): Secondary | ICD-10-CM | POA: Diagnosis not present

## 2024-01-06 NOTE — Progress Notes (Signed)
 Pulmonary Individual Treatment Plan  Patient Details  Name: Jerome Sanchez MRN: [id number] Date of Birth: November 26, 1950 Referring Provider:   Flowsheet Row PULMONARY REHAB COPD ORIENTATION from 10/14/2023 in [facility] CARDIAC REHABILITATION  Referring Provider Vernestine Gondola MD       Initial Encounter Date:  Flowsheet Row PULMONARY REHAB COPD ORIENTATION from 10/14/2023 in [facility] CARDIAC REHABILITATION  Date 10/14/23       Visit Diagnosis: Chronic obstructive pulmonary disease, unspecified COPD type (HCC)  Patient's Home Medications on Admission:   Current Outpatient Medications:    albuterol  (VENTOLIN  HFA) 108 (90 Base) MCG/ACT inhaler, INHALE 1 TO 2 PUFFS BY MOUTH EVERY 6 HOURS AS NEEDED FOR WHEEZING FOR SHORTNESS OF BREATH, Disp: 9 g, Rfl: 0   atorvastatin  (LIPITOR) 80 MG tablet, Take 1 tablet (80 mg total) by mouth daily., Disp: 100 tablet, Rfl: 2   dapagliflozin propanediol (FARXIGA) 5 MG TABS tablet, Take 5 mg by mouth daily., Disp: , Rfl:    ezetimibe  (ZETIA ) 10 MG tablet, Take 1 tablet (10 mg total) by mouth daily., Disp: 100 tablet, Rfl: 1   Fluticasone-Umeclidin-Vilant (TRELEGY ELLIPTA ) 100-62.5-25 MCG/ACT AEPB, INHALE 1 PUFF INTO LUNGS ONCE DAILY, Disp: 180 each, Rfl: 6   furosemide  (LASIX ) 40 MG tablet, Take 1 tablet (40 mg total) by mouth daily., Disp: 90 tablet, Rfl: 3   lansoprazole  (PREVACID ) 15 MG capsule, Take 1 capsule (15 mg total) by mouth as needed., Disp: 100 capsule, Rfl: 1   OVER THE COUNTER MEDICATION, Areds ( omega 3, lutein, zeaxanthin) take one a day, Disp: , Rfl:    valsartan  (DIOVAN ) 80 MG tablet, Take 1 tablet (80 mg total) by mouth daily., Disp: 100 tablet, Rfl: 3  Past Medical History: Past Medical History:  Diagnosis Date   Allergy    Arthritis    Asthma    Cardiac arrhythmia    Chronic kidney disease    COPD (chronic obstructive pulmonary disease) (HCC)    Essential hypertension, benign    GERD (gastroesophageal reflux disease)     Impaired glucose tolerance    Injury of right rotator cuff    Lumbar disc disease    Mixed hyperlipidemia    OSA (obstructive sleep apnea)    Sleep apnea    Type 2 diabetes mellitus (HCC)    Vitamin D deficiency     Tobacco Use: Social History   Tobacco Use  Smoking Status Former   Current packs/day: 0.00   Average packs/day: 1.3 packs/day for 51.0 years (63.8 ttl pk-yrs)   Types: Cigarettes   Start date: 62   Quit date: 2018   Years since quitting: 7.3  Smokeless Tobacco Never    Labs: Review Flowsheet  More data exists      Latest Ref Rng & Units 08/22/2021 03/05/2022 07/15/2022 06/22/2023 10/02/2023  Labs for ITP Cardiac and Pulmonary Rehab  Cholestrol 100 - 199 mg/dL 119  147  829  562  130   LDL (calc) 0 - 99 mg/dL 865  784  74  67  71   HDL-C >39 mg/dL 79  84  94  79  85   Trlycerides 0 - 149 mg/dL 696  76  81  62  72   Hemoglobin A1c 4.8 - 5.6 % 6.1  6.0  - 6.6  6.7     Capillary Blood Glucose: No results found for: "GLUCAP"   Pulmonary Assessment Scores:  Pulmonary Assessment Scores     Row Name 10/14/23 [phone]  ADL UCSD   ADL Phase Entry     SOB Score total 33     Rest 0     Walk 2     Stairs 3     Bath 1     Dress 0     Shop 1       CAT Score   CAT Score 14             UCSD: Self-administered rating of dyspnea associated with activities of daily living (ADLs) 6-point scale (0 = "not at all" to 5 = "maximal or unable to do because of breathlessness")  Scoring Scores range from 0 to 120.  Minimally important difference is 5 units  CAT: CAT can identify the health impairment of COPD patients and is better correlated with disease progression.  CAT has a scoring range of zero to 40. The CAT score is classified into four groups of low (less than 10), medium (10 - 20), high (21-30) and very high (31-40) based on the impact level of disease on health status. A CAT score over 10 suggests significant symptoms.  A worsening CAT score could be  explained by an exacerbation, poor medication adherence, poor inhaler technique, or progression of COPD or comorbid conditions.  CAT MCID is 2 points  mMRC: mMRC (Modified Medical Research Council) Dyspnea Scale is used to assess the degree of baseline functional disability in patients of respiratory disease due to dyspnea. No minimal important difference is established. A decrease in score of 1 point or greater is considered a positive change.   Pulmonary Function Assessment:   Exercise Target Goals: Exercise Program Goal: Individual exercise prescription set using results from initial 6 min walk test and THRR while considering  patient's activity barriers and safety.   Exercise Prescription Goal: Initial exercise prescription builds to 30-45 minutes a day of aerobic activity, 2-3 days per week.  Home exercise guidelines will be given to patient during program as part of exercise prescription that the participant will acknowledge.  Activity Barriers & Risk Stratification:  Activity Barriers & Cardiac Risk Stratification - 10/14/23 1426       Activity Barriers & Cardiac Risk Stratification   Activity Barriers Arthritis;Back Problems;Balance Concerns;Shortness of Breath   Arthritis in both shoulders, Back pain due to a crushed disc.            6 Minute Walk:  6 Minute Walk     Row Name 10/14/23 1510 10/14/23 1511       6 Minute Walk   Phase Initial --    Distance 1200 feet --    Walk Time 6 minutes --    # of Rest Breaks 0 --    MPH 2.27 --    METS 2.6 --    RPE 13 --    Perceived Dyspnea  2 --    VO2 Peak 9.1 --    Symptoms No --    Resting HR 73 bpm --    Resting BP 130/80 --    Resting Oxygen Saturation  92 % --    Exercise Oxygen Saturation  during 6 min walk 96 % --    Max Ex. HR 150 bpm --    Max Ex. BP 142/80 --    2 Minute Post BP 132/80 --      Interval HR   1 Minute HR 133 --    2 Minute HR 140 --    3 Minute HR 146 --    4 Minute  HR 140 --    5  Minute HR 143 --    6 Minute HR 150 --    2 Minute Post HR 86 --    Interval Heart Rate? Yes --      Interval Oxygen   Interval Oxygen? -- Yes    Baseline Oxygen Saturation % -- 92 %    1 Minute Oxygen Saturation % -- 88 %    1 Minute Liters of Oxygen -- 0 L    2 Minute Oxygen Saturation % -- 86 %    2 Minute Liters of Oxygen -- 0 L    3 Minute Oxygen Saturation % -- 86 %    3 Minute Liters of Oxygen -- 0 L    4 Minute Oxygen Saturation % -- 86 %    4 Minute Liters of Oxygen -- 0 L    5 Minute Oxygen Saturation % -- 87 %    5 Minute Liters of Oxygen -- 0 L    6 Minute Oxygen Saturation % -- 87 %    6 Minute Liters of Oxygen -- 0 L    2 Minute Post Oxygen Saturation % -- 93 %    2 Minute Post Liters of Oxygen -- 0 L             Oxygen Initial Assessment:  Oxygen Initial Assessment - 10/14/23 1431       Home Oxygen   Home Oxygen Device None    Sleep Oxygen Prescription CPAP   At night   Home Exercise Oxygen Prescription None    Home Resting Oxygen Prescription None    Compliance with Home Oxygen Use Yes   Compliance with CPAP use            Oxygen Re-Evaluation:  Oxygen Re-Evaluation     Row Name 10/27/23 1423 12/08/23 1407 01/01/24 1944         Program Oxygen Prescription   Program Oxygen Prescription None None None       Home Oxygen   Home Oxygen Device None None None     Sleep Oxygen Prescription CPAP CPAP CPAP     Home Exercise Oxygen Prescription None None None     Home Resting Oxygen Prescription None None None     Compliance with Home Oxygen Use Yes Yes Yes       Goals/Expected Outcomes   Short Term Goals To learn and understand importance of monitoring SPO2 with pulse oximeter and demonstrate accurate use of the pulse oximeter.;To learn and understand importance of maintaining oxygen saturations>88%;To learn and demonstrate proper pursed lip breathing techniques or other breathing techniques. ;To learn and demonstrate proper use of respiratory  medications To learn and understand importance of monitoring SPO2 with pulse oximeter and demonstrate accurate use of the pulse oximeter.;To learn and understand importance of maintaining oxygen saturations>88%;To learn and demonstrate proper pursed lip breathing techniques or other breathing techniques. ;To learn and demonstrate proper use of respiratory medications To learn and understand importance of monitoring SPO2 with pulse oximeter and demonstrate accurate use of the pulse oximeter.;To learn and understand importance of maintaining oxygen saturations>88%;To learn and demonstrate proper pursed lip breathing techniques or other breathing techniques. ;To learn and demonstrate proper use of respiratory medications     Long  Term Goals Verbalizes importance of monitoring SPO2 with pulse oximeter and return demonstration;Exhibits proper breathing techniques, such as pursed lip breathing or other method taught during program session;Demonstrates proper use of MDI's;Compliance with respiratory medication;Maintenance of  O2 saturations>88% Verbalizes importance of monitoring SPO2 with pulse oximeter and return demonstration;Exhibits proper breathing techniques, such as pursed lip breathing or other method taught during program session;Demonstrates proper use of MDI's;Compliance with respiratory medication;Maintenance of O2 saturations>88% Verbalizes importance of monitoring SPO2 with pulse oximeter and return demonstration;Exhibits proper breathing techniques, such as pursed lip breathing or other method taught during program session;Demonstrates proper use of MDI's;Compliance with respiratory medication;Maintenance of O2 saturations>88%     Comments Patient is taking his prescribed medications as he should. States breathing has gotten a little better since starting the program as well. Lynell states that his breathing kind of waxes and wanes. It is good for the most part but that it gets congested and harder to  breathe when the weather changes. Imir is doing well in rehab.  His breathing has been getting a little better.  He is using his PLB.  He is compliant with his CPAP.     Goals/Expected Outcomes Short: Continue attending rehab and compliance with meds. Long: Increase exercise independance at home once program is finished. Short: Continue attending rehab and compliance with meds. Long: Increase exercise independance at home once program is finished. Short: COnitnue to monitor breathing Long; Conitnued compliance              Oxygen Discharge (Final Oxygen Re-Evaluation):  Oxygen Re-Evaluation - 01/01/24 1944       Program Oxygen Prescription   Program Oxygen Prescription None      Home Oxygen   Home Oxygen Device None    Sleep Oxygen Prescription CPAP    Home Exercise Oxygen Prescription None    Home Resting Oxygen Prescription None    Compliance with Home Oxygen Use Yes      Goals/Expected Outcomes   Short Term Goals To learn and understand importance of monitoring SPO2 with pulse oximeter and demonstrate accurate use of the pulse oximeter.;To learn and understand importance of maintaining oxygen saturations>88%;To learn and demonstrate proper pursed lip breathing techniques or other breathing techniques. ;To learn and demonstrate proper use of respiratory medications    Long  Term Goals Verbalizes importance of monitoring SPO2 with pulse oximeter and return demonstration;Exhibits proper breathing techniques, such as pursed lip breathing or other method taught during program session;Demonstrates proper use of MDI's;Compliance with respiratory medication;Maintenance of O2 saturations>88%    Comments Jaysen is doing well in rehab.  His breathing has been getting a little better.  He is using his PLB.  He is compliant with his CPAP.    Goals/Expected Outcomes Short: COnitnue to monitor breathing Long; Conitnued compliance             Initial Exercise Prescription:  Initial Exercise  Prescription - 10/14/23 1500       Date of Initial Exercise RX and Referring Provider   Date 10/14/23    Referring Provider Vernestine Gondola MD      Oxygen   Maintain Oxygen Saturation 88% or higher      Treadmill   MPH 1.5    Grade 0    Minutes 15    METs 1.9      REL-XR   Level 1    Speed 50    Minutes 15    METs 1.8      Prescription Details   Frequency (times per week) 2    Duration Progress to 30 minutes of continuous aerobic without signs/symptoms of physical distress      Intensity   THRR 40-80% of Max Heartrate 102/131  Ratings of Perceived Exertion 11-13    Perceived Dyspnea 0-4      Resistance Training   Training Prescription Yes    Weight 4    Reps 10-15             Perform Capillary Blood Glucose checks as needed.  Exercise Prescription Changes:   Exercise Prescription Changes     Row Name 10/14/23 1500 10/27/23 1500 11/10/23 1500 12/03/23 1500 12/22/23 1500     Response to Exercise   Blood Pressure (Admit) 130/80 128/80 152/58 128/72 118/70   Blood Pressure (Exercise) 142/80 130/80 156/70 -- --   Blood Pressure (Exit) 132/80 112/60 118/60 130/80 130/80   Heart Rate (Admit) 73 bpm 68 bpm 66 bpm 68 bpm 74 bpm   Heart Rate (Exercise) 150 bpm 108 bpm 110 bpm 97 bpm 113 bpm   Heart Rate (Exit) 86 bpm 87 bpm 78 bpm 86 bpm 92 bpm   Oxygen Saturation (Admit) 92 % 93 % 96 % 95 % 93 %   Oxygen Saturation (Exercise) 86 % 92 % 90 % 90 % 90 %   Oxygen Saturation (Exit) 93 % 94 % 93 % 94 % 92 %   Rating of Perceived Exertion (Exercise) 13 16 13 14 14    Perceived Dyspnea (Exercise) 2 2 3 3 3    Duration Continue with 30 min of aerobic exercise without signs/symptoms of physical distress. Continue with 30 min of aerobic exercise without signs/symptoms of physical distress. Continue with 30 min of aerobic exercise without signs/symptoms of physical distress. Continue with 30 min of aerobic exercise without signs/symptoms of physical distress. Continue with 30  min of aerobic exercise without signs/symptoms of physical distress.   Intensity THRR unchanged THRR unchanged THRR unchanged THRR unchanged THRR unchanged     Progression   Progression Continue to progress workloads to maintain intensity without signs/symptoms of physical distress. Continue to progress workloads to maintain intensity without signs/symptoms of physical distress. Continue to progress workloads to maintain intensity without signs/symptoms of physical distress. Continue to progress workloads to maintain intensity without signs/symptoms of physical distress. Continue to progress workloads to maintain intensity without signs/symptoms of physical distress.     Resistance Training   Training Prescription -- Yes Yes Yes Yes   Weight -- 4 5 5 5    Reps -- 10-15 10-15 10-15 10-15     Treadmill   MPH -- 1.5 1.6 1.5 1.5   Grade -- 0.5 0.5 1 1    Minutes -- 15 15 15 15    METs -- 2.25 2.34 2.35 2.35     REL-XR   Level -- 2 2 2 2    Speed -- 46 55 56 43   Minutes -- 15 15 15 15    METs -- 3.4 4 3.1 2.6            Exercise Comments:   Exercise Comments     Row Name 10/15/23 1409           Exercise Comments First full day of exercise!  Patient was oriented to gym and equipment including functions, settings, policies, and procedures.  Patient's individual exercise prescription and treatment plan were reviewed.  All starting workloads were established based on the results of the 6 minute walk test done at initial orientation visit.  The plan for exercise progression was also introduced and progression will be customized based on patient's performance and goals.                Exercise Goals  and Review:   Exercise Goals     Row Name 10/14/23 1514             Exercise Goals   Increase Physical Activity Yes       Intervention Provide advice, education, support and counseling about physical activity/exercise needs.;Develop an individualized exercise prescription for  aerobic and resistive training based on initial evaluation findings, risk stratification, comorbidities and participant's personal goals.       Expected Outcomes Short Term: Attend rehab on a regular basis to increase amount of physical activity.;Long Term: Add in home exercise to make exercise part of routine and to increase amount of physical activity.;Long Term: Exercising regularly at least 3-5 days a week.       Increase Strength and Stamina Yes       Intervention Provide advice, education, support and counseling about physical activity/exercise needs.;Develop an individualized exercise prescription for aerobic and resistive training based on initial evaluation findings, risk stratification, comorbidities and participant's personal goals.       Expected Outcomes Short Term: Increase workloads from initial exercise prescription for resistance, speed, and METs.;Short Term: Perform resistance training exercises routinely during rehab and add in resistance training at home;Long Term: Improve cardiorespiratory fitness, muscular endurance and strength as measured by increased METs and functional capacity ( )       Able to understand and use rate of perceived exertion (RPE) scale Yes       Intervention Provide education and explanation on how to use RPE scale       Expected Outcomes Short Term: Able to use RPE daily in rehab to express subjective intensity level;Long Term:  Able to use RPE to guide intensity level when exercising independently       Able to understand and use Dyspnea scale Yes       Intervention Provide education and explanation on how to use Dyspnea scale       Expected Outcomes Short Term: Able to use Dyspnea scale daily in rehab to express subjective sense of shortness of breath during exertion;Long Term: Able to use Dyspnea scale to guide intensity level when exercising independently       Knowledge and understanding of Target Heart Rate Range (THRR) Yes       Intervention Provide  education and explanation of THRR including how the numbers were predicted and where they are located for reference       Expected Outcomes Short Term: Able to use daily as guideline for intensity in rehab;Short Term: Able to state/look up THRR;Long Term: Able to use THRR to govern intensity when exercising independently       Able to check pulse independently Yes       Intervention Provide education and demonstration on how to check pulse in carotid and radial arteries.;Review the importance of being able to check your own pulse for safety during independent exercise       Expected Outcomes Short Term: Able to explain why pulse checking is important during independent exercise;Long Term: Able to check pulse independently and accurately       Understanding of Exercise Prescription Yes       Intervention Provide education, explanation, and written materials on patient's individual exercise prescription       Expected Outcomes Short Term: Able to explain program exercise prescription;Long Term: Able to explain home exercise prescription to exercise independently                Exercise Goals Re-Evaluation :  Exercise Goals  Re-Evaluation     Row Name 10/15/23 1410 10/27/23 1421 10/28/23 0833 11/12/23 0912 12/08/23 1408     Exercise Goal Re-Evaluation   Exercise Goals Review -- Increase Physical Activity;Increase Strength and Stamina;Able to understand and use Dyspnea scale;Able to understand and use rate of perceived exertion (RPE) scale;Able to check pulse independently Increase Physical Activity;Increase Strength and Stamina;Understanding of Exercise Prescription Increase Physical Activity;Increase Strength and Stamina;Understanding of Exercise Prescription Increase Physical Activity;Increase Strength and Stamina;Able to understand and use rate of perceived exertion (RPE) scale;Able to understand and use Dyspnea scale;Able to check pulse independently;Knowledge and understanding of Target Heart  Rate Range (THRR);Understanding of Exercise Prescription   Comments Reviewed RPE and dyspnea scale, THR and program prescription with pt today.  Pt voiced understanding and was given a copy of goals to take home. Nile says he doesn't do much exercise outside the program. He does minimal walking outside of here, but not much. Seeley is doing well in rehab and is on his 5th visit. He has increased his grade on the treadmill to 0.5 and has also increased his level on the XR to 2. WIll continue to montior and progress asable. Vishwa continues to do well in rehab, he is on his 9th visit. He has increads his walking speed to 1.6 on the treadmill with a 0.5 grade and is completing the whole 15 min on the treadmill. Will continue to monitor and progress as able. Berwyn states he does a little bit of walking at home, yet most of his exercise comes from here at the program.   Expected Outcomes Short: Use RPE daily to regulate intensity.  Long: Follow program prescription in THR. Short: Continue to attend rehab. Long term: Start exercising at home. Continue to attend rehab Continue to attend rehab Short: Continue to attend rehab. Long: Incorporate more home exercise into his routine.    Row Name 01/01/24 1110             Exercise Goal Re-Evaluation   Exercise Goals Review Increase Physical Activity;Increase Strength and Stamina;Understanding of Exercise Prescription       Comments Arvon is doing well overall in rehab. He is walking at home on his off days and staying active at home.  He does note that his stamina has gotten better.       Expected Outcomes Short:Continue to walk more at home  Long: Contiue to improve stamina                Discharge Exercise Prescription (Final Exercise Prescription Changes):  Exercise Prescription Changes - 12/22/23 1500       Response to Exercise   Blood Pressure (Admit) 118/70    Blood Pressure (Exit) 130/80    Heart Rate (Admit) 74 bpm    Heart Rate (Exercise) 113  bpm    Heart Rate (Exit) 92 bpm    Oxygen Saturation (Admit) 93 %    Oxygen Saturation (Exercise) 90 %    Oxygen Saturation (Exit) 92 %    Rating of Perceived Exertion (Exercise) 14    Perceived Dyspnea (Exercise) 3    Duration Continue with 30 min of aerobic exercise without signs/symptoms of physical distress.    Intensity THRR unchanged      Progression   Progression Continue to progress workloads to maintain intensity without signs/symptoms of physical distress.      Resistance Training   Training Prescription Yes    Weight 5    Reps 10-15      Treadmill  MPH 1.5    Grade 1    Minutes 15    METs 2.35      REL-XR   Level 2    Speed 43    Minutes 15    METs 2.6             Nutrition:  Target Goals: Understanding of nutrition guidelines, daily intake of sodium 1500mg , cholesterol 200mg , calories 30% from fat and 7% or less from saturated fats, daily to have 5 or more servings of fruits and vegetables.  Biometrics:  Pre Biometrics - 10/14/23 1515       Pre Biometrics   Height 5\' 7"  (1.702 m)    Weight 233 lb 0.4 oz (105.7 kg)    Waist Circumference 50 inches    Hip Circumference 41 inches    Waist to Hip Ratio 1.22 %    BMI (Calculated) 36.49    Grip Strength 26.7 kg    Single Leg Stand 4 seconds              Nutrition Therapy Plan and Nutrition Goals:   Nutrition Assessments:  Nutrition Assessments - 10/14/23 1517       Rate Your Plate Scores   Pre Score 0.89            MEDIFICTS Score Key: >=70 Need to make dietary changes  40-70 Heart Healthy Diet <= 40 Therapeutic Level Cholesterol Diet  Flowsheet Row PULMONARY REHAB COPD ORIENTATION from 10/14/2023 in [facility] CARDIAC REHABILITATION  Picture Your Plate Total Score on Admission 77      Picture Your Plate Scores: <47 Unhealthy dietary pattern with much room for improvement. 41-50 Dietary pattern unlikely to meet recommendations for good health and room for  improvement. 51-60 More healthful dietary pattern, with some room for improvement.  >60 Healthy dietary pattern, although there may be some specific behaviors that could be improved.    Nutrition Goals Re-Evaluation:  Nutrition Goals Re-Evaluation     Row Name 10/27/23 1416 12/08/23 1404 01/01/24 1941         Goals   Current Weight -- 232 lb (105.2 kg) --     Nutrition Goal Continue to attend rehab and eat healthy. Continue to attend rehab and eat healthy. Short: Continue to eat healthy. Long: Him and his spouse achieve some weight loss.     Comment Taiten says him and his partner overall try to eat a healthy diet. They did have a cheat weekend at the [facility], but they split the cheesecake. Silvester states him and his spouse have had a complete change of how they eat. His spouse enrolled in [place] weight loss center, so they are teaching them portion control. They also fast for 16 hours as part of their diet. Alexandar has been doing well with his diet.  He is eating better than when he started.  He is working with his wide to work on portion control.  He is eating mainly 1 main meal a day.     Expected Outcome Short: Eat healthy. Long term: Lose some weight and work on portion control. Short: Continue to eat healthy. Long: Him and his spouse achieve some weight loss. Short; continue to work on portion control Long: Continue to follow healthy eating              Nutrition Goals Discharge (Final Nutrition Goals Re-Evaluation):  Nutrition Goals Re-Evaluation - 01/01/24 1941       Goals   Nutrition Goal Short: Continue to  eat healthy. Long: Him and his spouse achieve some weight loss.    Comment Eston has been doing well with his diet.  He is eating better than when he started.  He is working with his wide to work on portion control.  He is eating mainly 1 main meal a day.    Expected Outcome Short; continue to work on portion control Long: Continue to follow healthy eating              Psychosocial: Target Goals: Acknowledge presence or absence of significant depression and/or stress, maximize coping skills, provide positive support system. Participant is able to verbalize types and ability to use techniques and skills needed for reducing stress and depression.  Initial Review & Psychosocial Screening:  Initial Psych Review & Screening - 10/14/23 1432       Initial Review   Current issues with Current Sleep Concerns   Wakes up once every night- falls back asleep within the hour.     Family Dynamics   Good Support System? Yes    Comments Husband Corbin Dess is support system.      Barriers   Psychosocial barriers to participate in program There are no identifiable barriers or psychosocial needs.;The patient should benefit from training in stress management and relaxation.      Screening Interventions   Interventions Encouraged to exercise;Provide feedback about the scores to participant;To provide support and resources with identified psychosocial needs    Expected Outcomes Short Term goal: Utilizing psychosocial counselor, staff and physician to assist with identification of specific Stressors or current issues interfering with healing process. Setting desired goal for each stressor or current issue identified.;Long Term Goal: Stressors or current issues are controlled or eliminated.;Short Term goal: Identification and review with participant of any Quality of Life or Depression concerns found by scoring the questionnaire.;Long Term goal: The participant improves quality of Life and PHQ9 Scores as seen by post scores and/or verbalization of changes             Quality of Life Scores:  Scores of 19 and below usually indicate a poorer quality of life in these areas.  A difference of  2-3 points is a clinically meaningful difference.  A difference of 2-3 points in the total score of the Quality of Life Index has been associated with significant improvement in overall  quality of life, self-image, physical symptoms, and general health in studies assessing change in quality of life.   PHQ-9: Review Flowsheet  More data exists      10/12/2023 06/19/2023 06/09/2023 05/13/2023 03/10/2023  Depression screen PHQ 2/9  Decreased Interest 0 0 0 0 0  Down, Depressed, Hopeless 0 0 0 0 0  PHQ - 2 Score 0 0 0 0 0  Altered sleeping 1 0 0 - 0  Tired, decreased energy - 0 0 - 0  Change in appetite 0 0 0 - 0  Feeling bad or failure about yourself  0 0 0 - 0  Trouble concentrating 0 0 0 - 0  Moving slowly or fidgety/restless 0 0 0 - 0  Suicidal thoughts 0 0 0 - 0  PHQ-9 Score 1 0 0 - 0  Difficult doing work/chores Not difficult at all Not difficult at all Not difficult at all - -   Interpretation of Total Score  Total Score Depression Severity:  1-4 = Minimal depression, 5-9 = Mild depression, 10-14 = Moderate depression, 15-19 = Moderately severe depression, 20-27 = Severe depression   Psychosocial Evaluation  and Intervention:  Psychosocial Evaluation - 10/14/23 1524       Psychosocial Evaluation & Interventions   Interventions Encouraged to exercise with the program and follow exercise prescription;Relaxation education;Stress management education    Comments Eliseo was referred to this program for his diagnosis of COPD. Currently he gets short of breath with minimal activities, yet he wants to improve this to not be as short of breath as often. 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 He occasionally will get dizzy first thing in the mornings, but it resolves shortly after sitting on the side of the bed. He denies any stressors in his life, his support system consists of his signifcant other, Robin. Olon is wanting to regain his strength and stamina and not be so short of breath as a result of this program. No barries identified to complete the program.    Continue Psychosocial Services   Follow up required by staff             Psychosocial Re-Evaluation:  Psychosocial Re-Evaluation     Row Name 10/27/23 1414 12/08/23 1402 01/01/24 1125         Psychosocial Re-Evaluation   Current issues with None Identified None Identified None Identified     Comments Maor states that his stress level is at a good level, no current life stressors. He says he is sleeping well and his support system is great. Skipper states that he currently doesn't have any stress and that he is sleeping well. He has a great support system which is his spouse. Callan is doing well in rehab.  He is feeling good mentally and enjoys coming to class. He is still sleeping well.     Expected Outcomes Short: Continue to attend rehab. Long: Continue to have as few stressors in life as possible and continue sleeping well. Short: Continue to attend rehab. Long: Continue the stress relief techniques that are working in his life. Short: Continue to exercise for mental boost Long: Continue to stay positive     Interventions Encouraged to attend Pulmonary Rehabilitation for the exercise Encouraged to attend Pulmonary Rehabilitation for the exercise Encouraged to attend Pulmonary Rehabilitation for the exercise     Continue Psychosocial Services  Follow up required by staff Follow up required by staff Follow up required by staff              Psychosocial Discharge (Final Psychosocial Re-Evaluation):  Psychosocial Re-Evaluation - 01/01/24 1125       Psychosocial Re-Evaluation   Current issues with None Identified    Comments Romar is doing well in rehab.  He is feeling good mentally and enjoys coming to class. He is still sleeping well.    Expected Outcomes Short: Continue to exercise for mental boost Long: Continue to stay positive    Interventions Encouraged to attend Pulmonary Rehabilitation for the exercise    Continue Psychosocial Services  Follow up required by staff               Education: Education Goals: Education classes will be provided on a weekly basis, covering required topics. Participant will state understanding/return demonstration of topics presented.  Learning Barriers/Preferences:  Learning Barriers/Preferences - 10/14/23 1435       Learning Barriers/Preferences   Learning Barriers None    Learning Preferences Written Material;Video;Individual Instruction;Group Instruction             Education Topics: How  Lungs Work and Diseases: - Discuss the anatomy of the lungs and diseases that can affect the lungs, such as COPD. Flowsheet Row PULMONARY REHAB CHRONIC OBSTRUCTIVE PULMONARY DISEASE from 12/31/2023 in [place] [facility]  Date 11/19/23  Educator Hb  Instruction Review Code 1- Verbalizes Understanding       Exercise: -Discuss the importance of exercise, FITT principles of exercise, normal and abnormal responses to exercise, and how to exercise safely.   Environmental Irritants: -Discuss types of environmental irritants and how to limit exposure to environmental irritants.   Meds/Inhalers and oxygen: - Discuss respiratory medications, definition of an inhaler and oxygen, and the proper way to use an inhaler and oxygen.   Energy Saving Techniques: - Discuss methods to conserve energy and decrease shortness of breath when performing activities of daily living.    Bronchial Hygiene / Breathing Techniques: - Discuss breathing mechanics, pursed-lip breathing technique,  proper posture, effective ways to clear airways, and other functional breathing techniques   Cleaning Equipment: - Provides group verbal and written instruction about the health risks of elevated stress, cause of high stress, and healthy ways to reduce stress.   Nutrition I: Fats: - Discuss the types of cholesterol, what cholesterol does to the body, and how cholesterol levels can be controlled. Flowsheet Row PULMONARY REHAB CHRONIC  OBSTRUCTIVE PULMONARY DISEASE from 12/31/2023 in [place] [facility]  Date 10/22/23  Educator [facility]  Instruction Review Code 1- Verbalizes Understanding       Nutrition II: Labels: -Discuss the different components of food labels and how to read food labels. Flowsheet Row PULMONARY REHAB CHRONIC OBSTRUCTIVE PULMONARY DISEASE from 12/31/2023 in [place] [facility]  Date 12/24/23  Educator HB  Instruction Review Code 1- Verbalizes Understanding       Respiratory Infections: - Discuss the signs and symptoms of respiratory infections, ways to prevent respiratory infections, and the importance of seeking medical treatment when having a respiratory infection.   Stress I: Signs and Symptoms: - Discuss the causes of stress, how stress may lead to anxiety and depression, and ways to limit stress. Flowsheet Row PULMONARY REHAB CHRONIC OBSTRUCTIVE PULMONARY DISEASE from 12/31/2023 in [place] [facility]  Date 10/29/23  Educator DJ  Instruction Review Code 1- Verbalizes Understanding       Stress II: Relaxation: -Discuss relaxation techniques to limit stress. Flowsheet Row PULMONARY REHAB CHRONIC OBSTRUCTIVE PULMONARY DISEASE from 12/31/2023 in [place] [facility]  Date 10/15/23  Educator [facility]  Instruction Review Code 1- Verbalizes Understanding       Oxygen for Home/Travel: - Discuss how to prepare for travel when on oxygen and proper ways to transport and store oxygen to ensure safety.   Knowledge Questionnaire Score:  Knowledge Questionnaire Score - 10/14/23 1541       Knowledge Questionnaire Score   Pre Score 16/18             Core Components/Risk Factors/Patient Goals at Admission:  Personal Goals and Risk Factors at Admission - 10/14/23 1519       Core Components/Risk Factors/Patient Goals on Admission    Weight Management Yes    Intervention Weight Management: Develop a combined nutrition and exercise program  designed to reach desired caloric intake, while maintaining appropriate intake of nutrient and fiber, sodium and fats, and appropriate energy expenditure required for the weight goal.;Weight Management: Provide education and appropriate resources to help participant work on and attain dietary goals.;Weight Management/Obesity: Establish reasonable short term and long term weight goals.  Admit Weight 233 lb 0.4 oz (105.7 kg)    Improve shortness of breath with ADL's Yes    Intervention Provide education, individualized exercise plan and daily activity instruction to help decrease symptoms of SOB with activities of daily living.    Expected Outcomes Short Term: Improve cardiorespiratory fitness to achieve a reduction of symptoms when performing ADLs    Increase knowledge of respiratory medications and ability to use respiratory devices properly  Yes    Intervention Provide education and demonstration as needed of appropriate use of medications, inhalers, and oxygen therapy.    Expected Outcomes Short Term: Achieves understanding of medications use. Understands that oxygen is a medication prescribed by physician. Demonstrates appropriate use of inhaler and oxygen therapy.;Long Term: Maintain appropriate use of medications, inhalers, and oxygen therapy.    Diabetes Yes    Intervention Provide education about signs/symptoms and action to take for hypo/hyperglycemia.;Provide education about proper nutrition, including hydration, and aerobic/resistive exercise prescription along with prescribed medications to achieve blood glucose in normal ranges: Fasting glucose 65-99 mg/dL    Expected Outcomes Short Term: Participant verbalizes understanding of the signs/symptoms and immediate care of hyper/hypoglycemia, proper foot care and importance of medication, aerobic/resistive exercise and nutrition plan for blood glucose control.;Long Term: Attainment of HbA1C < 7%.    Hypertension Yes    Intervention Monitor  prescription use compliance.;Provide education on lifestyle modifcations including regular physical activity/exercise, weight management, moderate sodium restriction and increased consumption of fresh fruit, vegetables, and low fat dairy, alcohol moderation, and smoking cessation.    Expected Outcomes Long Term: Maintenance of blood pressure at goal levels.;Short Term: Continued assessment and intervention until BP is < 140/87mm HG in hypertensive participants. < 130/11mm HG in hypertensive participants with diabetes, heart failure or chronic kidney disease.    Lipids Yes    Intervention Provide education and support for participant on nutrition & aerobic/resistive exercise along with prescribed medications to achieve LDL 70mg , HDL >40mg .    Expected Outcomes Short Term: Participant states understanding of desired cholesterol values and is compliant with medications prescribed. Participant is following exercise prescription and nutrition guidelines.;Long Term: Cholesterol controlled with medications as prescribed, with individualized exercise RX and with personalized nutrition plan. Value goals: LDL < 70mg , HDL > 40 mg.             Core Components/Risk Factors/Patient Goals Review:   Goals and Risk Factor Review     Row Name 10/27/23 1418 12/08/23 1406 01/01/24 1943         Core Components/Risk Factors/Patient Goals Review   Personal Goals Review Weight Management/Obesity;Increase knowledge of respiratory medications and ability to use respiratory devices properly.;Lipids Weight Management/Obesity;Increase knowledge of respiratory medications and ability to use respiratory devices properly.;Lipids;Hypertension Weight Management/Obesity;Increase knowledge of respiratory medications and ability to use respiratory devices properly.;Lipids;Hypertension;Improve shortness of breath with ADL's     Review Jaber said his breathing has been a little better since attending the program. He does not have a  pulse oximeter to check his sat's at home but he is planning on buying one in the near future. His BP runs stable like it does here. He is taking his medications like he is supposed to. [place] states he is taking his medications as he should at home. He states he checks his pulse ox several times a day and it is in the mid 90's. Fleet is doing well in rehab.  He is starting to lose weight.  His pressures are doing well.  His breathing has  gotten a little better.  His meds are doing welll. He wants to continue to improve     Expected Outcomes Short term: Continue to attend rehab. Long term: Breathing continue to gets better. Short term: Continue to attend rehab. Long term: Breathing continue to gets better. Short: Continue to work on weight loss lOng: Contineu to monitor breathing              Core Components/Risk Factors/Patient Goals at Discharge (Final Review):   Goals and Risk Factor Review - 01/01/24 1943       Core Components/Risk Factors/Patient Goals Review   Personal Goals Review Weight Management/Obesity;Increase knowledge of respiratory medications and ability to use respiratory devices properly.;Lipids;Hypertension;Improve shortness of breath with ADL's    Review Imaad is doing well in rehab.  He is starting to lose weight.  His pressures are doing well.  His breathing has gotten a little better.  His meds are doing welll. He wants to continue to improve    Expected Outcomes Short: Continue to work on weight loss lOng: Contineu to monitor breathing             ITP Comments:  ITP Comments     Row Name 10/14/23 1533 10/15/23 1409 11/11/23 1145 12/09/23 1446 01/06/24 1130   ITP Comments Patient attend orientation today.  Patient is attending Pulmonary Rehabilitation Program.  Documentation for diagnosis can be found in [facility]/EPIC.  Reviewed medical chart, RPE/RPD, gym safety, and program guidelines.  Patient was fitted to equipment they will be using during rehab.  Patient is  scheduled to start exercise on 10/15/23.   Initial ITP created and sent for review and signature by Dr. Gwendalyn Lemma, Medical Director for Pulmonary Rehabilitation Program. First full day of exercise!  Patient was oriented to gym and equipment including functions, settings, policies, and procedures.  Patient's individual exercise prescription and treatment plan were reviewed.  All starting workloads were established based on the results of the 6 minute walk test done at initial orientation visit.  The plan for exercise progression was also introduced and progression will be customized based on patient's performance and goals. 30 day review completed. ITP sent to Dr.Jehanzeb Memon, Medical Director of  Pulmonary Rehab. Continue with ITP unless changes are made by physician. 30 day review completed. ITP sent to Dr.Jehanzeb Memon, Medical Director of  Pulmonary Rehab. Continue with ITP unless changes are made by physician. 30 day review completed. ITP sent to Dr.Jehanzeb Memon, Medical Director of  Pulmonary Rehab. Continue with ITP unless changes are made by physician.            Comments: 30 day review

## 2024-01-07 ENCOUNTER — Encounter (HOSPITAL_COMMUNITY)
Admission: RE | Admit: 2024-01-07 | Discharge: 2024-01-07 | Disposition: A | Discharge status: Home / Self Care | Payer: PPO | Source: Ambulatory Visit | Attending: Internal Medicine | Admitting: Internal Medicine

## 2024-01-07 DIAGNOSIS — J449 Chronic obstructive pulmonary disease, unspecified: Secondary | ICD-10-CM | POA: Diagnosis not present

## 2024-01-07 NOTE — Progress Notes (Signed)
 Daily Session Note  Patient Details  Name: Jerome Sanchez MRN: [id number] Date of Birth: June 17, 1951 Referring Provider:   Flowsheet Row PULMONARY REHAB COPD ORIENTATION from 10/14/2023 in [facility]  Referring Provider Vernestine Gondola MD       Encounter Date: 01/07/2024  Check In:  Session Check In - 01/07/24 1315       Check-In   Supervising physician immediately available to respond to emergencies See telemetry face sheet for immediately available MD    Location [facility]    Staff Present Doug Gehrig, RN, BSN;Jessica Zoila Hines, MA, RCEP, CCRP, CCET;Heather Alec Huntington, Exercise Physiologist;Hillary Lennie Ra BSN, RN    Virtual Visit No    Medication changes reported     No    Fall or balance concerns reported    No    Warm-up and Cool-down Performed on first and last piece of equipment    Resistance Training Performed Yes    VAD Patient? No    PAD/SET Patient? No      Pain Assessment   Currently in Pain? No/denies    Multiple Pain Sites No             Capillary Blood Glucose: No results found for this or any previous visit (from the past 24 hours).    Social History   Tobacco Use  Smoking Status Former   Current packs/day: 0.00   Average packs/day: 1.3 packs/day for 51.0 years (63.8 ttl pk-yrs)   Types: Cigarettes   Start date: 59   Quit date: 2018   Years since quitting: 7.3  Smokeless Tobacco Never    Goals Met:  Proper associated with RPD/PD & O2 Sat Independence with exercise equipment Using PLB without cueing & demonstrates good technique Exercise tolerated well No report of concerns or symptoms today Strength training completed today  Goals Unmet:  Not Applicable  Comments: Pt able to follow exercise prescription today without complaint.  Will continue to monitor for progression.

## 2024-01-12 ENCOUNTER — Encounter (HOSPITAL_COMMUNITY)
Admission: RE | Admit: 2024-01-12 | Discharge: 2024-01-12 | Disposition: A | Discharge status: Home / Self Care | Payer: PPO | Source: Ambulatory Visit | Attending: Internal Medicine | Admitting: Internal Medicine

## 2024-01-12 DIAGNOSIS — J449 Chronic obstructive pulmonary disease, unspecified: Secondary | ICD-10-CM | POA: Diagnosis not present

## 2024-01-14 ENCOUNTER — Encounter (HOSPITAL_COMMUNITY)
Admission: RE | Admit: 2024-01-14 | Discharge: 2024-01-14 | Disposition: A | Discharge status: Home / Self Care | Payer: PPO | Source: Ambulatory Visit | Attending: Internal Medicine | Admitting: Internal Medicine

## 2024-01-14 DIAGNOSIS — J449 Chronic obstructive pulmonary disease, unspecified: Secondary | ICD-10-CM | POA: Diagnosis not present

## 2024-01-14 NOTE — Progress Notes (Signed)
 Daily Session Note  Patient Details  Name: Jerome Sanchez MRN: [id number] Date of Birth: 25-Dec-1950 Referring Provider:   Flowsheet Row PULMONARY REHAB COPD ORIENTATION from 10/14/2023 in [facility]  Referring Provider Vernestine Gondola MD       Encounter Date: 01/14/2024  Check In:  Session Check In - 01/14/24 1337       Check-In   Supervising physician immediately available to respond to emergencies See telemetry face sheet for immediately available MD    Location [facility]    Staff Present Clotilda Danish, BS, Exercise Physiologist;Brittany Annette Barters, BSN, RN, Aggie Horton, MA, RCEP, CCRP, CCET    Virtual Visit No    Medication changes reported     No    Fall or balance concerns reported    No    Warm-up and Cool-down Performed on first and last piece of equipment    Resistance Training Performed Yes    VAD Patient? No    PAD/SET Patient? No      Pain Assessment   Currently in Pain? No/denies             Capillary Blood Glucose: No results found for this or any previous visit (from the past 24 hours).    Social History   Tobacco Use  Smoking Status Former   Current packs/day: 0.00   Average packs/day: 1.3 packs/day for 51.0 years (63.8 ttl pk-yrs)   Types: Cigarettes   Start date: 49   Quit date: 2018   Years since quitting: 7.4  Smokeless Tobacco Never    Goals Met:  Proper associated with RPD/PD & O2 Sat Independence with exercise equipment Using PLB without cueing & demonstrates good technique Exercise tolerated well No report of concerns or symptoms today Strength training completed today  Goals Unmet:  Not Applicable  Comments: Pt able to follow exercise prescription today without complaint.  Will continue to monitor for progression.

## 2024-01-19 ENCOUNTER — Encounter (HOSPITAL_COMMUNITY)
Admission: RE | Admit: 2024-01-19 | Discharge: 2024-01-19 | Disposition: A | Discharge status: Home / Self Care | Payer: PPO | Source: Ambulatory Visit | Attending: Internal Medicine | Admitting: Internal Medicine

## 2024-01-19 DIAGNOSIS — J449 Chronic obstructive pulmonary disease, unspecified: Secondary | ICD-10-CM | POA: Insufficient documentation

## 2024-01-19 NOTE — Progress Notes (Signed)
 Daily Session Note  Patient Details  Name: Jerome Sanchez MRN: [id number] Date of Birth: [birth year] Referring Provider:   Flowsheet Row PULMONARY REHAB COPD ORIENTATION from 10/14/2023 in [facility]  Referring Provider Vernestine Gondola MD       Encounter Date: 01/19/2024  Check In:  Session Check In - 01/19/24 1330       Check-In   Supervising physician immediately available to respond to emergencies See telemetry face sheet for immediately available MD    Location [facility]    Staff Present Clotilda Danish, BS, Exercise Physiologist;Phyllis Billingsley, RN;Brittany Annette Barters, BSN, RN, WTA-C    Virtual Visit No    Medication changes reported     No    Fall or balance concerns reported    No    Tobacco Cessation No Change    Warm-up and Cool-down Performed on first and last piece of equipment    Resistance Training Performed Yes    VAD Patient? No    PAD/SET Patient? No      Pain Assessment   Currently in Pain? No/denies    Multiple Pain Sites No             Capillary Blood Glucose: No results found for this or any previous visit (from the past 24 hours).    Social History   Tobacco Use  Smoking Status Former   Current packs/day: 0.00   Average packs/day: 1.3 packs/day for 51.0 years (63.8 ttl pk-yrs)   Types: Cigarettes   Start date: 79   Quit date: 2018   Years since quitting: 7.4  Smokeless Tobacco Never    Goals Met:  Independence with exercise equipment Exercise tolerated well No report of concerns or symptoms today Strength training completed today  Goals Unmet:  Not Applicable  Comments: Pt able to follow exercise prescription today without complaint.  Will continue to monitor for progression.

## 2024-01-21 ENCOUNTER — Encounter (HOSPITAL_COMMUNITY): Payer: PPO

## 2024-01-22 ENCOUNTER — Telehealth: Payer: Self-pay | Admitting: Adult Health

## 2024-01-22 NOTE — Telephone Encounter (Signed)
 Rc'd fax for CPAP supplies. Sent to Ms. Parrett  for signature. She has signed it. I will fax it back to [place] in [place].

## 2024-01-26 ENCOUNTER — Encounter (HOSPITAL_COMMUNITY): Payer: PPO

## 2024-01-28 ENCOUNTER — Encounter (HOSPITAL_COMMUNITY): Payer: PPO

## 2024-02-02 ENCOUNTER — Encounter (HOSPITAL_COMMUNITY): Payer: PPO

## 2024-02-02 ENCOUNTER — Encounter (HOSPITAL_COMMUNITY)
Admission: RE | Admit: 2024-02-02 | Discharge: 2024-02-02 | Disposition: A | Discharge status: Home / Self Care | Source: Ambulatory Visit | Attending: Internal Medicine | Admitting: Internal Medicine

## 2024-02-02 DIAGNOSIS — J449 Chronic obstructive pulmonary disease, unspecified: Secondary | ICD-10-CM

## 2024-02-02 NOTE — Progress Notes (Signed)
 Daily Session Note  Patient Details  Name: Jerome Sanchez MRN: [id number] Date of Birth: 1950/11/20 Referring Provider:   Flowsheet Row PULMONARY REHAB COPD ORIENTATION from 10/14/2023 in [facility]  Referring Provider Vernestine Gondola MD    Encounter Date: 02/02/2024  Check In:  Session Check In - 02/02/24 1330       Check-In   Supervising physician immediately available to respond to emergencies See telemetry face sheet for immediately available MD    Location [facility]    Staff Present Clotilda Danish, BS, Exercise Physiologist;Laureen Rachell Budge, RRT, CPFT;Brittany Annette Barters, BSN, RN, WTA-C    Virtual Visit No    Medication changes reported     No    Fall or balance concerns reported    No    Tobacco Cessation No Change    Warm-up and Cool-down Performed on first and last piece of equipment    Resistance Training Performed Yes    VAD Patient? No    PAD/SET Patient? No      Pain Assessment   Currently in Pain? No/denies    Multiple Pain Sites No          Capillary Blood Glucose: No results found for this or any previous visit (from the past 24 hours).    Social History   Tobacco Use  Smoking Status Former   Current packs/day: 0.00   Average packs/day: 1.3 packs/day for 51.0 years (63.8 ttl pk-yrs)   Types: Cigarettes   Start date: 3   Quit date: 2018   Years since quitting: 7.4  Smokeless Tobacco Never    Goals Met:  Independence with exercise equipment Exercise tolerated well No report of concerns or symptoms today Strength training completed today  Goals Unmet:  Not Applicable  Comments: Pt able to follow exercise prescription today without complaint.  Will continue to monitor for progression.

## 2024-02-03 NOTE — Progress Notes (Signed)
 Pulmonary Individual Treatment Plan  Patient Details  Name: Jerome Sanchez MRN: [id number] Date of Birth: 05-06-1951 Referring Provider:   Flowsheet Row PULMONARY REHAB COPD ORIENTATION from 10/14/2023 in [facility] CARDIAC REHABILITATION  Referring Provider Vernestine Gondola MD    Initial Encounter Date:  Flowsheet Row PULMONARY REHAB COPD ORIENTATION from 10/14/2023 in [facility]IAC [facility]  Date 10/14/23    Visit Diagnosis: Chronic obstructive pulmonary disease, unspecified COPD type (HCC)  Patient's Home Medications on Admission:   Current Outpatient Medications:    albuterol  (VENTOLIN  HFA) 108 (90 Base) MCG/ACT inhaler, INHALE 1 TO 2 PUFFS BY MOUTH EVERY 6 HOURS AS NEEDED FOR WHEEZING FOR SHORTNESS OF BREATH, Disp: 9 g, Rfl: 0   atorvastatin  (LIPITOR) 80 MG tablet, Take 1 tablet (80 mg total) by mouth daily., Disp: 100 tablet, Rfl: 2   dapagliflozin propanediol (FARXIGA) 5 MG TABS tablet, Take 5 mg by mouth daily., Disp: , Rfl:    ezetimibe  (ZETIA ) 10 MG tablet, Take 1 tablet (10 mg total) by mouth daily., Disp: 100 tablet, Rfl: 1   Fluticasone-Umeclidin-Vilant (TRELEGY ELLIPTA ) 100-62.5-25 MCG/ACT AEPB, INHALE 1 PUFF INTO LUNGS ONCE DAILY, Disp: 180 each, Rfl: 6   furosemide  (LASIX ) 40 MG tablet, Take 1 tablet (40 mg total) by mouth daily., Disp: 90 tablet, Rfl: 3   lansoprazole  (PREVACID ) 15 MG capsule, Take 1 capsule (15 mg total) by mouth as needed., Disp: 100 capsule, Rfl: 1   OVER THE COUNTER MEDICATION, Areds ( omega 3, lutein, zeaxanthin) take one a day, Disp: , Rfl:    valsartan  (DIOVAN ) 80 MG tablet, Take 1 tablet (80 mg total) by mouth daily., Disp: 100 tablet, Rfl: 3  Past Medical History: Past Medical History:  Diagnosis Date   Allergy    Arthritis    Asthma    Cardiac arrhythmia    Chronic kidney disease    COPD (chronic obstructive pulmonary disease) (HCC)    Essential hypertension, benign    GERD (gastroesophageal reflux disease)    Impaired glucose  tolerance    Injury of right rotator cuff    Lumbar disc disease    Mixed hyperlipidemia    OSA (obstructive sleep apnea)    Sleep apnea    Type 2 diabetes mellitus (HCC)    Vitamin D deficiency     Tobacco Use: Social History   Tobacco Use  Smoking Status Former   Current packs/day: 0.00   Average packs/day: 1.3 packs/day for 51.0 years (63.8 ttl pk-yrs)   Types: Cigarettes   Start date: 71   Quit date: 2018   Years since quitting: 7.4  Smokeless Tobacco Never    Labs: Review Flowsheet  More data exists      Latest Ref Rng & Units 08/22/2021 03/05/2022 07/15/2022 06/22/2023 10/02/2023  Labs for ITP Cardiac and Pulmonary Rehab  Cholestrol 100 - 199 mg/dL 119  147  829  562  130   LDL (calc) 0 - 99 mg/dL 865  784  74  67  71   HDL-C >39 mg/dL 79  84  94  79  85   Trlycerides 0 - 149 mg/dL 696  76  81  62  72   Hemoglobin A1c 4.8 - 5.6 % 6.1  6.0  - 6.6  6.7     Capillary Blood Glucose: No results found for: GLUCAP   Pulmonary Assessment Scores:  Pulmonary Assessment Scores     Row Name 10/14/23 1522         ADL UCSD  ADL Phase Entry     SOB Score total 33     Rest 0     Walk 2     Stairs 3     Bath 1     Dress 0     Shop 1       CAT Score   CAT Score 14       UCSD: Self-administered rating of dyspnea associated with activities of daily living (ADLs) 6-point scale (0 = not at all to 5 = maximal or unable to do because of breathlessness)  Scoring Scores range from 0 to 120.  Minimally important difference is 5 units  CAT: CAT can identify the health impairment of COPD patients and is better correlated with disease progression.  CAT has a scoring range of zero to 40. The CAT score is classified into four groups of low (less than 10), medium (10 - 20), high (21-30) and very high (31-40) based on the impact level of disease on health status. A CAT score over 10 suggests significant symptoms.  A worsening CAT score could be explained by an  exacerbation, poor medication adherence, poor inhaler technique, or progression of COPD or comorbid conditions.  CAT MCID is 2 points  mMRC: mMRC (Modified Medical Research Council) Dyspnea Scale is used to assess the degree of baseline functional disability in patients of respiratory disease due to dyspnea. No minimal important difference is established. A decrease in score of 1 point or greater is considered a positive change.   Pulmonary Function Assessment:  Pulmonary Function Assessment - 01/27/24 1005       Initial Spirometry Results   FVC% 81 %    FEV1% 62 %    FEV1/FVC Ratio 55    Comments test completed 09/22/23      Post Bronchodilator Spirometry Results   FVC% 78 %    FEV1% 57 %    FEV1/FVC Ratio 53          Exercise Target Goals: Exercise Program Goal: Individual exercise prescription set using results from initial 6 min walk test and THRR while considering  patient's activity barriers and safety.   Exercise Prescription Goal: Initial exercise prescription builds to 30-45 minutes a day of aerobic activity, 2-3 days per week.  Home exercise guidelines will be given to patient during program as part of exercise prescription that the participant will acknowledge.  Activity Barriers & Risk Stratification:  Activity Barriers & Cardiac Risk Stratification - 10/14/23 1426       Activity Barriers & Cardiac Risk Stratification   Activity Barriers Arthritis;Back Problems;Balance Concerns;Shortness of Breath   Arthritis in both shoulders, Back pain due to a crushed disc.         6 Minute Walk:  6 Minute Walk     Row Name 10/14/23 1510 10/14/23 1511       6 Minute Walk   Phase Initial --    Distance 1200 feet --    Walk Time 6 minutes --    # of Rest Breaks 0 --    MPH 2.27 --    METS 2.6 --    RPE 13 --    Perceived Dyspnea  2 --    VO2 Peak 9.1 --    Symptoms No --    Resting HR 73 bpm --    Resting BP 130/80 --    Resting Oxygen Saturation  92 % --     Exercise Oxygen Saturation  during 6 min walk 96 % --  Max Ex. HR 150 bpm --    Max Ex. BP 142/80 --    2 Minute Post BP 132/80 --      Interval HR   1 Minute HR 133 --    2 Minute HR 140 --    3 Minute HR 146 --    4 Minute HR 140 --    5 Minute HR 143 --    6 Minute HR 150 --    2 Minute Post HR 86 --    Interval Heart Rate? Yes --      Interval Oxygen   Interval Oxygen? -- Yes    Baseline Oxygen Saturation % -- 92 %    1 Minute Oxygen Saturation % -- 88 %    1 Minute Liters of Oxygen -- 0 L    2 Minute Oxygen Saturation % -- 86 %    2 Minute Liters of Oxygen -- 0 L    3 Minute Oxygen Saturation % -- 86 %    3 Minute Liters of Oxygen -- 0 L    4 Minute Oxygen Saturation % -- 86 %    4 Minute Liters of Oxygen -- 0 L    5 Minute Oxygen Saturation % -- 87 %    5 Minute Liters of Oxygen -- 0 L    6 Minute Oxygen Saturation % -- 87 %    6 Minute Liters of Oxygen -- 0 L    2 Minute Post Oxygen Saturation % -- 93 %    2 Minute Post Liters of Oxygen -- 0 L       Oxygen Initial Assessment:  Oxygen Initial Assessment - 10/14/23 1431       Home Oxygen   Home Oxygen Device None    Sleep Oxygen Prescription CPAP   At night   Home Exercise Oxygen Prescription None    Home Resting Oxygen Prescription None    Compliance with Home Oxygen Use Yes   Compliance with CPAP use         Oxygen Re-Evaluation:  Oxygen Re-Evaluation     Row Name 10/27/23 1423 12/08/23 1407 01/01/24 1944         Program Oxygen Prescription   Program Oxygen Prescription None None None       Home Oxygen   Home Oxygen Device None None None     Sleep Oxygen Prescription CPAP CPAP CPAP     Home Exercise Oxygen Prescription None None None     Home Resting Oxygen Prescription None None None     Compliance with Home Oxygen Use Yes Yes Yes       Goals/Expected Outcomes   Short Term Goals To learn and understand importance of monitoring SPO2 with pulse oximeter and demonstrate accurate use of the  pulse oximeter.;To learn and understand importance of maintaining oxygen saturations>88%;To learn and demonstrate proper pursed lip breathing techniques or other breathing techniques. ;To learn and demonstrate proper use of respiratory medications To learn and understand importance of monitoring SPO2 with pulse oximeter and demonstrate accurate use of the pulse oximeter.;To learn and understand importance of maintaining oxygen saturations>88%;To learn and demonstrate proper pursed lip breathing techniques or other breathing techniques. ;To learn and demonstrate proper use of respiratory medications To learn and understand importance of monitoring SPO2 with pulse oximeter and demonstrate accurate use of the pulse oximeter.;To learn and understand importance of maintaining oxygen saturations>88%;To learn and demonstrate proper pursed lip breathing techniques or other breathing techniques. ;To learn and  demonstrate proper use of respiratory medications     Long  Term Goals Verbalizes importance of monitoring SPO2 with pulse oximeter and return demonstration;Exhibits proper breathing techniques, such as pursed lip breathing or other method taught during program session;Demonstrates proper use of MDI's;Compliance with respiratory medication;Maintenance of O2 saturations>88% Verbalizes importance of monitoring SPO2 with pulse oximeter and return demonstration;Exhibits proper breathing techniques, such as pursed lip breathing or other method taught during program session;Demonstrates proper use of MDI's;Compliance with respiratory medication;Maintenance of O2 saturations>88% Verbalizes importance of monitoring SPO2 with pulse oximeter and return demonstration;Exhibits proper breathing techniques, such as pursed lip breathing or other method taught during program session;Demonstrates proper use of MDI's;Compliance with respiratory medication;Maintenance of O2 saturations>88%     Comments Patient is taking his prescribed  medications as he should. States breathing has gotten a little better since starting the program as well. Jerome Sanchez states that his breathing kind of waxes and wanes. It is good for the most part but that it gets congested and harder to breathe when the weather changes. Jerome Sanchez is doing well in rehab.  His breathing has been getting a little better.  He is using his PLB.  He is compliant with his CPAP.     Goals/Expected Outcomes Short: Continue attending rehab and compliance with meds. Long: Increase exercise independance at home once program is finished. Short: Continue attending rehab and compliance with meds. Long: Increase exercise independance at home once program is finished. Short: COnitnue to monitor breathing Long; Conitnued compliance        Oxygen Discharge (Final Oxygen Re-Evaluation):  Oxygen Re-Evaluation - 01/01/24 1944       Program Oxygen Prescription   Program Oxygen Prescription None      Home Oxygen   Home Oxygen Device None    Sleep Oxygen Prescription CPAP    Home Exercise Oxygen Prescription None    Home Resting Oxygen Prescription None    Compliance with Home Oxygen Use Yes      Goals/Expected Outcomes   Short Term Goals To learn and understand importance of monitoring SPO2 with pulse oximeter and demonstrate accurate use of the pulse oximeter.;To learn and understand importance of maintaining oxygen saturations>88%;To learn and demonstrate proper pursed lip breathing techniques or other breathing techniques. ;To learn and demonstrate proper use of respiratory medications    Long  Term Goals Verbalizes importance of monitoring SPO2 with pulse oximeter and return demonstration;Exhibits proper breathing techniques, such as pursed lip breathing or other method taught during program session;Demonstrates proper use of MDI's;Compliance with respiratory medication;Maintenance of O2 saturations>88%    Comments Aundray is doing well in rehab.  His breathing has been getting a little  better.  He is using his PLB.  He is compliant with his CPAP.    Goals/Expected Outcomes Short: COnitnue to monitor breathing Long; Conitnued compliance          Initial Exercise Prescription:  Initial Exercise Prescription - 10/14/23 1500       Date of Initial Exercise RX and Referring Provider   Date 10/14/23    Referring Provider Vernestine Gondola MD      Oxygen   Maintain Oxygen Saturation 88% or higher      Treadmill   MPH 1.5    Grade 0    Minutes 15    METs 1.9      REL-XR   Level 1    Speed 50    Minutes 15    METs 1.8      Prescription  Details   Frequency (times per week) 2    Duration Progress to 30 minutes of continuous aerobic without signs/symptoms of physical distress      Intensity   THRR 40-80% of Max Heartrate 102/131    Ratings of Perceived Exertion 11-13    Perceived Dyspnea 0-4      Resistance Training   Training Prescription Yes    Weight 4    Reps 10-15          Perform Capillary Blood Glucose checks as needed.  Exercise Prescription Changes:   Exercise Prescription Changes     Row Name 10/14/23 1500 10/27/23 1500 11/10/23 1500 12/03/23 1500 12/22/23 1500     Response to Exercise   Blood Pressure (Admit) 130/80 128/80 152/58 128/72 118/70   Blood Pressure (Exercise) 142/80 130/80 156/70 -- --   Blood Pressure (Exit) 132/80 112/60 118/60 130/80 130/80   Heart Rate (Admit) 73 bpm 68 bpm 66 bpm 68 bpm 74 bpm   Heart Rate (Exercise) 150 bpm 108 bpm 110 bpm 97 bpm 113 bpm   Heart Rate (Exit) 86 bpm 87 bpm 78 bpm 86 bpm 92 bpm   Oxygen Saturation (Admit) 92 % 93 % 96 % 95 % 93 %   Oxygen Saturation (Exercise) 86 % 92 % 90 % 90 % 90 %   Oxygen Saturation (Exit) 93 % 94 % 93 % 94 % 92 %   Rating of Perceived Exertion (Exercise) 13 16 13 14 14    Perceived Dyspnea (Exercise) 2 2 3 3 3    Duration Continue with 30 min of aerobic exercise without signs/symptoms of physical distress. Continue with 30 min of aerobic exercise without  signs/symptoms of physical distress. Continue with 30 min of aerobic exercise without signs/symptoms of physical distress. Continue with 30 min of aerobic exercise without signs/symptoms of physical distress. Continue with 30 min of aerobic exercise without signs/symptoms of physical distress.   Intensity THRR unchanged THRR unchanged THRR unchanged THRR unchanged THRR unchanged     Progression   Progression Continue to progress workloads to maintain intensity without signs/symptoms of physical distress. Continue to progress workloads to maintain intensity without signs/symptoms of physical distress. Continue to progress workloads to maintain intensity without signs/symptoms of physical distress. Continue to progress workloads to maintain intensity without signs/symptoms of physical distress. Continue to progress workloads to maintain intensity without signs/symptoms of physical distress.     Resistance Training   Training Prescription -- Yes Yes Yes Yes   Weight -- 4 5 5 5    Reps -- 10-15 10-15 10-15 10-15     Treadmill   MPH -- 1.5 1.6 1.5 1.5   Grade -- 0.5 0.5 1 1    Minutes -- 15 15 15 15    METs -- 2.25 2.34 2.35 2.35     REL-XR   Level -- 2 2 2 2    Speed -- 46 55 56 43   Minutes -- 15 15 15 15    METs -- 3.4 4 3.1 2.6    Row Name 01/05/24 1500 01/19/24 1500           Response to Exercise   Blood Pressure (Admit) 120/72 112/62      Blood Pressure (Exit) 118/52 110/50      Heart Rate (Admit) 77 bpm 75 bpm      Heart Rate (Exercise) 95 bpm 111 bpm      Heart Rate (Exit) 82 bpm 90 bpm      Oxygen Saturation (Admit) 92 % 92 %  Oxygen Saturation (Exercise) 89 % 90 %      Oxygen Saturation (Exit) 94 % 94 %      Rating of Perceived Exertion (Exercise) 15 14      Perceived Dyspnea (Exercise) 2 2      Duration Continue with 30 min of aerobic exercise without signs/symptoms of physical distress. Continue with 30 min of aerobic exercise without signs/symptoms of physical distress.       Intensity THRR unchanged THRR unchanged        Progression   Progression Continue to progress workloads to maintain intensity without signs/symptoms of physical distress. Continue to progress workloads to maintain intensity without signs/symptoms of physical distress.        Resistance Training   Training Prescription Yes Yes      Weight 5 5      Reps 10-15 10-15        Treadmill   MPH 2 2.1      Grade 2 2      Minutes 15 15      METs 3.08 3.19        REL-XR   Level 2 2      Speed 51 66      Minutes 15 15      METs 3.3 4.3         Exercise Comments:   Exercise Comments     Row Name 10/15/23 1409           Exercise Comments First full day of exercise!  Patient was oriented to gym and equipment including functions, settings, policies, and procedures.  Patient's individual exercise prescription and treatment plan were reviewed.  All starting workloads were established based on the results of the 6 minute walk test done at initial orientation visit.  The plan for exercise progression was also introduced and progression will be customized based on patient's performance and goals.          Exercise Goals and Review:   Exercise Goals     Row Name 10/14/23 1514             Exercise Goals   Increase Physical Activity Yes       Intervention Provide advice, education, support and counseling about physical activity/exercise needs.;Develop an individualized exercise prescription for aerobic and resistive training based on initial evaluation findings, risk stratification, comorbidities and participant's personal goals.       Expected Outcomes Short Term: Attend rehab on a regular basis to increase amount of physical activity.;Long Term: Add in home exercise to make exercise part of routine and to increase amount of physical activity.;Long Term: Exercising regularly at least 3-5 days a week.       Increase Strength and Stamina Yes       Intervention Provide advice,  education, support and counseling about physical activity/exercise needs.;Develop an individualized exercise prescription for aerobic and resistive training based on initial evaluation findings, risk stratification, comorbidities and participant's personal goals.       Expected Outcomes Short Term: Increase workloads from initial exercise prescription for resistance, speed, and METs.;Short Term: Perform resistance training exercises routinely during rehab and add in resistance training at home;Long Term: Improve cardiorespiratory fitness, muscular endurance and strength as measured by increased METs and functional capacity ( )       Able to understand and use rate of perceived exertion (RPE) scale Yes       Intervention Provide education and explanation on how to use RPE scale  Expected Outcomes Short Term: Able to use RPE daily in rehab to express subjective intensity level;Long Term:  Able to use RPE to guide intensity level when exercising independently       Able to understand and use Dyspnea scale Yes       Intervention Provide education and explanation on how to use Dyspnea scale       Expected Outcomes Short Term: Able to use Dyspnea scale daily in rehab to express subjective sense of shortness of breath during exertion;Long Term: Able to use Dyspnea scale to guide intensity level when exercising independently       Knowledge and understanding of Target Heart Rate Range (THRR) Yes       Intervention Provide education and explanation of THRR including how the numbers were predicted and where they are located for reference       Expected Outcomes Short Term: Able to use daily as guideline for intensity in rehab;Short Term: Able to state/look up THRR;Long Term: Able to use THRR to govern intensity when exercising independently       Able to check pulse independently Yes       Intervention Provide education and demonstration on how to check pulse in carotid and radial arteries.;Review the  importance of being able to check your own pulse for safety during independent exercise       Expected Outcomes Short Term: Able to explain why pulse checking is important during independent exercise;Long Term: Able to check pulse independently and accurately       Understanding of Exercise Prescription Yes       Intervention Provide education, explanation, and written materials on patient's individual exercise prescription       Expected Outcomes Short Term: Able to explain program exercise prescription;Long Term: Able to explain home exercise prescription to exercise independently          Exercise Goals Re-Evaluation :  Exercise Goals Re-Evaluation     Row Name 10/15/23 1410 10/27/23 1421 10/28/23 0833 11/12/23 0912 12/08/23 1408     Exercise Goal Re-Evaluation   Exercise Goals Review -- Increase Physical Activity;Increase Strength and Stamina;Able to understand and use Dyspnea scale;Able to understand and use rate of perceived exertion (RPE) scale;Able to check pulse independently Increase Physical Activity;Increase Strength and Stamina;Understanding of Exercise Prescription Increase Physical Activity;Increase Strength and Stamina;Understanding of Exercise Prescription Increase Physical Activity;Increase Strength and Stamina;Able to understand and use rate of perceived exertion (RPE) scale;Able to understand and use Dyspnea scale;Able to check pulse independently;Knowledge and understanding of Target Heart Rate Range (THRR);Understanding of Exercise Prescription   Comments Reviewed RPE and dyspnea scale, THR and program prescription with pt today.  Pt voiced understanding and was given a copy of goals to take home. Jerome Sanchez says he doesn't do much exercise outside the program. He does minimal walking outside of here, but not much. Jerome Sanchez is doing well in rehab and is on his 5th visit. He has increased his grade on the treadmill to 0.5 and has also increased his level on the XR to 2. WIll continue to  montior and progress asable. Jerome Sanchez continues to do well in rehab, he is on his 9th visit. He has increads his walking speed to 1.6 on the treadmill with a 0.5 grade and is completing the whole 15 min on the treadmill. Will continue to monitor and progress as able. Jerome Sanchez states he does a little bit of walking at home, yet most of his exercise comes from here at the program.  Expected Outcomes Short: Use RPE daily to regulate intensity.  Long: Follow program prescription in THR. Short: Continue to attend rehab. Long term: Start exercising at home. Continue to attend rehab Continue to attend rehab Short: Continue to attend rehab. Long: Incorporate more home exercise into his routine.    Row Name 01/01/24 1110             Exercise Goal Re-Evaluation   Exercise Goals Review Increase Physical Activity;Increase Strength and Stamina;Understanding of Exercise Prescription       Comments Jerome Sanchez is doing well overall in rehab. He is walking at home on his off days and staying active at home.  He does note that his stamina has gotten better.       Expected Outcomes Short:Continue to walk more at home  Long: Contiue to improve stamina          Discharge Exercise Prescription (Final Exercise Prescription Changes):  Exercise Prescription Changes - 01/19/24 1500       Response to Exercise   Blood Pressure (Admit) 112/62    Blood Pressure (Exit) 110/50    Heart Rate (Admit) 75 bpm    Heart Rate (Exercise) 111 bpm    Heart Rate (Exit) 90 bpm    Oxygen Saturation (Admit) 92 %    Oxygen Saturation (Exercise) 90 %    Oxygen Saturation (Exit) 94 %    Rating of Perceived Exertion (Exercise) 14    Perceived Dyspnea (Exercise) 2    Duration Continue with 30 min of aerobic exercise without signs/symptoms of physical distress.    Intensity THRR unchanged      Progression   Progression Continue to progress workloads to maintain intensity without signs/symptoms of physical distress.      Resistance Training    Training Prescription Yes    Weight 5    Reps 10-15      Treadmill   MPH 2.1    Grade 2    Minutes 15    METs 3.19      REL-XR   Level 2    Speed 66    Minutes 15    METs 4.3          Nutrition:  Target Goals: Understanding of nutrition guidelines, daily intake of sodium 1500mg , cholesterol 200mg , calories 30% from fat and 7% or less from saturated fats, daily to have 5 or more servings of fruits and vegetables.  Biometrics:  Pre Biometrics - 10/14/23 1515       Pre Biometrics   Height 5' 7 (1.702 m)    Weight 105.7 kg    Waist Circumference 50 inches    Hip Circumference 41 inches    Waist to Hip Ratio 1.22 %    BMI (Calculated) 36.49    Grip Strength 26.7 kg    Single Leg Stand 4 seconds           Nutrition Therapy Plan and Nutrition Goals:   Nutrition Assessments:  Nutrition Assessments - 10/14/23 1517       Rate Your Plate Scores   Pre Score 0.89         MEDIFICTS Score Key: >=70 Need to make dietary changes  40-70 Heart Healthy Diet <= 40 Therapeutic Level Cholesterol Diet  Flowsheet Row PULMONARY REHAB COPD ORIENTATION from 10/14/2023 in [facility] CARDIAC REHABILITATION  Picture Your Plate Total Score on Admission 77   Picture Your Plate Scores: <96 Unhealthy dietary pattern with much room for improvement. 41-50 Dietary pattern unlikely to meet recommendations  for good health and room for improvement. 51-60 More healthful dietary pattern, with some room for improvement.  >60 Healthy dietary pattern, although there may be some specific behaviors that could be improved.    Nutrition Goals Re-Evaluation:  Nutrition Goals Re-Evaluation     Row Name 10/27/23 1416 12/08/23 1404 01/01/24 1941         Goals   Current Weight -- 232 lb (105.2 kg) --     Nutrition Goal Continue to attend rehab and eat healthy. Continue to attend rehab and eat healthy. Short: Continue to eat healthy. Long: Him and his spouse achieve some weight loss.      Comment Jerome Sanchez says him and his partner overall try to eat a healthy diet. They did have a cheat weekend at the [facility], but they split the cheesecake. Jerome Sanchez states him and his spouse have had a complete change of how they eat. His spouse enrolled in [place] [facility], so they are teaching them portion control. They also fast for 16 hours as part of their diet. Jerome Sanchez has been doing well with his diet.  He is eating better than when he started.  He is working with his wide to work on portion control.  He is eating mainly 1 main meal a day.     Expected Outcome Short: Eat healthy. Long term: Lose some weight and work on portion control. Short: Continue to eat healthy. Long: Him and his spouse achieve some weight loss. Short; continue to work on portion control Long: Continue to follow healthy eating        Nutrition Goals Discharge (Final Nutrition Goals Re-Evaluation):  Nutrition Goals Re-Evaluation - 01/01/24 1941       Goals   Nutrition Goal Short: Continue to eat healthy. Long: Him and his spouse achieve some weight loss.    Comment Jerome Sanchez has been doing well with his diet.  He is eating better than when he started.  He is working with his wide to work on portion control.  He is eating mainly 1 main meal a day.    Expected Outcome Short; continue to work on portion control Long: Continue to follow healthy eating          Psychosocial: Target Goals: Acknowledge presence or absence of significant depression and/or stress, maximize coping skills, provide positive support system. Participant is able to verbalize types and ability to use techniques and skills needed for reducing stress and depression.  Initial Review & Psychosocial Screening:  Initial Psych Review & Screening - 10/14/23 1432       Initial Review   Current issues with Current Sleep Concerns   Wakes up once every night- falls back asleep within the hour.     Family Dynamics   Good Support System? Yes     Comments Husband Jerome Sanchez is support system.      Barriers   Psychosocial barriers to participate in program There are no identifiable barriers or psychosocial needs.;The patient should benefit from training in stress management and relaxation.      Screening Interventions   Interventions Encouraged to exercise;Provide feedback about the scores to participant;To provide support and resources with identified psychosocial needs    Expected Outcomes Short Term goal: Utilizing psychosocial counselor, staff and physician to assist with identification of specific Stressors or current issues interfering with healing process. Setting desired goal for each stressor or current issue identified.;Long Term Goal: Stressors or current issues are controlled or eliminated.;Short Term goal: Identification and review  with participant of any Quality of Life or Depression concerns found by scoring the questionnaire.;Long Term goal: The participant improves quality of Life and PHQ9 Scores as seen by post scores and/or verbalization of changes          Quality of Life Scores:  Scores of 19 and below usually indicate a poorer quality of life in these areas.  A difference of  2-3 points is a clinically meaningful difference.  A difference of 2-3 points in the total score of the Quality of Life Index has been associated with significant improvement in overall quality of life, self-image, physical symptoms, and general health in studies assessing change in quality of life.   PHQ-9: Review Flowsheet  More data exists      10/12/2023 06/19/2023 06/09/2023 05/13/2023 03/10/2023  Depression screen PHQ 2/9  Decreased Interest 0 0 0 0 0  Down, Depressed, Hopeless 0 0 0 0 0  PHQ - 2 Score 0 0 0 0 0  Altered sleeping 1 0 0 - 0  Tired, decreased energy - 0 0 - 0  Change in appetite 0 0 0 - 0  Feeling bad or failure about yourself  0 0 0 - 0  Trouble concentrating 0 0 0 - 0  Moving slowly or fidgety/restless 0 0 0 - 0   Suicidal thoughts 0 0 0 - 0  PHQ-9 Score 1 0 0 - 0  Difficult doing work/chores Not difficult at all Not difficult at all Not difficult at all - -   Interpretation of Total Score  Total Score Depression Severity:  1-4 = Minimal depression, 5-9 = Mild depression, 10-14 = Moderate depression, 15-19 = Moderately severe depression, 20-27 = Severe depression   Psychosocial Evaluation and Intervention:  Psychosocial Evaluation - 10/14/23 1524       Psychosocial Evaluation & Interventions   Interventions Encouraged to exercise with the program and follow exercise prescription;Relaxation education;Stress management education    Comments Jerome Sanchez was referred to this program for his diagnosis of COPD. Currently he gets short of breath with minimal activities, yet he wants to improve this to not be as short of breath as often. 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 He occasionally will get dizzy first thing in the mornings, but it resolves shortly after sitting on the side of the bed. He denies any stressors in his life, his support system consists of his signifcant other, Jerome Sanchez. Nadim is wanting to regain his strength and stamina and not be so short of breath as a result of this program. No barries identified to complete the program.    Continue Psychosocial Services  Follow up required by staff          Psychosocial Re-Evaluation:  Psychosocial Re-Evaluation     Row Name 10/27/23 1414 12/08/23 1402 01/01/24 1125         Psychosocial Re-Evaluation   Current issues with None Identified None Identified None Identified     Comments Jerome Sanchez states that his stress level is at a good level, no current life stressors. He says he is sleeping well and his support system is great. Hillel states that he currently doesn't have any stress and that he is sleeping well. He has a great support system which is his spouse. Juda is doing  well in rehab.  He is feeling good mentally and enjoys coming to class. He is still sleeping well.  Expected Outcomes Short: Continue to attend rehab. Long: Continue to have as few stressors in life as possible and continue sleeping well. Short: Continue to attend rehab. Long: Continue the stress relief techniques that are working in his life. Short: Continue to exercise for mental boost Long: Continue to stay positive     Interventions Encouraged to attend Pulmonary Rehabilitation for the exercise Encouraged to attend Pulmonary Rehabilitation for the exercise Encouraged to attend Pulmonary Rehabilitation for the exercise     Continue Psychosocial Services  Follow up required by staff Follow up required by staff Follow up required by staff        Psychosocial Discharge (Final Psychosocial Re-Evaluation):  Psychosocial Re-Evaluation - 01/01/24 1125       Psychosocial Re-Evaluation   Current issues with None Identified    Comments Jerome Sanchez is doing well in rehab.  He is feeling good mentally and enjoys coming to class. He is still sleeping well.    Expected Outcomes Short: Continue to exercise for mental boost Long: Continue to stay positive    Interventions Encouraged to attend Pulmonary Rehabilitation for the exercise    Continue Psychosocial Services  Follow up required by staff           Education: Education Goals: Education classes will be provided on a weekly basis, covering required topics. Participant will state understanding/return demonstration of topics presented.  Learning Barriers/Preferences:  Learning Barriers/Preferences - 10/14/23 1435       Learning Barriers/Preferences   Learning Barriers None    Learning Preferences Written Material;Video;Individual Instruction;Group Instruction          Education Topics: How Lungs Work and Diseases: - Discuss the anatomy of the lungs and diseases that can affect the lungs, such as COPD. Flowsheet Row PULMONARY REHAB CHRONIC  OBSTRUCTIVE PULMONARY DISEASE from 01/14/2024 in [facility]  Date 11/19/23  Educator Hb  Instruction Review Code 1- Verbalizes Understanding    Exercise: -Discuss the importance of exercise, FITT principles of exercise, normal and abnormal responses to exercise, and how to exercise safely.   Environmental Irritants: -Discuss types of environmental irritants and how to limit exposure to environmental irritants.   Meds/Inhalers and oxygen: - Discuss respiratory medications, definition of an inhaler and oxygen, and the proper way to use an inhaler and oxygen.   Energy Saving Techniques: - Discuss methods to conserve energy and decrease shortness of breath when performing activities of daily living.    Bronchial Hygiene / Breathing Techniques: - Discuss breathing mechanics, pursed-lip breathing technique,  proper posture, effective ways to clear airways, and other functional breathing techniques   Cleaning Equipment: - Provides group verbal and written instruction about the health risks of elevated stress, cause of high stress, and healthy ways to reduce stress.   Nutrition I: Fats: - Discuss the types of cholesterol, what cholesterol does to the body, and how cholesterol levels can be controlled. Flowsheet Row PULMONARY REHAB CHRONIC OBSTRUCTIVE PULMONARY DISEASE from 01/14/2024 in [place] [facility]  Date 10/22/23  Educator [facility]  Instruction Review Code 1- Verbalizes Understanding    Nutrition II: Labels: -Discuss the different components of food labels and how to read food labels. Flowsheet Row PULMONARY REHAB CHRONIC OBSTRUCTIVE PULMONARY DISEASE from 01/14/2024 in [place] [facility]  Date 12/24/23  Educator HB  Instruction Review Code 1- Verbalizes Understanding    Respiratory Infections: - Discuss the signs and symptoms of respiratory infections, ways to prevent respiratory infections, and the importance of seeking  medical treatment when  having a respiratory infection.   Stress I: Signs and Symptoms: - Discuss the causes of stress, how stress may lead to anxiety and depression, and ways to limit stress. Flowsheet Row PULMONARY REHAB CHRONIC OBSTRUCTIVE PULMONARY DISEASE from 01/14/2024 in [place] [facility]  Date 10/29/23  Educator DJ  Instruction Review Code 1- Verbalizes Understanding    Stress II: Relaxation: -Discuss relaxation techniques to limit stress. Flowsheet Row PULMONARY REHAB CHRONIC OBSTRUCTIVE PULMONARY DISEASE from 01/14/2024 in [place] [facility]  Date 10/15/23  Educator [facility]  Instruction Review Code 1- Verbalizes Understanding    Oxygen for Home/Travel: - Discuss how to prepare for travel when on oxygen and proper ways to transport and store oxygen to ensure safety.   Knowledge Questionnaire Score:  Knowledge Questionnaire Score - 10/14/23 1541       Knowledge Questionnaire Score   Pre Score 16/18          Core Components/Risk Factors/Patient Goals at Admission:  Personal Goals and Risk Factors at Admission - 10/14/23 1519       Core Components/Risk Factors/Patient Goals on Admission    Weight Management Yes    Intervention Weight Management: Develop a combined nutrition and exercise program designed to reach desired caloric intake, while maintaining appropriate intake of nutrient and fiber, sodium and fats, and appropriate energy expenditure required for the weight goal.;Weight Management: Provide education and appropriate resources to help participant work on and attain dietary goals.;Weight Management/Obesity: Establish reasonable short term and long term weight goals.    Admit Weight 233 lb 0.4 oz (105.7 kg)    Improve shortness of breath with ADL's Yes    Intervention Provide education, individualized exercise plan and daily activity instruction to help decrease symptoms of SOB with activities of daily living.    Expected Outcomes  Short Term: Improve cardiorespiratory fitness to achieve a reduction of symptoms when performing ADLs    Increase knowledge of respiratory medications and ability to use respiratory devices properly  Yes    Intervention Provide education and demonstration as needed of appropriate use of medications, inhalers, and oxygen therapy.    Expected Outcomes Short Term: Achieves understanding of medications use. Understands that oxygen is a medication prescribed by physician. Demonstrates appropriate use of inhaler and oxygen therapy.;Long Term: Maintain appropriate use of medications, inhalers, and oxygen therapy.    Diabetes Yes    Intervention Provide education about signs/symptoms and action to take for hypo/hyperglycemia.;Provide education about proper nutrition, including hydration, and aerobic/resistive exercise prescription along with prescribed medications to achieve blood glucose in normal ranges: Fasting glucose 65-99 mg/dL    Expected Outcomes Short Term: Participant verbalizes understanding of the signs/symptoms and immediate care of hyper/hypoglycemia, proper foot care and importance of medication, aerobic/resistive exercise and nutrition plan for blood glucose control.;Long Term: Attainment of HbA1C < 7%.    Hypertension Yes    Intervention Monitor prescription use compliance.;Provide education on lifestyle modifcations including regular physical activity/exercise, weight management, moderate sodium restriction and increased consumption of fresh fruit, vegetables, and low fat dairy, alcohol moderation, and smoking cessation.    Expected Outcomes Long Term: Maintenance of blood pressure at goal levels.;Short Term: Continued assessment and intervention until BP is < 140/39mm HG in hypertensive participants. < 130/48mm HG in hypertensive participants with diabetes, heart failure or chronic kidney disease.    Lipids Yes    Intervention Provide education and support for participant on nutrition &  aerobic/resistive exercise along with prescribed medications to achieve LDL 70mg , HDL >40mg .  Expected Outcomes Short Term: Participant states understanding of desired cholesterol values and is compliant with medications prescribed. Participant is following exercise prescription and nutrition guidelines.;Long Term: Cholesterol controlled with medications as prescribed, with individualized exercise RX and with personalized nutrition plan. Value goals: LDL < 70mg , HDL > 40 mg.          Core Components/Risk Factors/Patient Goals Review:   Goals and Risk Factor Review     Row Name 10/27/23 1418 12/08/23 1406 01/01/24 1943         Core Components/Risk Factors/Patient Goals Review   Personal Goals Review Weight Management/Obesity;Increase knowledge of respiratory medications and ability to use respiratory devices properly.;Lipids Weight Management/Obesity;Increase knowledge of respiratory medications and ability to use respiratory devices properly.;Lipids;Hypertension Weight Management/Obesity;Increase knowledge of respiratory medications and ability to use respiratory devices properly.;Lipids;Hypertension;Improve shortness of breath with ADL's     Review Jerome Sanchez said his breathing has been a little better since attending the program. He does not have a pulse oximeter to check his sat's at home but he is planning on buying one in the near future. His BP runs stable like it does here. He is taking his medications like he is supposed to. Foye states he is taking his medications as he should at home. He states he checks his pulse ox several times a day and it is in the mid 90's. Jerome Sanchez is doing well in rehab.  He is starting to lose weight.  His pressures are doing well.  His breathing has gotten a little better.  His meds are doing welll. He wants to continue to improve     Expected Outcomes Short term: Continue to attend rehab. Long term: Breathing continue to gets better. Short term: Continue to attend  rehab. Long term: Breathing continue to gets better. Short: Continue to work on weight loss lOng: Contineu to monitor breathing        Core Components/Risk Factors/Patient Goals at Discharge (Final Review):   Goals and Risk Factor Review - 01/01/24 1943       Core Components/Risk Factors/Patient Goals Review   Personal Goals Review Weight Management/Obesity;Increase knowledge of respiratory medications and ability to use respiratory devices properly.;Lipids;Hypertension;Improve shortness of breath with ADL's    Review Jerome Sanchez is doing well in rehab.  He is starting to lose weight.  His pressures are doing well.  His breathing has gotten a little better.  His meds are doing welll. He wants to continue to improve    Expected Outcomes Short: Continue to work on weight loss lOng: Contineu to monitor breathing          ITP Comments:  ITP Comments     Row Name 10/14/23 1533 10/15/23 1409 11/11/23 1145 12/09/23 1446 01/06/24 1130   ITP Comments Patient attend orientation today.  Patient is attending Pulmonary Rehabilitation Program.  Documentation for diagnosis can be found in CH[facility]/EPIC.  Reviewed medical chart, RPE/RPD, gym safety, and program guidelines.  Patient was fitted to equipment they will be using during rehab.  Patient is scheduled to start exercise on 10/15/23.   Initial ITP created and sent for review and signature by Dr. Gwendalyn Lemma, Medical Director for Pulmonary Rehabilitation Program. First full day of exercise!  Patient was oriented to gym and equipment including functions, settings, policies, and procedures.  Patient's individual exercise prescription and treatment plan were reviewed.  All starting workloads were established based on the results of the 6 minute walk test done at initial orientation visit.  The plan for exercise progression  was also introduced and progression will be customized based on patient's performance and goals. 30 day review completed. ITP sent to Dr.Jehanzeb  Memon, Medical Director of  Pulmonary Rehab. Continue with ITP unless changes are made by physician. 30 day review completed. ITP sent to Dr.Jehanzeb Memon, Medical Director of  Pulmonary Rehab. Continue with ITP unless changes are made by physician. 30 day review completed. ITP sent to Dr.Jehanzeb Memon, Medical Director of  Pulmonary Rehab. Continue with ITP unless changes are made by physician.    Row Name 02/03/24 1027           ITP Comments 30 day review completed. ITP sent to Dr.Jehanzeb Memon, Medical Director of  Pulmonary Rehab. Continue with ITP unless changes are made by physician. Patient has missed 2 weeks on vacation. Not able to assess goals this review period.          Comments: 30 Day Review

## 2024-02-04 ENCOUNTER — Ambulatory Visit (HOSPITAL_COMMUNITY)
Admission: RE | Admit: 2024-02-04 | Discharge: 2024-02-04 | Disposition: A | Discharge status: Home / Self Care | Payer: PPO | Source: Ambulatory Visit | Attending: Internal Medicine | Admitting: Internal Medicine

## 2024-02-04 ENCOUNTER — Encounter (HOSPITAL_COMMUNITY)
Admission: RE | Admit: 2024-02-04 | Discharge: 2024-02-04 | Disposition: A | Discharge status: Home / Self Care | Payer: PPO | Source: Ambulatory Visit | Attending: Internal Medicine | Admitting: Internal Medicine

## 2024-02-04 DIAGNOSIS — I350 Nonrheumatic aortic (valve) stenosis: Secondary | ICD-10-CM | POA: Diagnosis not present

## 2024-02-04 DIAGNOSIS — J449 Chronic obstructive pulmonary disease, unspecified: Secondary | ICD-10-CM

## 2024-02-04 LAB — ECHOCARDIOGRAM COMPLETE
AR max vel: 1.13 cm2
AV Area VTI: 1.13 cm2
AV Area mean vel: 1.05 cm2
AV Mean grad: 21.7 mmHg
AV Peak grad: 40.4 mmHg
Ao pk vel: 3.18 m/s
Area-P 1/2: 2.14 cm2
S' Lateral: 3.5 cm

## 2024-02-04 NOTE — Progress Notes (Signed)
 Daily Session Note  Patient Details  Name: Jerome Sanchez MRN: [id number] Date of Birth: September 26, 1950 Referring Provider:   Flowsheet Row PULMONARY REHAB COPD ORIENTATION from 10/14/2023 in [facility] CARDIAC REHABILITATION  Referring Provider Vernestine Gondola MD    Encounter Date: 02/04/2024  Check In:  Session Check In - 02/04/24 1327       Check-In   Supervising physician immediately available to respond to emergencies See telemetry face sheet for immediately available MD    Location [facility]    Staff Present Clotilda Danish, BS, Exercise Physiologist;Danny Claudean Crumbly, RN, BSN;Other    Virtual Visit No    Medication changes reported     No    Fall or balance concerns reported    No    Tobacco Cessation No Change    Warm-up and Cool-down Performed on first and last piece of equipment    Resistance Training Performed Yes    VAD Patient? No    PAD/SET Patient? No      Pain Assessment   Currently in Pain? No/denies    Multiple Pain Sites No          Capillary Blood Glucose: No results found for this or any previous visit (from the past 24 hours).    Social History   Tobacco Use  Smoking Status Former   Current packs/day: 0.00   Average packs/day: 1.3 packs/day for 51.0 years (63.8 ttl pk-yrs)   Types: Cigarettes   Start date: 65   Quit date: 2018   Years since quitting: 7.4  Smokeless Tobacco Never    Goals Met:  Independence with exercise equipment Using PLB without cueing & demonstrates good technique Exercise tolerated well No report of concerns or symptoms today Strength training completed today  Goals Unmet:  Not Applicable  Comments: Pt able to follow exercise prescription today without complaint.  Will continue to monitor for progression.

## 2024-02-04 NOTE — Progress Notes (Signed)
*  PRELIMINARY RESULTS* Echocardiogram 2D Echocardiogram has been performed.  Jerome Sanchez 02/04/2024, 1:11 PM

## 2024-02-06 DIAGNOSIS — G4733 Obstructive sleep apnea (adult) (pediatric): Secondary | ICD-10-CM | POA: Diagnosis not present

## 2024-02-09 ENCOUNTER — Encounter (HOSPITAL_COMMUNITY)
Admission: RE | Admit: 2024-02-09 | Discharge: 2024-02-09 | Disposition: A | Discharge status: Home / Self Care | Payer: PPO | Source: Ambulatory Visit | Attending: Internal Medicine | Admitting: Internal Medicine

## 2024-02-09 DIAGNOSIS — J449 Chronic obstructive pulmonary disease, unspecified: Secondary | ICD-10-CM | POA: Diagnosis not present

## 2024-02-09 NOTE — Progress Notes (Signed)
 Daily Session Note  Patient Details  Name: TIPTON BALLOW MRN: [id number] Date of Birth: 1951/03/18 Referring Provider:   Flowsheet Row PULMONARY REHAB COPD ORIENTATION from 10/14/2023 in [facility] CARDIAC REHABILITATION  Referring Provider Darlean Sharper MD    Encounter Date: 02/09/2024  Check In:  Session Check In - 02/09/24 1330       Check-In   Supervising physician immediately available to respond to emergencies See telemetry face sheet for immediately available MD    Location [facility]    Staff Present Powell Benders, BS, Exercise Physiologist;Danny Siri, RN, BSN;Brittany Jackquline, BSN, RN, WTA-C;Telicia Hodgkiss, RN    Virtual Visit No    Medication changes reported     No    Fall or balance concerns reported    No    Warm-up and Cool-down Performed on first and last piece of equipment    Resistance Training Performed Yes    VAD Patient? No    PAD/SET Patient? No      Pain Assessment   Currently in Pain? No/denies    Multiple Pain Sites No          Capillary Blood Glucose: No results found for this or any previous visit (from the past 24 hours).    Social History   Tobacco Use  Smoking Status Former   Current packs/day: 0.00   Average packs/day: 1.3 packs/day for 51.0 years (63.8 ttl pk-yrs)   Types: Cigarettes   Start date: 30   Quit date: 2018   Years since quitting: 7.4  Smokeless Tobacco Never    Goals Met:  Proper associated with RPD/PD & O2 Sat Independence with exercise equipment Using PLB without cueing & demonstrates good technique Exercise tolerated well No report of concerns or symptoms today Strength training completed today  Goals Unmet:  Not Applicable  Comments: Pt able to follow exercise prescription today without complaint.  Will continue to monitor for progression.

## 2024-02-11 ENCOUNTER — Encounter (HOSPITAL_COMMUNITY)
Admission: RE | Admit: 2024-02-11 | Discharge: 2024-02-11 | Disposition: A | Discharge status: Home / Self Care | Payer: PPO | Source: Ambulatory Visit | Attending: Internal Medicine | Admitting: Internal Medicine

## 2024-02-11 DIAGNOSIS — J449 Chronic obstructive pulmonary disease, unspecified: Secondary | ICD-10-CM

## 2024-02-11 NOTE — Progress Notes (Signed)
 Daily Session Note  Patient Details  Name: Jerome Sanchez MRN: [id number] Date of Birth: 1951/06/10 Referring Provider:   Flowsheet Row PULMONARY REHAB COPD ORIENTATION from 10/14/2023 in [facility] CARDIAC REHABILITATION  Referring Provider Darlean Sharper MD    Encounter Date: 02/11/2024  Check In:  Session Check In - 02/11/24 1333       Check-In   Supervising physician immediately available to respond to emergencies See telemetry face sheet for immediately available MD    Location [facility]    Staff Present Rolland Sake BSN, RN;Heather Con, [place], Exercise Physiologist;Debra Vicci, RN, BSN;Other   [place] Redd, [place]   Virtual Visit No    Medication changes reported     No    Fall or balance concerns reported    No    Tobacco Cessation No Change    Warm-up and Cool-down Performed on first and last piece of equipment    Resistance Training Performed Yes    VAD Patient? No    PAD/SET Patient? No      Pain Assessment   Currently in Pain? No/denies    Multiple Pain Sites No          Capillary Blood Glucose: No results found for this or any previous visit (from the past 24 hours).    Social History   Tobacco Use  Smoking Status Former   Current packs/day: 0.00   Average packs/day: 1.3 packs/day for 51.0 years (63.8 ttl pk-yrs)   Types: Cigarettes   Start date: 46   Quit date: 2018   Years since quitting: 7.4  Smokeless Tobacco Never    Goals Met:  Proper associated with RPD/PD & O2 Sat Independence with exercise equipment Using PLB without cueing & demonstrates good technique Exercise tolerated well Queuing for purse lip breathing No report of concerns or symptoms today Strength training completed today  Goals Unmet:  Not Applicable  Comments: [id number] able to follow exercise prescription today without complaint.  Will continue to monitor for progression.

## 2024-02-13 ENCOUNTER — Other Ambulatory Visit: Payer: Self-pay | Admitting: Internal Medicine

## 2024-02-15 DIAGNOSIS — N189 Chronic kidney disease, unspecified: Secondary | ICD-10-CM | POA: Diagnosis not present

## 2024-02-15 DIAGNOSIS — E211 Secondary hyperparathyroidism, not elsewhere classified: Secondary | ICD-10-CM | POA: Diagnosis not present

## 2024-02-15 DIAGNOSIS — D631 Anemia in chronic kidney disease: Secondary | ICD-10-CM | POA: Diagnosis not present

## 2024-02-15 DIAGNOSIS — R809 Proteinuria, unspecified: Secondary | ICD-10-CM | POA: Diagnosis not present

## 2024-02-16 ENCOUNTER — Encounter (HOSPITAL_COMMUNITY): Admission: RE | Admit: 2024-02-16 | Source: Ambulatory Visit

## 2024-02-18 ENCOUNTER — Encounter (HOSPITAL_COMMUNITY)
Admission: RE | Admit: 2024-02-18 | Discharge: 2024-02-18 | Disposition: A | Discharge status: Home / Self Care | Source: Ambulatory Visit | Attending: Internal Medicine | Admitting: Internal Medicine

## 2024-02-18 DIAGNOSIS — J449 Chronic obstructive pulmonary disease, unspecified: Secondary | ICD-10-CM | POA: Insufficient documentation

## 2024-02-18 NOTE — Progress Notes (Signed)
 Daily Session Note  Patient Details  Name: Jerome Sanchez MRN: [id number] Date of Birth: Jan 04, 1951 Referring Provider:   Flowsheet Row PULMONARY REHAB COPD ORIENTATION from 10/14/2023 in [facility] CARDIAC REHABILITATION  Referring Provider Darlean Sharper MD    Encounter Date: 02/18/2024  Check In:  Session Check In - 02/18/24 1315       Check-In   Supervising physician immediately available to respond to emergencies See telemetry face sheet for immediately available MD    Location [facility]    Staff Present Adrien Louder, RN, BSN;Heather Con [place], Exercise Physiologist;Hillary Dean BSN, RN    Virtual Visit No    Medication changes reported     No    Fall or balance concerns reported    No    Warm-up and Cool-down Performed on first and last piece of equipment    Resistance Training Performed Yes    VAD Patient? No    PAD/SET Patient? No      Pain Assessment   Currently in Pain? No/denies    Multiple Pain Sites No          Capillary Blood Glucose: No results found for this or any previous visit (from the past 24 hours).    Social History   Tobacco Use  Smoking Status Former   Current packs/day: 0.00   Average packs/day: 1.3 packs/day for 51.0 years (63.8 ttl pk-yrs)   Types: Cigarettes   Start date: 50   Quit date: 2018   Years since quitting: 7.5  Smokeless Tobacco Never    Goals Met:  Proper associated with RPD/PD & O2 Sat Independence with exercise equipment Using PLB without cueing & demonstrates good technique Exercise tolerated well No report of concerns or symptoms today Strength training completed today  Goals Unmet:  Not Applicable  Comments: Pt able to follow exercise prescription today without complaint.  Will continue to monitor for progression.

## 2024-02-22 DIAGNOSIS — R809 Proteinuria, unspecified: Secondary | ICD-10-CM | POA: Diagnosis not present

## 2024-02-22 DIAGNOSIS — E1122 Type 2 diabetes mellitus with diabetic chronic kidney disease: Secondary | ICD-10-CM | POA: Diagnosis not present

## 2024-02-22 DIAGNOSIS — N1831 Chronic kidney disease, stage 3a: Secondary | ICD-10-CM | POA: Diagnosis not present

## 2024-02-22 DIAGNOSIS — I129 Hypertensive chronic kidney disease with stage 1 through stage 4 chronic kidney disease, or unspecified chronic kidney disease: Secondary | ICD-10-CM | POA: Diagnosis not present

## 2024-02-23 ENCOUNTER — Encounter: Payer: Self-pay | Admitting: Family Medicine

## 2024-02-23 ENCOUNTER — Encounter (HOSPITAL_COMMUNITY)
Admission: RE | Admit: 2024-02-23 | Discharge: 2024-02-23 | Disposition: A | Discharge status: Home / Self Care | Source: Ambulatory Visit | Attending: Internal Medicine | Admitting: Internal Medicine

## 2024-02-23 DIAGNOSIS — J449 Chronic obstructive pulmonary disease, unspecified: Secondary | ICD-10-CM | POA: Diagnosis not present

## 2024-02-23 NOTE — Progress Notes (Signed)
 Daily Session Note  Patient Details  Name: Jerome Sanchez MRN: [id number] Date of Birth: 1951/02/11 Referring Provider:   Flowsheet Row PULMONARY REHAB COPD ORIENTATION from 10/14/2023 in [facility] CARDIAC REHABILITATION  Referring Provider Darlean Sharper MD    Encounter Date: 02/23/2024  Check In:  Session Check In - 02/23/24 1331       Check-In   Supervising physician immediately available to respond to emergencies See telemetry face sheet for immediately available MD    Location [facility]    Staff Present Laymon Rattler, BSN, RN, WTA-C;Heather Con, BS, Exercise Physiologist;Jessica Vonzell, MA, RCEP, CCRP, CCET    Virtual Visit No    Medication changes reported     No    Fall or balance concerns reported    No    Tobacco Cessation No Change    Warm-up and Cool-down Performed on first and last piece of equipment    Resistance Training Performed Yes    VAD Patient? No    PAD/SET Patient? No      Pain Assessment   Currently in Pain? No/denies          Capillary Blood Glucose: No results found for this or any previous visit (from the past 24 hours).    Social History   Tobacco Use  Smoking Status Former   Current packs/day: 0.00   Average packs/day: 1.3 packs/day for 51.0 years (63.8 ttl pk-yrs)   Types: Cigarettes   Start date: 41   Quit date: 2018   Years since quitting: 7.5  Smokeless Tobacco Never    Goals Met:  Proper associated with RPD/PD & O2 Sat Independence with exercise equipment Improved SOB with ADL's Using PLB without cueing & demonstrates good technique Exercise tolerated well No report of concerns or symptoms today Strength training completed today  Goals Unmet:  Not Applicable  Comments: Pt able to follow exercise prescription today without complaint.  Will continue to monitor for progression.

## 2024-02-25 ENCOUNTER — Encounter (HOSPITAL_COMMUNITY)
Admission: RE | Admit: 2024-02-25 | Discharge: 2024-02-25 | Disposition: A | Discharge status: Home / Self Care | Source: Ambulatory Visit | Attending: Internal Medicine | Admitting: Internal Medicine

## 2024-02-25 VITALS — Ht 67.0 in | Wt 221.8 lb

## 2024-02-25 DIAGNOSIS — J449 Chronic obstructive pulmonary disease, unspecified: Secondary | ICD-10-CM | POA: Diagnosis not present

## 2024-02-25 NOTE — Progress Notes (Signed)
 Daily Session Note  Patient Details  Name: Jerome Sanchez MRN: [id number] Date of Birth: 01-Jul-1951 Referring Provider:   Flowsheet Row PULMONARY REHAB COPD ORIENTATION from 10/14/2023 in [facility] CARDIAC REHABILITATION  Referring Provider Darlean Sharper MD    Encounter Date: 02/25/2024  Check In:   Capillary Blood Glucose: No results found for this or any previous visit (from the past 24 hours).    Social History   Tobacco Use  Smoking Status Former   Current packs/day: 0.00   Average packs/day: 1.3 packs/day for 51.0 years (63.8 ttl pk-yrs)   Types: Cigarettes   Start date: 6   Quit date: 2018   Years since quitting: 7.5  Smokeless Tobacco Never    Goals Met:  Independence with exercise equipment Using PLB without cueing & demonstrates good technique Exercise tolerated well No report of concerns or symptoms today Strength training completed today  Goals Unmet:  Not Applicable  Comments: Pt able to follow exercise prescription today without complaint.  Will continue to monitor for progression.

## 2024-02-25 NOTE — Telephone Encounter (Signed)
 Nurses Please order PSA, lipid, liver, metabolic 7, A1c, urine ACR, B12 Diabetes, hyperlipidemia, high risk med, prostate cancer screening, B12 deficiency

## 2024-02-25 NOTE — Patient Instructions (Signed)
 Discharge Patient Instructions  Patient Details  Name: Jerome Sanchez MRN: [id number] Date of Birth: 1950/09/27 Referring Provider:  Cook, Jayce G, DO   Number of Visits: 78  Reason for Discharge:  Patient reached a stable level of exercise. Patient independent in their exercise. Patient has met program and personal goals.  Smoking History:  Social History   Tobacco Use  Smoking Status Former   Current packs/day: 0.00   Average packs/day: 1.3 packs/day for 51.0 years (63.8 ttl pk-yrs)   Types: Cigarettes   Start date: 52   Quit date: 2018   Years since quitting: 7.5  Smokeless Tobacco Never    Diagnosis:  Chronic obstructive pulmonary disease, unspecified COPD type (HCC)  Initial Exercise Prescription:  Initial Exercise Prescription - 10/14/23 1500       Date of Initial Exercise RX and Referring Provider   Date 10/14/23    Referring Provider Darlean Sharper MD      Oxygen   Maintain Oxygen Saturation 88% or higher      Treadmill   MPH 1.5    Grade 0    Minutes 15    METs 1.9      REL-XR   Level 1    Speed 50    Minutes 15    METs 1.8      Prescription Details   Frequency (times per week) 2    Duration Progress to 30 minutes of continuous aerobic without signs/symptoms of physical distress      Intensity   THRR 40-80% of Max Heartrate 102/131    Ratings of Perceived Exertion 11-13    Perceived Dyspnea 0-4      Resistance Training   Training Prescription Yes    Weight 4    Reps 10-15          Discharge Exercise Prescription (Final Exercise Prescription Changes):  Exercise Prescription Changes - 01/19/24 1500       Response to Exercise   Blood Pressure (Admit) 112/62    Blood Pressure (Exit) 110/50    Heart Rate (Admit) 75 bpm    Heart Rate (Exercise) 111 bpm    Heart Rate (Exit) 90 bpm    Oxygen Saturation (Admit) 92 %    Oxygen Saturation (Exercise) 90 %    Oxygen Saturation (Exit) 94 %    Rating of Perceived Exertion (Exercise) 14     Perceived Dyspnea (Exercise) 2    Duration Continue with 30 min of aerobic exercise without signs/symptoms of physical distress.    Intensity THRR unchanged      Progression   Progression Continue to progress workloads to maintain intensity without signs/symptoms of physical distress.      Resistance Training   Training Prescription Yes    Weight 5    Reps 10-15      Treadmill   MPH 2.1    Grade 2    Minutes 15    METs 3.19      REL-XR   Level 2    Speed 66    Minutes 15    METs 4.3          Functional Capacity:  6 Minute Walk     Row Name 10/14/23 1510 10/14/23 1511 02/25/24 1350     6 Minute Walk   Phase Initial -- Discharge   Distance 1200 feet -- 1330 feet   Walk Time 6 minutes -- 6 minutes   # of Rest Breaks 0 -- 0   MPH 2.27 -- 2.52  METS 2.6 -- 2.5   RPE 13 -- 13   Perceived Dyspnea  2 -- 2   VO2 Peak 9.1 -- 8.75   Symptoms No -- No   Resting HR 73 bpm -- 70 bpm   Resting BP 130/80 -- 122/74   Resting Oxygen Saturation  92 % -- 94 %   Exercise Oxygen Saturation  during 6 min walk 96 % -- 87 %   Max Ex. HR 150 bpm -- 113 bpm   Max Ex. BP 142/80 -- 138/78   2 Minute Post BP 132/80 -- 120/76     Interval HR   1 Minute HR 133 -- 99   2 Minute HR 140 -- 102   3 Minute HR 146 -- 107   4 Minute HR 140 -- 110   5 Minute HR 143 -- 110   6 Minute HR 150 -- 113   2 Minute Post HR 86 -- 74   Interval Heart Rate? Yes -- Yes     Interval Oxygen   Interval Oxygen? -- Yes Yes   Baseline Oxygen Saturation % -- 92 % 97 %   1 Minute Oxygen Saturation % -- 88 % 91 %   1 Minute Liters of Oxygen -- 0 L 0 L   2 Minute Oxygen Saturation % -- 86 % 90 %   2 Minute Liters of Oxygen -- 0 L 0 L   3 Minute Oxygen Saturation % -- 86 % 90 %   3 Minute Liters of Oxygen -- 0 L 0 L   4 Minute Oxygen Saturation % -- 86 % 91 %   4 Minute Liters of Oxygen -- 0 L 0 L   5 Minute Oxygen Saturation % -- 87 % 90 %   5 Minute Liters of Oxygen -- 0 L 0 L   6 Minute Oxygen  Saturation % -- 87 % 87 %   6 Minute Liters of Oxygen -- 0 L 0 L   2 Minute Post Oxygen Saturation % -- 93 % 94 %   2 Minute Post Liters of Oxygen -- 0 L 0 L     Nutrition & Weight - Outcomes:  Pre Biometrics - 10/14/23 1515       Pre Biometrics   Height 5' 7 (1.702 m)    Weight 105.7 kg    Waist Circumference 50 inches    Hip Circumference 41 inches    Waist to Hip Ratio 1.22 %    BMI (Calculated) 36.49    Grip Strength 26.7 kg    Single Leg Stand 4 seconds          Post Biometrics - 02/25/24 1352        Post  Biometrics   Height 5' 7 (1.702 m)    Weight 100.6 kg    Waist Circumference 46 inches    Hip Circumference 47 inches    Waist to Hip Ratio 0.98 %    BMI (Calculated) 34.73    Grip Strength 31.4 kg    Single Leg Stand 6 seconds         Goals reviewed with patient; copy given to patient.

## 2024-02-26 ENCOUNTER — Other Ambulatory Visit: Payer: Self-pay

## 2024-02-26 DIAGNOSIS — N1831 Chronic kidney disease, stage 3a: Secondary | ICD-10-CM

## 2024-02-26 DIAGNOSIS — I10 Essential (primary) hypertension: Secondary | ICD-10-CM

## 2024-02-26 DIAGNOSIS — E538 Deficiency of other specified B group vitamins: Secondary | ICD-10-CM

## 2024-02-26 DIAGNOSIS — E118 Type 2 diabetes mellitus with unspecified complications: Secondary | ICD-10-CM

## 2024-02-26 DIAGNOSIS — Z125 Encounter for screening for malignant neoplasm of prostate: Secondary | ICD-10-CM

## 2024-02-26 DIAGNOSIS — Z79899 Other long term (current) drug therapy: Secondary | ICD-10-CM

## 2024-02-26 DIAGNOSIS — E782 Mixed hyperlipidemia: Secondary | ICD-10-CM

## 2024-03-01 ENCOUNTER — Encounter (HOSPITAL_COMMUNITY)
Admission: RE | Admit: 2024-03-01 | Discharge: 2024-03-01 | Disposition: A | Discharge status: Home / Self Care | Source: Ambulatory Visit | Attending: Internal Medicine | Admitting: Internal Medicine

## 2024-03-01 DIAGNOSIS — J449 Chronic obstructive pulmonary disease, unspecified: Secondary | ICD-10-CM | POA: Diagnosis not present

## 2024-03-01 NOTE — Progress Notes (Signed)
 Daily Session Note  Patient Details  Name: SWANSON FARNELL MRN: [id number] Date of Birth: [birth year] Referring Provider:   Flowsheet Row PULMONARY REHAB COPD ORIENTATION from 10/14/2023 in [facility] CARDIAC REHABILITATION  Referring Provider Darlean Sharper MD    Encounter Date: 03/01/2024  Check In:  Session Check In - 03/01/24 1315       Check-In   Supervising physician immediately available to respond to emergencies See telemetry face sheet for immediately available MD    Location [facility]    Staff Present Adrien Louder, RN, BSN;Heather Con, BS, Exercise Physiologist    Medication changes reported     No    Fall or balance concerns reported    No    Warm-up and Cool-down Performed on first and last piece of equipment    Resistance Training Performed Yes    VAD Patient? No    PAD/SET Patient? No      Pain Assessment   Currently in Pain? No/denies    Multiple Pain Sites No          Capillary Blood Glucose: No results found for this or any previous visit (from the past 24 hours).    Social History   Tobacco Use  Smoking Status Former   Current packs/day: 0.00   Average packs/day: 1.3 packs/day for 51.0 years (63.8 ttl pk-yrs)   Types: Cigarettes   Start date: 11   Quit date: 2018   Years since quitting: 7.5  Smokeless Tobacco Never    Goals Met:  Proper associated with RPD/PD & O2 Sat Independence with exercise equipment Using PLB without cueing & demonstrates good technique Exercise tolerated well No report of concerns or symptoms today Strength training completed today  Goals Unmet:  Not Applicable  Comments: Pt able to follow exercise prescription today without complaint.  Will continue to monitor for progression.

## 2024-03-02 ENCOUNTER — Encounter (HOSPITAL_COMMUNITY): Payer: Self-pay | Admitting: *Deleted

## 2024-03-02 DIAGNOSIS — J449 Chronic obstructive pulmonary disease, unspecified: Secondary | ICD-10-CM

## 2024-03-02 NOTE — Progress Notes (Signed)
 Pulmonary Individual Treatment Plan  Patient Details  Name: Jerome Sanchez MRN: [id number] Date of Birth: Nov 17, 1950 Referring Provider:   Flowsheet Row PULMONARY REHAB COPD ORIENTATION from 10/14/2023 in [facility] CARDIAC REHABILITATION  Referring Provider Darlean Sharper MD    Initial Encounter Date:  Flowsheet Row PULMONARY REHAB COPD ORIENTATION from 10/14/2023 in [facility] CARDIAC REHABILITATION  Date 10/14/23    Visit Diagnosis: Chronic obstructive pulmonary disease, unspecified COPD type (HCC)  Patient's Home Medications on Admission:   Current Outpatient Medications:    albuterol  (VENTOLIN  HFA) 108 (90 Base) MCG/ACT inhaler, INHALE 1 TO 2 PUFFS BY MOUTH EVERY 6 HOURS AS NEEDED FOR WHEEZING FOR SHORTNESS OF BREATH, Disp: 9 g, Rfl: 0   atorvastatin  (LIPITOR) 80 MG tablet, Take 1 tablet (80 mg total) by mouth daily., Disp: 100 tablet, Rfl: 2   dapagliflozin propanediol (FARXIGA) 5 MG TABS tablet, Take 5 mg by mouth daily., Disp: , Rfl:    ezetimibe  (ZETIA ) 10 MG tablet, Take 1 tablet (10 mg total) by mouth daily., Disp: 100 tablet, Rfl: 1   Fluticasone-Umeclidin-Vilant (TRELEGY ELLIPTA ) 100-62.5-25 MCG/ACT AEPB, INHALE 1 PUFF INTO LUNGS ONCE DAILY, Disp: 180 each, Rfl: 6   furosemide  (LASIX ) 40 MG tablet, Take 1 tablet by mouth once daily, Disp: 90 tablet, Rfl: 0   lansoprazole  (PREVACID ) 15 MG capsule, Take 1 capsule (15 mg total) by mouth as needed., Disp: 100 capsule, Rfl: 1   OVER THE COUNTER MEDICATION, Areds ( omega 3, lutein, zeaxanthin) take one a day, Disp: , Rfl:    valsartan  (DIOVAN ) 80 MG tablet, Take 1 tablet (80 mg total) by mouth daily., Disp: 100 tablet, Rfl: 3  Past Medical History: Past Medical History:  Diagnosis Date   Allergy    Arthritis    Asthma    Cardiac arrhythmia    Chronic kidney disease    COPD (chronic obstructive pulmonary disease) (HCC)    Essential hypertension, benign    GERD (gastroesophageal reflux disease)    Impaired glucose tolerance     Injury of right rotator cuff    Lumbar disc disease    Mixed hyperlipidemia    OSA (obstructive sleep apnea)    Sleep apnea    Type 2 diabetes mellitus (HCC)    Vitamin D deficiency     Tobacco Use: Social History   Tobacco Use  Smoking Status Former   Current packs/day: 0.00   Average packs/day: 1.3 packs/day for 51.0 years (63.8 ttl pk-yrs)   Types: Cigarettes   Start date: 84   Quit date: 2018   Years since quitting: 7.5  Smokeless Tobacco Never    Labs: Review Flowsheet  More data exists      Latest Ref Rng & Units 08/22/2021 03/05/2022 07/15/2022 06/22/2023 10/02/2023  Labs for ITP Cardiac and Pulmonary Rehab  Cholestrol 100 - 199 mg/dL 780  789  816  841  829   LDL (calc) 0 - 99 mg/dL 882  886  74  67  71   HDL-C >39 mg/dL 79  84  94  79  85   Trlycerides 0 - 149 mg/dL 867  76  81  62  72   Hemoglobin A1c 4.8 - 5.6 % 6.1  6.0  - 6.6  6.7     Capillary Blood Glucose: No results found for: GLUCAP   Pulmonary Assessment Scores:  Pulmonary Assessment Scores     Row Name 10/14/23 1522 02/25/24 1401       ADL UCSD  ADL Phase Entry Exit    SOB Score total 33 40    Rest 0 0    Walk 2 0    Stairs 3 2    Bath 1 1    Dress 0 1    Shop 1 2      CAT Score   CAT Score 14 15      mMRC Score   mMRC Score -- 2      UCSD: Self-administered rating of dyspnea associated with activities of daily living (ADLs) 6-point scale (0 = not at all to 5 = maximal or unable to do because of breathlessness)  Scoring Scores range from 0 to 120.  Minimally important difference is 5 units  CAT: CAT can identify the health impairment of COPD patients and is better correlated with disease progression.  CAT has a scoring range of zero to 40. The CAT score is classified into four groups of low (less than 10), medium (10 - 20), high (21-30) and very high (31-40) based on the impact level of disease on health status. A CAT score over 10 suggests significant symptoms.  A  worsening CAT score could be explained by an exacerbation, poor medication adherence, poor inhaler technique, or progression of COPD or comorbid conditions.  CAT MCID is 2 points  mMRC: mMRC (Modified Medical Research Council) Dyspnea Scale is used to assess the degree of baseline functional disability in patients of respiratory disease due to dyspnea. No minimal important difference is established. A decrease in score of 1 point or greater is considered a positive change.   Pulmonary Function Assessment:  Pulmonary Function Assessment - 01/27/24 1005       Initial Spirometry Results   FVC% 81 %    FEV1% 62 %    FEV1/FVC Ratio 55    Comments test completed 09/22/23      Post Bronchodilator Spirometry Results   FVC% 78 %    FEV1% 57 %    FEV1/FVC Ratio 53          Exercise Target Goals: Exercise Program Goal: Individual exercise prescription set using results from initial 6 min walk test and THRR while considering  patient's activity barriers and safety.   Exercise Prescription Goal: Initial exercise prescription builds to 30-45 minutes a day of aerobic activity, 2-3 days per week.  Home exercise guidelines will be given to patient during program as part of exercise prescription that the participant will acknowledge.  Activity Barriers & Risk Stratification:  Activity Barriers & Cardiac Risk Stratification - 10/14/23 1426       Activity Barriers & Cardiac Risk Stratification   Activity Barriers Arthritis;Back Problems;Balance Concerns;Shortness of Breath   Arthritis in both shoulders, Back pain due to a crushed disc.         6 Minute Walk:  6 Minute Walk     Row Name 10/14/23 1510 10/14/23 1511 02/25/24 1350     6 Minute Walk   Phase Initial -- Discharge   Distance 1200 feet -- 1330 feet   Walk Time 6 minutes -- 6 minutes   # of Rest Breaks 0 -- 0   MPH 2.27 -- 2.52   METS 2.6 -- 2.5   RPE 13 -- 13   Perceived Dyspnea  2 -- 2   VO2 Peak 9.1 -- 8.75   Symptoms  No -- No   Resting HR 73 bpm -- 70 bpm   Resting BP 130/80 -- 122/74   Resting Oxygen Saturation  92 % --  94 %   Exercise Oxygen Saturation  during 6 min walk 96 % -- 87 %   Max Ex. HR 150 bpm -- 113 bpm   Max Ex. BP 142/80 -- 138/78   2 Minute Post BP 132/80 -- 120/76     Interval HR   1 Minute HR 133 -- 99   2 Minute HR 140 -- 102   3 Minute HR 146 -- 107   4 Minute HR 140 -- 110   5 Minute HR 143 -- 110   6 Minute HR 150 -- 113   2 Minute Post HR 86 -- 74   Interval Heart Rate? Yes -- Yes     Interval Oxygen   Interval Oxygen? -- Yes Yes   Baseline Oxygen Saturation % -- 92 % 97 %   1 Minute Oxygen Saturation % -- 88 % 91 %   1 Minute Liters of Oxygen -- 0 L 0 L   2 Minute Oxygen Saturation % -- 86 % 90 %   2 Minute Liters of Oxygen -- 0 L 0 L   3 Minute Oxygen Saturation % -- 86 % 90 %   3 Minute Liters of Oxygen -- 0 L 0 L   4 Minute Oxygen Saturation % -- 86 % 91 %   4 Minute Liters of Oxygen -- 0 L 0 L   5 Minute Oxygen Saturation % -- 87 % 90 %   5 Minute Liters of Oxygen -- 0 L 0 L   6 Minute Oxygen Saturation % -- 87 % 87 %   6 Minute Liters of Oxygen -- 0 L 0 L   2 Minute Post Oxygen Saturation % -- 93 % 94 %   2 Minute Post Liters of Oxygen -- 0 L 0 L      Oxygen Initial Assessment:  Oxygen Initial Assessment - 10/14/23 1431       Home Oxygen   Home Oxygen Device None    Sleep Oxygen Prescription CPAP   At night   Home Exercise Oxygen Prescription None    Home Resting Oxygen Prescription None    Compliance with Home Oxygen Use Yes   Compliance with CPAP use         Oxygen Re-Evaluation:  Oxygen Re-Evaluation     Row Name 10/27/23 1423 12/08/23 1407 01/01/24 1944         Program Oxygen Prescription   Program Oxygen Prescription None None None       Home Oxygen   Home Oxygen Device None None None     Sleep Oxygen Prescription CPAP CPAP CPAP     Home Exercise Oxygen Prescription None None None     Home Resting Oxygen Prescription None None  None     Compliance with Home Oxygen Use Yes Yes Yes       Goals/Expected Outcomes   Short Term Goals To learn and understand importance of monitoring SPO2 with pulse oximeter and demonstrate accurate use of the pulse oximeter.;To learn and understand importance of maintaining oxygen saturations>88%;To learn and demonstrate proper pursed lip breathing techniques or other breathing techniques. ;To learn and demonstrate proper use of respiratory medications To learn and understand importance of monitoring SPO2 with pulse oximeter and demonstrate accurate use of the pulse oximeter.;To learn and understand importance of maintaining oxygen saturations>88%;To learn and demonstrate proper pursed lip breathing techniques or other breathing techniques. ;To learn and demonstrate proper use of respiratory medications To learn and understand importance of monitoring  SPO2 with pulse oximeter and demonstrate accurate use of the pulse oximeter.;To learn and understand importance of maintaining oxygen saturations>88%;To learn and demonstrate proper pursed lip breathing techniques or other breathing techniques. ;To learn and demonstrate proper use of respiratory medications     Long  Term Goals Verbalizes importance of monitoring SPO2 with pulse oximeter and return demonstration;Exhibits proper breathing techniques, such as pursed lip breathing or other method taught during program session;Demonstrates proper use of MDI's;Compliance with respiratory medication;Maintenance of O2 saturations>88% Verbalizes importance of monitoring SPO2 with pulse oximeter and return demonstration;Exhibits proper breathing techniques, such as pursed lip breathing or other method taught during program session;Demonstrates proper use of MDI's;Compliance with respiratory medication;Maintenance of O2 saturations>88% Verbalizes importance of monitoring SPO2 with pulse oximeter and return demonstration;Exhibits proper breathing techniques, such as  pursed lip breathing or other method taught during program session;Demonstrates proper use of MDI's;Compliance with respiratory medication;Maintenance of O2 saturations>88%     Comments Patient is taking his prescribed medications as he should. States breathing has gotten a little better since starting the program as well. Jammie states that his breathing kind of waxes and wanes. It is good for the most part but that it gets congested and harder to breathe when the weather changes. Kshawn is doing well in rehab.  His breathing has been getting a little better.  He is using his PLB.  He is compliant with his CPAP.     Goals/Expected Outcomes Short: Continue attending rehab and compliance with meds. Long: Increase exercise independance at home once program is finished. Short: Continue attending rehab and compliance with meds. Long: Increase exercise independance at home once program is finished. Short: COnitnue to monitor breathing Long; Conitnued compliance        Oxygen Discharge (Final Oxygen Re-Evaluation):  Oxygen Re-Evaluation - 01/01/24 1944       Program Oxygen Prescription   Program Oxygen Prescription None      Home Oxygen   Home Oxygen Device None    Sleep Oxygen Prescription CPAP    Home Exercise Oxygen Prescription None    Home Resting Oxygen Prescription None    Compliance with Home Oxygen Use Yes      Goals/Expected Outcomes   Short Term Goals To learn and understand importance of monitoring SPO2 with pulse oximeter and demonstrate accurate use of the pulse oximeter.;To learn and understand importance of maintaining oxygen saturations>88%;To learn and demonstrate proper pursed lip breathing techniques or other breathing techniques. ;To learn and demonstrate proper use of respiratory medications    Long  Term Goals Verbalizes importance of monitoring SPO2 with pulse oximeter and return demonstration;Exhibits proper breathing techniques, such as pursed lip breathing or other method  taught during program session;Demonstrates proper use of MDI's;Compliance with respiratory medication;Maintenance of O2 saturations>88%    Comments Inioluwa is doing well in rehab.  His breathing has been getting a little better.  He is using his PLB.  He is compliant with his CPAP.    Goals/Expected Outcomes Short: COnitnue to monitor breathing Long; Conitnued compliance          Initial Exercise Prescription:  Initial Exercise Prescription - 10/14/23 1500       Date of Initial Exercise RX and Referring Provider   Date 10/14/23    Referring Provider Darlean Sharper MD      Oxygen   Maintain Oxygen Saturation 88% or higher      Treadmill   MPH 1.5    Grade 0    Minutes 15  METs 1.9      REL-XR   Level 1    Speed 50    Minutes 15    METs 1.8      Prescription Details   Frequency (times per week) 2    Duration Progress to 30 minutes of continuous aerobic without signs/symptoms of physical distress      Intensity   THRR 40-80% of Max Heartrate 102/131    Ratings of Perceived Exertion 11-13    Perceived Dyspnea 0-4      Resistance Training   Training Prescription Yes    Weight 4    Reps 10-15          Perform Capillary Blood Glucose checks as needed.  Exercise Prescription Changes:   Exercise Prescription Changes     Row Name 10/14/23 1500 10/27/23 1500 11/10/23 1500 12/03/23 1500 12/22/23 1500     Response to Exercise   Blood Pressure (Admit) 130/80 128/80 152/58 128/72 118/70   Blood Pressure (Exercise) 142/80 130/80 156/70 -- --   Blood Pressure (Exit) 132/80 112/60 118/60 130/80 130/80   Heart Rate (Admit) 73 bpm 68 bpm 66 bpm 68 bpm 74 bpm   Heart Rate (Exercise) 150 bpm 108 bpm 110 bpm 97 bpm 113 bpm   Heart Rate (Exit) 86 bpm 87 bpm 78 bpm 86 bpm 92 bpm   Oxygen Saturation (Admit) 92 % 93 % 96 % 95 % 93 %   Oxygen Saturation (Exercise) 86 % 92 % 90 % 90 % 90 %   Oxygen Saturation (Exit) 93 % 94 % 93 % 94 % 92 %   Rating of Perceived Exertion  (Exercise) 13 16 13 14 14    Perceived Dyspnea (Exercise) 2 2 3 3 3    Duration Continue with 30 min of aerobic exercise without signs/symptoms of physical distress. Continue with 30 min of aerobic exercise without signs/symptoms of physical distress. Continue with 30 min of aerobic exercise without signs/symptoms of physical distress. Continue with 30 min of aerobic exercise without signs/symptoms of physical distress. Continue with 30 min of aerobic exercise without signs/symptoms of physical distress.   Intensity THRR unchanged THRR unchanged THRR unchanged THRR unchanged THRR unchanged     Progression   Progression Continue to progress workloads to maintain intensity without signs/symptoms of physical distress. Continue to progress workloads to maintain intensity without signs/symptoms of physical distress. Continue to progress workloads to maintain intensity without signs/symptoms of physical distress. Continue to progress workloads to maintain intensity without signs/symptoms of physical distress. Continue to progress workloads to maintain intensity without signs/symptoms of physical distress.     Resistance Training   Training Prescription -- Yes Yes Yes Yes   Weight -- 4 5 5 5    Reps -- 10-15 10-15 10-15 10-15     Treadmill   MPH -- 1.5 1.6 1.5 1.5   Grade -- 0.5 0.5 1 1    Minutes -- 15 15 15 15    METs -- 2.25 2.34 2.35 2.35     REL-XR   Level -- 2 2 2 2    Speed -- 46 55 56 43   Minutes -- 15 15 15 15    METs -- 3.4 4 3.1 2.6    Row Name 01/05/24 1500 01/19/24 1500           Response to Exercise   Blood Pressure (Admit) 120/72 112/62      Blood Pressure (Exit) 118/52 110/50      Heart Rate (Admit) 77 bpm 75 bpm  Heart Rate (Exercise) 95 bpm 111 bpm      Heart Rate (Exit) 82 bpm 90 bpm      Oxygen Saturation (Admit) 92 % 92 %      Oxygen Saturation (Exercise) 89 % 90 %      Oxygen Saturation (Exit) 94 % 94 %      Rating of Perceived Exertion (Exercise) 15 14       Perceived Dyspnea (Exercise) 2 2      Duration Continue with 30 min of aerobic exercise without signs/symptoms of physical distress. Continue with 30 min of aerobic exercise without signs/symptoms of physical distress.      Intensity THRR unchanged THRR unchanged        Progression   Progression Continue to progress workloads to maintain intensity without signs/symptoms of physical distress. Continue to progress workloads to maintain intensity without signs/symptoms of physical distress.        Resistance Training   Training Prescription Yes Yes      Weight 5 5      Reps 10-15 10-15        Treadmill   MPH 2 2.1      Grade 2 2      Minutes 15 15      METs 3.08 3.19        REL-XR   Level 2 2      Speed 51 66      Minutes 15 15      METs 3.3 4.3         Exercise Comments:   Exercise Comments     Row Name 10/15/23 1409           Exercise Comments First full day of exercise!  Patient was oriented to gym and equipment including functions, settings, policies, and procedures.  Patient's individual exercise prescription and treatment plan were reviewed.  All starting workloads were established based on the results of the 6 minute walk test done at initial orientation visit.  The plan for exercise progression was also introduced and progression will be customized based on patient's performance and goals.          Exercise Goals and Review:   Exercise Goals     Row Name 10/14/23 1514             Exercise Goals   Increase Physical Activity Yes       Intervention Provide advice, education, support and counseling about physical activity/exercise needs.;Develop an individualized exercise prescription for aerobic and resistive training based on initial evaluation findings, risk stratification, comorbidities and participant's personal goals.       Expected Outcomes Short Term: Attend rehab on a regular basis to increase amount of physical activity.;Long Term: Add in home exercise  to make exercise part of routine and to increase amount of physical activity.;Long Term: Exercising regularly at least 3-5 days a week.       Increase Strength and Stamina Yes       Intervention Provide advice, education, support and counseling about physical activity/exercise needs.;Develop an individualized exercise prescription for aerobic and resistive training based on initial evaluation findings, risk stratification, comorbidities and participant's personal goals.       Expected Outcomes Short Term: Increase workloads from initial exercise prescription for resistance, speed, and METs.;Short Term: Perform resistance training exercises routinely during rehab and add in resistance training at home;Long Term: Improve cardiorespiratory fitness, muscular endurance and strength as measured by increased METs and functional capacity ( )  Able to understand and use rate of perceived exertion (RPE) scale Yes       Intervention Provide education and explanation on how to use RPE scale       Expected Outcomes Short Term: Able to use RPE daily in rehab to express subjective intensity level;Long Term:  Able to use RPE to guide intensity level when exercising independently       Able to understand and use Dyspnea scale Yes       Intervention Provide education and explanation on how to use Dyspnea scale       Expected Outcomes Short Term: Able to use Dyspnea scale daily in rehab to express subjective sense of shortness of breath during exertion;Long Term: Able to use Dyspnea scale to guide intensity level when exercising independently       Knowledge and understanding of Target Heart Rate Range (THRR) Yes       Intervention Provide education and explanation of THRR including how the numbers were predicted and where they are located for reference       Expected Outcomes Short Term: Able to use daily as guideline for intensity in rehab;Short Term: Able to state/look up THRR;Long Term: Able to use THRR to  govern intensity when exercising independently       Able to check pulse independently Yes       Intervention Provide education and demonstration on how to check pulse in carotid and radial arteries.;Review the importance of being able to check your own pulse for safety during independent exercise       Expected Outcomes Short Term: Able to explain why pulse checking is important during independent exercise;Long Term: Able to check pulse independently and accurately       Understanding of Exercise Prescription Yes       Intervention Provide education, explanation, and written materials on patient's individual exercise prescription       Expected Outcomes Short Term: Able to explain program exercise prescription;Long Term: Able to explain home exercise prescription to exercise independently          Exercise Goals Re-Evaluation :  Exercise Goals Re-Evaluation     Row Name 10/15/23 1410 10/27/23 1421 10/28/23 0833 11/12/23 0912 12/08/23 1408     Exercise Goal Re-Evaluation   Exercise Goals Review -- Increase Physical Activity;Increase Strength and Stamina;Able to understand and use Dyspnea scale;Able to understand and use rate of perceived exertion (RPE) scale;Able to check pulse independently Increase Physical Activity;Increase Strength and Stamina;Understanding of Exercise Prescription Increase Physical Activity;Increase Strength and Stamina;Understanding of Exercise Prescription Increase Physical Activity;Increase Strength and Stamina;Able to understand and use rate of perceived exertion (RPE) scale;Able to understand and use Dyspnea scale;Able to check pulse independently;Knowledge and understanding of Target Heart Rate Range (THRR);Understanding of Exercise Prescription   Comments Reviewed RPE and dyspnea scale, THR and program prescription with pt today.  Pt voiced understanding and was given a copy of goals to take home. Zacariah says he doesn't do much exercise outside the program. He does  minimal walking outside of here, but not much. Abdallah is doing well in rehab and is on his 5th visit. He has increased his grade on the treadmill to 0.5 and has also increased his level on the XR to 2. WIll continue to montior and progress asable. Rohaan continues to do well in rehab, he is on his 9th visit. He has increads his walking speed to 1.6 on the treadmill with a 0.5 grade and is completing the whole 15 min  on the treadmill. Will continue to monitor and progress as able. Jomel states he does a little bit of walking at home, yet most of his exercise comes from here at the program.   Expected Outcomes Short: Use RPE daily to regulate intensity.  Long: Follow program prescription in THR. Short: Continue to attend rehab. Long term: Start exercising at home. Continue to attend rehab Continue to attend rehab Short: Continue to attend rehab. Long: Incorporate more home exercise into his routine.    Row Name 01/01/24 1110 02/23/24 1457           Exercise Goal Re-Evaluation   Exercise Goals Review Increase Physical Activity;Increase Strength and Stamina;Understanding of Exercise Prescription Increase Physical Activity;Increase Strength and Stamina;Understanding of Exercise Prescription      Comments Dalten is doing well overall in rehab. He is walking at home on his off days and staying active at home.  He does note that his stamina has gotten better. Nicholaus is doing great in rehab. He is getting close to graduating from the program. He has noticed an increase in his endurancr since starting the program. He is increasing his level on the XR and walking speed on the treadmill. When he was on vaction he stated that he was able to walk around more then he use to. He is walking at home in the mornings when the heat is not has bad,      Expected Outcomes Short:Continue to walk more at home  Long: Contiue to improve stamina Short:Continue to walk more at home  Long: Contiue to improve stamina         Discharge  Exercise Prescription (Final Exercise Prescription Changes):  Exercise Prescription Changes - 01/19/24 1500       Response to Exercise   Blood Pressure (Admit) 112/62    Blood Pressure (Exit) 110/50    Heart Rate (Admit) 75 bpm    Heart Rate (Exercise) 111 bpm    Heart Rate (Exit) 90 bpm    Oxygen Saturation (Admit) 92 %    Oxygen Saturation (Exercise) 90 %    Oxygen Saturation (Exit) 94 %    Rating of Perceived Exertion (Exercise) 14    Perceived Dyspnea (Exercise) 2    Duration Continue with 30 min of aerobic exercise without signs/symptoms of physical distress.    Intensity THRR unchanged      Progression   Progression Continue to progress workloads to maintain intensity without signs/symptoms of physical distress.      Resistance Training   Training Prescription Yes    Weight 5    Reps 10-15      Treadmill   MPH 2.1    Grade 2    Minutes 15    METs 3.19      REL-XR   Level 2    Speed 66    Minutes 15    METs 4.3          Nutrition:  Target Goals: Understanding of nutrition guidelines, daily intake of sodium 1500mg , cholesterol 200mg , calories 30% from fat and 7% or less from saturated fats, daily to have 5 or more servings of fruits and vegetables.  Biometrics:  Pre Biometrics - 10/14/23 1515       Pre Biometrics   Height 5' 7 (1.702 m)    Weight 233 lb 0.4 oz (105.7 kg)    Waist Circumference 50 inches    Hip Circumference 41 inches    Waist to Hip Ratio 1.22 %  BMI (Calculated) 36.49    Grip Strength 26.7 kg    Single Leg Stand 4 seconds          Post Biometrics - 02/25/24 1352        Post  Biometrics   Height 5' 7 (1.702 m)    Weight 221 lb 12.5 oz (100.6 kg)    Waist Circumference 46 inches    Hip Circumference 47 inches    Waist to Hip Ratio 0.98 %    BMI (Calculated) 34.73    Grip Strength 31.4 kg    Single Leg Stand 6 seconds          Nutrition Therapy Plan and Nutrition Goals:   Nutrition Assessments:  Nutrition  Assessments - 10/14/23 1517       Rate Your Plate Scores   Pre Score 0.89         MEDIFICTS Score Key: >=70 Need to make dietary changes  40-70 Heart Healthy Diet <= 40 Therapeutic Level Cholesterol Diet  Flowsheet Row PULMONARY REHAB CHRONIC OBSTRUCTIVE PULMONARY DISEASE from 02/25/2024 in [facility]  Picture Your Plate Total Score on Discharge 80   Picture Your Plate Scores: <59 Unhealthy dietary pattern with much room for improvement. 41-50 Dietary pattern unlikely to meet recommendations for good health and room for improvement. 51-60 More healthful dietary pattern, with some room for improvement.  >60 Healthy dietary pattern, although there may be some specific behaviors that could be improved.    Nutrition Goals Re-Evaluation:  Nutrition Goals Re-Evaluation     Row Name 10/27/23 1416 12/08/23 1404 01/01/24 1941 02/23/24 1504       Goals   Current Weight -- 232 lb (105.2 kg) -- --    Nutrition Goal Continue to attend rehab and eat healthy. Continue to attend rehab and eat healthy. Short: Continue to eat healthy. Long: Him and his spouse achieve some weight loss. Healthy eating    Comment Deago says him and his partner overall try to eat a healthy diet. They did have a cheat weekend at the [facility], but they split the cheesecake. Christohper states him and his spouse have had a complete change of how they eat. His spouse enrolled in [place] [facility], so they are teaching them portion control. They also fast for 16 hours as part of their diet. Casimiro has been doing well with his diet.  He is eating better than when he started.  He is working with his wide to work on portion control.  He is eating mainly 1 main meal a day. Elige has been doing well on his diet. He is trying to stick to helathy foods and portion control. He did have a cheat weekend with smoked meat and fish due to the holiday. He does not eat many sweets and does not really like  salty things. He will eat unsalted nuts as a snack. He drinks a lot of water and will have one diet soda every other week.    Expected Outcome Short: Eat healthy. Long term: Lose some weight and work on portion control. Short: Continue to eat healthy. Long: Him and his spouse achieve some weight loss. Short; continue to work on portion control Long: Continue to follow healthy eating Short; continue to work on portion control Long: Continue to follow healthy eating       Nutrition Goals Discharge (Final Nutrition Goals Re-Evaluation):  Nutrition Goals Re-Evaluation - 02/23/24 1504       Goals   Nutrition  Goal Healthy eating    Comment Alvester has been doing well on his diet. He is trying to stick to helathy foods and portion control. He did have a cheat weekend with smoked meat and fish due to the holiday. He does not eat many sweets and does not really like salty things. He will eat unsalted nuts as a snack. He drinks a lot of water and will have one diet soda every other week.    Expected Outcome Short; continue to work on portion control Long: Continue to follow healthy eating          Psychosocial: Target Goals: Acknowledge presence or absence of significant depression and/or stress, maximize coping skills, provide positive support system. Participant is able to verbalize types and ability to use techniques and skills needed for reducing stress and depression.  Initial Review & Psychosocial Screening:  Initial Psych Review & Screening - 10/14/23 1432       Initial Review   Current issues with Current Sleep Concerns   Wakes up once every night- falls back asleep within the hour.     Family Dynamics   Good Support System? Yes    Comments Husband Grayce is support system.      Barriers   Psychosocial barriers to participate in program There are no identifiable barriers or psychosocial needs.;The patient should benefit from training in stress management and relaxation.      Screening  Interventions   Interventions Encouraged to exercise;Provide feedback about the scores to participant;To provide support and resources with identified psychosocial needs    Expected Outcomes Short Term goal: Utilizing psychosocial counselor, staff and physician to assist with identification of specific Stressors or current issues interfering with healing process. Setting desired goal for each stressor or current issue identified.;Long Term Goal: Stressors or current issues are controlled or eliminated.;Short Term goal: Identification and review with participant of any Quality of Life or Depression concerns found by scoring the questionnaire.;Long Term goal: The participant improves quality of Life and PHQ9 Scores as seen by post scores and/or verbalization of changes          Quality of Life Scores:  Scores of 19 and below usually indicate a poorer quality of life in these areas.  A difference of  2-3 points is a clinically meaningful difference.  A difference of 2-3 points in the total score of the Quality of Life Index has been associated with significant improvement in overall quality of life, self-image, physical symptoms, and general health in studies assessing change in quality of life.   PHQ-9: Review Flowsheet  More data exists      02/25/2024 10/12/2023 06/19/2023 06/09/2023 05/13/2023  Depression screen PHQ 2/9  Decreased Interest 0 0 0 0 0  Down, Depressed, Hopeless 0 0 0 0 0  PHQ - 2 Score 0 0 0 0 0  Altered sleeping 0 1 0 0 -  Tired, decreased energy 0 - 0 0 -  Change in appetite 0 0 0 0 -  Feeling bad or failure about yourself  0 0 0 0 -  Trouble concentrating 1 0 0 0 -  Moving slowly or fidgety/restless 0 0 0 0 -  Suicidal thoughts 0 0 0 0 -  PHQ-9 Score 1 1 0 0 -  Difficult doing work/chores Not difficult at all Not difficult at all Not difficult at all Not difficult at all -   Interpretation of Total Score  Total Score Depression Severity:  1-4 = Minimal depression, 5-9  =  Mild depression, 10-14 = Moderate depression, 15-19 = Moderately severe depression, 20-27 = Severe depression   Psychosocial Evaluation and Intervention:  Psychosocial Evaluation - 10/14/23 1524       Psychosocial Evaluation & Interventions   Interventions Encouraged to exercise with the program and follow exercise prescription;Relaxation education;Stress management education    Comments Byren was referred to this program for his diagnosis of COPD. Currently he gets short of breath with minimal activities, yet he wants to improve this to not be as short of breath as often. 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 He occasionally will get dizzy first thing in the mornings, but it resolves shortly after sitting on the side of the bed. He denies any stressors in his life, his support system consists of his signifcant other, Robin. Shoji is wanting to regain his strength and stamina and not be so short of breath as a result of this program. No barries identified to complete the program.    Continue Psychosocial Services  Follow up required by staff          Psychosocial Re-Evaluation:  Psychosocial Re-Evaluation     Row Name 10/27/23 1414 12/08/23 1402 01/01/24 1125 02/23/24 1500       Psychosocial Re-Evaluation   Current issues with None Identified None Identified None Identified Current Stress Concerns    Comments Curt states that his stress level is at a good level, no current life stressors. He says he is sleeping well and his support system is great. Bralin states that he currently doesn't have any stress and that he is sleeping well. He has a great support system which is his spouse. Wendy is doing well in rehab.  He is feeling good mentally and enjoys coming to class. He is still sleeping well. Jakobe is doing well in rehab. He is feeling good overall. He is sleeping well. He only has sleeping issues when he gets  nasel congestion due to sleeping with a CPAP and not able to breath through his nose . He stated that he does have some stress due to being in the process of buying a house in [place]. They are talking about moving there full time instead of having it as a vaction spot. This is causing him some stress due to the unknown but he seems excited about having a place there.    Expected Outcomes Short: Continue to attend rehab. Long: Continue to have as few stressors in life as possible and continue sleeping well. Short: Continue to attend rehab. Long: Continue the stress relief techniques that are working in his life. Short: Continue to exercise for mental boost Long: Continue to stay positive Short: Continue to exercise for mental boost Long: Continue to stay positive    Interventions Encouraged to attend Pulmonary Rehabilitation for the exercise Encouraged to attend Pulmonary Rehabilitation for the exercise Encouraged to attend Pulmonary Rehabilitation for the exercise Encouraged to attend Pulmonary Rehabilitation for the exercise    Continue Psychosocial Services  Follow up required by staff Follow up required by staff Follow up required by staff Follow up required by staff       Psychosocial Discharge (Final Psychosocial Re-Evaluation):  Psychosocial Re-Evaluation - 02/23/24 1500       Psychosocial Re-Evaluation   Current issues with Current Stress Concerns    Comments Dailyn is doing well in rehab. He is feeling good overall. He is sleeping well. He only  has sleeping issues when he gets nasel congestion due to sleeping with a CPAP and not able to breath through his nose . He stated that he does have some stress due to being in the process of buying a house in [place]. They are talking about moving there full time instead of having it as a vaction spot. This is causing him some stress due to the unknown but he seems excited about having a place there.    Expected Outcomes Short: Continue to exercise for  mental boost Long: Continue to stay positive    Interventions Encouraged to attend Pulmonary Rehabilitation for the exercise    Continue Psychosocial Services  Follow up required by staff           Education: Education Goals: Education classes will be provided on a weekly basis, covering required topics. Participant will state understanding/return demonstration of topics presented.  Learning Barriers/Preferences:  Learning Barriers/Preferences - 10/14/23 1435       Learning Barriers/Preferences   Learning Barriers None    Learning Preferences Written Material;Video;Individual Instruction;Group Instruction          Education Topics: How Lungs Work and Diseases: - Discuss the anatomy of the lungs and diseases that can affect the lungs, such as COPD. Flowsheet Row PULMONARY REHAB CHRONIC OBSTRUCTIVE PULMONARY DISEASE from 02/11/2024 in [place] [facility]  Date 11/19/23  Educator Hb  Instruction Review Code 1- Verbalizes Understanding    Exercise: -Discuss the importance of exercise, FITT principles of exercise, normal and abnormal responses to exercise, and how to exercise safely. Flowsheet Row PULMONARY REHAB CHRONIC OBSTRUCTIVE PULMONARY DISEASE from 02/11/2024 in [place] [facility]  Date 02/11/24  Educator HB  Instruction Review Code 1- Verbalizes Understanding    Environmental Irritants: -Discuss types of environmental irritants and how to limit exposure to environmental irritants.   Meds/Inhalers and oxygen: - Discuss respiratory medications, definition of an inhaler and oxygen, and the proper way to use an inhaler and oxygen.   Energy Saving Techniques: - Discuss methods to conserve energy and decrease shortness of breath when performing activities of daily living.    Bronchial Hygiene / Breathing Techniques: - Discuss breathing mechanics, pursed-lip breathing technique,  proper posture, effective ways to clear airways, and other  functional breathing techniques   Cleaning Equipment: - Provides group verbal and written instruction about the health risks of elevated stress, cause of high stress, and healthy ways to reduce stress.   Nutrition I: Fats: - Discuss the types of cholesterol, what cholesterol does to the body, and how cholesterol levels can be controlled. Flowsheet Row PULMONARY REHAB CHRONIC OBSTRUCTIVE PULMONARY DISEASE from 02/11/2024 in [place] [facility]  Date 10/22/23  Educator [facility]  Instruction Review Code 1- Verbalizes Understanding    Nutrition II: Labels: -Discuss the different components of food labels and how to read food labels. Flowsheet Row PULMONARY REHAB CHRONIC OBSTRUCTIVE PULMONARY DISEASE from 02/11/2024 in [place] [facility]  Date 12/24/23  Educator HB  Instruction Review Code 1- Verbalizes Understanding    Respiratory Infections: - Discuss the signs and symptoms of respiratory infections, ways to prevent respiratory infections, and the importance of seeking medical treatment when having a respiratory infection.   Stress I: Signs and Symptoms: - Discuss the causes of stress, how stress may lead to anxiety and depression, and ways to limit stress. Flowsheet Row PULMONARY REHAB CHRONIC OBSTRUCTIVE PULMONARY DISEASE from 02/11/2024 in [place] [facility]  Date 10/29/23  Educator DJ  Instruction Review Code  1- Verbalizes Understanding    Stress II: Relaxation: -Discuss relaxation techniques to limit stress. Flowsheet Row PULMONARY REHAB CHRONIC OBSTRUCTIVE PULMONARY DISEASE from 02/11/2024 in [place] [facility]  Date 10/15/23  Educator [facility]  Instruction Review Code 1- Verbalizes Understanding    Oxygen for Home/Travel: - Discuss how to prepare for travel when on oxygen and proper ways to transport and store oxygen to ensure safety.   Knowledge Questionnaire Score:  Knowledge Questionnaire Score - 02/25/24 1401        Knowledge Questionnaire Score   Post Score 16/18          Core Components/Risk Factors/Patient Goals at Admission:  Personal Goals and Risk Factors at Admission - 10/14/23 1519       Core Components/Risk Factors/Patient Goals on Admission    Weight Management Yes    Intervention Weight Management: Develop a combined nutrition and exercise program designed to reach desired caloric intake, while maintaining appropriate intake of nutrient and fiber, sodium and fats, and appropriate energy expenditure required for the weight goal.;Weight Management: Provide education and appropriate resources to help participant work on and attain dietary goals.;Weight Management/Obesity: Establish reasonable short term and long term weight goals.    Admit Weight 233 lb 0.4 oz (105.7 kg)    Improve shortness of breath with ADL's Yes    Intervention Provide education, individualized exercise plan and daily activity instruction to help decrease symptoms of SOB with activities of daily living.    Expected Outcomes Short Term: Improve cardiorespiratory fitness to achieve a reduction of symptoms when performing ADLs    Increase knowledge of respiratory medications and ability to use respiratory devices properly  Yes    Intervention Provide education and demonstration as needed of appropriate use of medications, inhalers, and oxygen therapy.    Expected Outcomes Short Term: Achieves understanding of medications use. Understands that oxygen is a medication prescribed by physician. Demonstrates appropriate use of inhaler and oxygen therapy.;Long Term: Maintain appropriate use of medications, inhalers, and oxygen therapy.    Diabetes Yes    Intervention Provide education about signs/symptoms and action to take for hypo/hyperglycemia.;Provide education about proper nutrition, including hydration, and aerobic/resistive exercise prescription along with prescribed medications to achieve blood glucose in normal ranges: Fasting  glucose 65-99 mg/dL    Expected Outcomes Short Term: Participant verbalizes understanding of the signs/symptoms and immediate care of hyper/hypoglycemia, proper foot care and importance of medication, aerobic/resistive exercise and nutrition plan for blood glucose control.;Long Term: Attainment of HbA1C < 7%.    Hypertension Yes    Intervention Monitor prescription use compliance.;Provide education on lifestyle modifcations including regular physical activity/exercise, weight management, moderate sodium restriction and increased consumption of fresh fruit, vegetables, and low fat dairy, alcohol moderation, and smoking cessation.    Expected Outcomes Long Term: Maintenance of blood pressure at goal levels.;Short Term: Continued assessment and intervention until BP is < 140/20mm HG in hypertensive participants. < 130/16mm HG in hypertensive participants with diabetes, heart failure or chronic kidney disease.    Lipids Yes    Intervention Provide education and support for participant on nutrition & aerobic/resistive exercise along with prescribed medications to achieve LDL 70mg , HDL >40mg .    Expected Outcomes Short Term: Participant states understanding of desired cholesterol values and is compliant with medications prescribed. Participant is following exercise prescription and nutrition guidelines.;Long Term: Cholesterol controlled with medications as prescribed, with individualized exercise RX and with personalized nutrition plan. Value goals: LDL < 70mg , HDL > 40 mg.  Core Components/Risk Factors/Patient Goals Review:   Goals and Risk Factor Review     Row Name 10/27/23 1418 12/08/23 1406 01/01/24 1943 02/23/24 1508       Core Components/Risk Factors/Patient Goals Review   Personal Goals Review Weight Management/Obesity;Increase knowledge of respiratory medications and ability to use respiratory devices properly.;Lipids Weight Management/Obesity;Increase knowledge of respiratory  medications and ability to use respiratory devices properly.;Lipids;Hypertension Weight Management/Obesity;Increase knowledge of respiratory medications and ability to use respiratory devices properly.;Lipids;Hypertension;Improve shortness of breath with ADL's Weight Management/Obesity;Increase knowledge of respiratory medications and ability to use respiratory devices properly.;Lipids;Hypertension;Improve shortness of breath with ADL's    Review Momodou said his breathing has been a little better since attending the program. He does not have a pulse oximeter to check his sat's at home but he is planning on buying one in the near future. His BP runs stable like it does here. He is taking his medications like he is supposed to. Martavion states he is taking his medications as he should at home. He states he checks his pulse ox several times a day and it is in the mid 90's. Naquan is doing well in rehab.  He is starting to lose weight.  His pressures are doing well.  His breathing has gotten a little better.  His meds are doing welll. He wants to continue to improve Roel is doing well in rehab. He is continuing to eat healthy and watching his weight. He has noticed that his breathing is better when the weather is not so hot. He has his good days and bad days. When he was in [place] he noticed that sicne the weather was cooler with  the constant wind that he was able to breath better and noticed that his endurace was better.    Expected Outcomes Short term: Continue to attend rehab. Long term: Breathing continue to gets better. Short term: Continue to attend rehab. Long term: Breathing continue to gets better. Short: Continue to work on weight loss lOng: Contineu to monitor breathing Short: continue to focuse on breathing  long: continue to pick healthy options for weight managment       Core Components/Risk Factors/Patient Goals at Discharge (Final Review):   Goals and Risk Factor Review - 02/23/24 1508       Core  Components/Risk Factors/Patient Goals Review   Personal Goals Review Weight Management/Obesity;Increase knowledge of respiratory medications and ability to use respiratory devices properly.;Lipids;Hypertension;Improve shortness of breath with ADL's    Review Shaan is doing well in rehab. He is continuing to eat healthy and watching his weight. He has noticed that his breathing is better when the weather is not so hot. He has his good days and bad days. When he was in [place] he noticed that sicne the weather was cooler with  the constant wind that he was able to breath better and noticed that his endurace was better.    Expected Outcomes Short: continue to focuse on breathing  long: continue to pick healthy options for weight managment          ITP Comments:  ITP Comments     Row Name 10/14/23 1533 10/15/23 1409 11/11/23 1145 12/09/23 1446 01/06/24 1130   ITP Comments Patient attend orientation today.  Patient is attending Pulmonary Rehabilitation Program.  Documentation for diagnosis can be found in [facility]/EPIC.  Reviewed medical chart, RPE/RPD, gym safety, and program guidelines.  Patient was fitted to equipment they will be using during rehab.  Patient is scheduled to start  exercise on 10/15/23.   Initial ITP created and sent for review and signature by Dr. Anton Kelp, Medical Director for Pulmonary Rehabilitation Program. First full day of exercise!  Patient was oriented to gym and equipment including functions, settings, policies, and procedures.  Patient's individual exercise prescription and treatment plan were reviewed.  All starting workloads were established based on the results of the 6 minute walk test done at initial orientation visit.  The plan for exercise progression was also introduced and progression will be customized based on patient's performance and goals. 30 day review completed. ITP sent to Dr.Jehanzeb Memon, Medical Director of  Pulmonary Rehab. Continue with ITP unless changes  are made by physician. 30 day review completed. ITP sent to Dr.Jehanzeb Memon, Medical Director of  Pulmonary Rehab. Continue with ITP unless changes are made by physician. 30 day review completed. ITP sent to Dr.Jehanzeb Memon, Medical Director of  Pulmonary Rehab. Continue with ITP unless changes are made by physician.    Row Name 02/03/24 1027 03/02/24 0941         ITP Comments 30 day review completed. ITP sent to Dr.Jehanzeb Memon, Medical Director of  Pulmonary Rehab. Continue with ITP unless changes are made by physician. Patient has missed 2 weeks on vacation. Not able to assess goals this review period. 30 day review completed. ITP sent to Dr.Jehanzeb Memon, Medical Director of  Pulmonary Rehab. Continue with ITP unless changes are made by physician.         Comments: 30 day review

## 2024-03-03 ENCOUNTER — Encounter (HOSPITAL_COMMUNITY)
Admission: RE | Admit: 2024-03-03 | Discharge: 2024-03-03 | Disposition: A | Discharge status: Home / Self Care | Source: Ambulatory Visit | Attending: Internal Medicine | Admitting: Internal Medicine

## 2024-03-03 DIAGNOSIS — J449 Chronic obstructive pulmonary disease, unspecified: Secondary | ICD-10-CM

## 2024-03-03 NOTE — Progress Notes (Signed)
 Daily Session Note  Patient Details  Name: Jerome Sanchez MRN: [id number] Date of Birth: 1951/03/17 Referring Provider:   Flowsheet Row PULMONARY REHAB COPD ORIENTATION from 10/14/2023 in [facility]  Referring Provider Darlean Sharper MD    Encounter Date: 03/03/2024  Check In:  Session Check In - 03/03/24 1356       Check-In   Supervising physician immediately available to respond to emergencies See telemetry face sheet for immediately available MD    Location [facility]    Staff Present Rolland Sake BSN, RN;Heather Con, [place], Exercise Physiologist    Virtual Visit No    Medication changes reported     No    Fall or balance concerns reported    No    Tobacco Cessation No Change    Warm-up and Cool-down Performed on first and last piece of equipment    Resistance Training Performed Yes    VAD Patient? No    PAD/SET Patient? No      Pain Assessment   Currently in Pain? No/denies    Multiple Pain Sites No          Capillary Blood Glucose: No results found for this or any previous visit (from the past 24 hours).    Social History   Tobacco Use  Smoking Status Former   Current packs/day: 0.00   Average packs/day: 1.3 packs/day for 51.0 years (63.8 ttl pk-yrs)   Types: Cigarettes   Start date: 56   Quit date: 2018   Years since quitting: 7.5  Smokeless Tobacco Never    Goals Met:  Proper associated with RPD/PD & O2 Sat Independence with exercise equipment Using PLB without cueing & demonstrates good technique Exercise tolerated well Queuing for purse lip breathing No report of concerns or symptoms today Strength training completed today  Goals Unmet:  Not Applicable  Comments: .Pt able to follow exercise prescription today without complaint.  Will continue to monitor for progression.

## 2024-03-08 ENCOUNTER — Encounter (HOSPITAL_COMMUNITY)
Admission: RE | Admit: 2024-03-08 | Discharge: 2024-03-08 | Discharge status: Home / Self Care | Source: Ambulatory Visit | Attending: Internal Medicine | Admitting: Internal Medicine

## 2024-03-08 DIAGNOSIS — J449 Chronic obstructive pulmonary disease, unspecified: Secondary | ICD-10-CM

## 2024-03-08 NOTE — Progress Notes (Signed)
 Daily Session Note  Patient Details  Name: Jerome Sanchez MRN: [id number] Date of Birth: [birth year] Referring Provider:   Flowsheet Row PULMONARY REHAB COPD ORIENTATION from 10/14/2023 in [facility] CARDIAC REHABILITATION  Referring Provider Jerome Sharper MD    Encounter Date: 03/08/2024  Check In:  Session Check In - 03/08/24 1353       Check-In   Supervising physician immediately available to respond to emergencies See telemetry face sheet for immediately available MD    Location [facility]    Staff Present Jerome Sanchez, BS, Exercise Physiologist;Jerome Billingsley, RN;Jerome Sanchez, BSN, RN, WTA-C    Virtual Visit No    Medication changes reported     No    Fall or balance concerns reported    No    Warm-up and Cool-down Performed on first and last piece of equipment    Resistance Training Performed Yes    VAD Patient? No    PAD/SET Patient? No      Pain Assessment   Currently in Pain? No/denies          Capillary Blood Glucose: No results found for this or any previous visit (from the past 24 hours).    Social History   Tobacco Use  Smoking Status Former   Current packs/day: 0.00   Average packs/day: 1.3 packs/day for 51.0 years (63.8 ttl pk-yrs)   Types: Cigarettes   Start date: 24   Quit date: 2018   Years since quitting: 7.5  Smokeless Tobacco Never    Goals Met:  Proper associated with RPD/PD & O2 Sat Independence with exercise equipment Using PLB without cueing & demonstrates good technique Exercise tolerated well Personal goals reviewed No report of concerns or symptoms today Strength training completed today  Goals Unmet:  Not Applicable  Comments:  Jerome Sanchez graduated today from  rehab with 36 sessions completed.  Details of the patient's exercise prescription and what He needs to do in order to continue the prescription and progress were discussed with patient.  Patient was given a copy of prescription and goals.   Patient verbalized understanding. Jerome Sanchez plans to continue to exercise by walking at home.

## 2024-03-08 NOTE — Progress Notes (Signed)
 Pulmonary Individual Treatment Plan  Patient Details  Name: Jerome Sanchez MRN: [id number] Date of Birth: 06-Jul-1951 Referring Provider:   Flowsheet Row PULMONARY REHAB COPD ORIENTATION from 10/14/2023 in [facility] CARDIAC RE[facility]ATION  Referring Provider Darlean Sharper MD    Initial Encounter Date:  Flowsheet Row PULMONARY REHAB COPD ORIENTATION from 10/14/2023 in [facility]  Date 10/14/23    Visit Diagnosis: Chronic obstructive pulmonary disease, unspecified COPD type (HCC)  Patient's Home Medications on Admission:   Current Outpatient Medications:    albuterol  (VENTOLIN  HFA) 108 (90 Base) MCG/ACT inhaler, INHALE 1 TO 2 PUFFS BY MOUTH EVERY 6 HOURS AS NEEDED FOR WHEEZING FOR SHORTNESS OF BREATH, Disp: 9 g, Rfl: 0   atorvastatin  (LIPITOR) 80 MG tablet, Take 1 tablet (80 mg total) by mouth daily., Disp: 100 tablet, Rfl: 2   dapagliflozin propanediol (FARXIGA) 5 MG TABS tablet, Take 5 mg by mouth daily., Disp: , Rfl:    ezetimibe  (ZETIA ) 10 MG tablet, Take 1 tablet (10 mg total) by mouth daily., Disp: 100 tablet, Rfl: 1   Fluticasone-Umeclidin-Vilant (TRELEGY ELLIPTA ) 100-62.5-25 MCG/ACT AEPB, INHALE 1 PUFF INTO LUNGS ONCE DAILY, Disp: 180 each, Rfl: 6   furosemide  (LASIX ) 40 MG tablet, Take 1 tablet by mouth once daily, Disp: 90 tablet, Rfl: 0   lansoprazole  (PREVACID ) 15 MG capsule, Take 1 capsule (15 mg total) by mouth as needed., Disp: 100 capsule, Rfl: 1   OVER THE COUNTER MEDICATION, Areds ( omega 3, lutein, zeaxanthin) take one a day, Disp: , Rfl:    valsartan  (DIOVAN ) 80 MG tablet, Take 1 tablet (80 mg total) by mouth daily., Disp: 100 tablet, Rfl: 3  Past Medical History: Past Medical History:  Diagnosis Date   Allergy    Arthritis    Asthma    Cardiac arrhythmia    Chronic kidney disease    COPD (chronic obstructive pulmonary disease) (HCC)    Essential hypertension, benign    GERD (gastroesophageal reflux disease)    Impaired glucose tolerance     Injury of right rotator cuff    Lumbar disc disease    Mixed hyperlipidemia    OSA (obstructive sleep apnea)    Sleep apnea    Type 2 diabetes mellitus (HCC)    Vitamin D deficiency     Tobacco Use: Social History   Tobacco Use  Smoking Status Former   Current packs/day: 0.00   Average packs/day: 1.3 packs/day for 51.0 years (63.8 ttl pk-yrs)   Types: Cigarettes   Start date: 46   Quit date: 2018   Years since quitting: 7.5  Smokeless Tobacco Never    Labs: Review Flowsheet  More data exists      Latest Ref Rng & Units 08/22/2021 03/05/2022 07/15/2022 06/22/2023 10/02/2023  Labs for ITP Cardiac and Pulmonary Rehab  Cholestrol 100 - 199 mg/dL 780  789  816  841  829   LDL (calc) 0 - 99 mg/dL 882  886  74  67  71   HDL-C >39 mg/dL 79  84  94  79  85   Trlycerides 0 - 149 mg/dL 867  76  81  62  72   Hemoglobin A1c 4.8 - 5.6 % 6.1  6.0  - 6.6  6.7     Capillary Blood Glucose: No results found for: GLUCAP   Pulmonary Assessment Scores:  Pulmonary Assessment Scores     Row Name 10/14/23 1522 02/25/24 1401       ADL UCSD  ADL Phase Entry Exit    SOB Score total 33 40    Rest 0 0    Walk 2 0    Stairs 3 2    Bath 1 1    Dress 0 1    Shop 1 2      CAT Score   CAT Score 14 15      mMRC Score   mMRC Score -- 2      UCSD: Self-administered rating of dyspnea associated with activities of daily living (ADLs) 6-point scale (0 = not at all to 5 = maximal or unable to do because of breathlessness)  Scoring Scores range from 0 to 120.  Minimally important difference is 5 units  CAT: CAT can identify the health impairment of COPD patients and is better correlated with disease progression.  CAT has a scoring range of zero to 40. The CAT score is classified into four groups of low (less than 10), medium (10 - 20), high (21-30) and very high (31-40) based on the impact level of disease on health status. A CAT score over 10 suggests significant symptoms.  A  worsening CAT score could be explained by an exacerbation, poor medication adherence, poor inhaler technique, or progression of COPD or comorbid conditions.  CAT MCID is 2 points  mMRC: mMRC (Modified Medical Research Council) Dyspnea Scale is used to assess the degree of baseline functional disability in patients of respiratory disease due to dyspnea. No minimal important difference is established. A decrease in score of 1 point or greater is considered a positive change.   Pulmonary Function Assessment:  Pulmonary Function Assessment - 01/27/24 1005       Initial Spirometry Results   FVC% 81 %    FEV1% 62 %    FEV1/FVC Ratio 55    Comments test completed 09/22/23      Post Bronchodilator Spirometry Results   FVC% 78 %    FEV1% 57 %    FEV1/FVC Ratio 53          Exercise Target Goals: Exercise Program Goal: Individual exercise prescription set using results from initial 6 min walk test and THRR while considering  patient's activity barriers and safety.   Exercise Prescription Goal: Initial exercise prescription builds to 30-45 minutes a day of aerobic activity, 2-3 days per week.  Home exercise guidelines will be given to patient during program as part of exercise prescription that the participant will acknowledge.  Activity Barriers & Risk Stratification:  Activity Barriers & Cardiac Risk Stratification - 10/14/23 1426       Activity Barriers & Cardiac Risk Stratification   Activity Barriers Arthritis;Back Problems;Balance Concerns;Shortness of Breath   Arthritis in both shoulders, Back pain due to a crushed disc.         6 Minute Walk:  6 Minute Walk     Row Name 10/14/23 1510 10/14/23 1511 02/25/24 1350     6 Minute Walk   Phase Initial -- Discharge   Distance 1200 feet -- 1330 feet   Walk Time 6 minutes -- 6 minutes   # of Rest Breaks 0 -- 0   MPH 2.27 -- 2.52   METS 2.6 -- 2.5   RPE 13 -- 13   Perceived Dyspnea  2 -- 2   VO2 Peak 9.1 -- 8.75   Symptoms  No -- No   Resting HR 73 bpm -- 70 bpm   Resting BP 130/80 -- 122/74   Resting Oxygen Saturation  92 % --  94 %   Exercise Oxygen Saturation  during 6 min walk 96 % -- 87 %   Max Ex. HR 150 bpm -- 113 bpm   Max Ex. BP 142/80 -- 138/78   2 Minute Post BP 132/80 -- 120/76     Interval HR   1 Minute HR 133 -- 99   2 Minute HR 140 -- 102   3 Minute HR 146 -- 107   4 Minute HR 140 -- 110   5 Minute HR 143 -- 110   6 Minute HR 150 -- 113   2 Minute Post HR 86 -- 74   Interval Heart Rate? Yes -- Yes     Interval Oxygen   Interval Oxygen? -- Yes Yes   Baseline Oxygen Saturation % -- 92 % 97 %   1 Minute Oxygen Saturation % -- 88 % 91 %   1 Minute Liters of Oxygen -- 0 L 0 L   2 Minute Oxygen Saturation % -- 86 % 90 %   2 Minute Liters of Oxygen -- 0 L 0 L   3 Minute Oxygen Saturation % -- 86 % 90 %   3 Minute Liters of Oxygen -- 0 L 0 L   4 Minute Oxygen Saturation % -- 86 % 91 %   4 Minute Liters of Oxygen -- 0 L 0 L   5 Minute Oxygen Saturation % -- 87 % 90 %   5 Minute Liters of Oxygen -- 0 L 0 L   6 Minute Oxygen Saturation % -- 87 % 87 %   6 Minute Liters of Oxygen -- 0 L 0 L   2 Minute Post Oxygen Saturation % -- 93 % 94 %   2 Minute Post Liters of Oxygen -- 0 L 0 L      Oxygen Initial Assessment:  Oxygen Initial Assessment - 10/14/23 1431       Home Oxygen   Home Oxygen Device None    Sleep Oxygen Prescription CPAP   At night   Home Exercise Oxygen Prescription None    Home Resting Oxygen Prescription None    Compliance with Home Oxygen Use Yes   Compliance with CPAP use         Oxygen Re-Evaluation:  Oxygen Re-Evaluation     Row Name 10/27/23 1423 12/08/23 1407 01/01/24 1944         Program Oxygen Prescription   Program Oxygen Prescription None None None       Home Oxygen   Home Oxygen Device None None None     Sleep Oxygen Prescription CPAP CPAP CPAP     Home Exercise Oxygen Prescription None None None     Home Resting Oxygen Prescription None None  None     Compliance with Home Oxygen Use Yes Yes Yes       Goals/Expected Outcomes   Short Term Goals To learn and understand importance of monitoring SPO2 with pulse oximeter and demonstrate accurate use of the pulse oximeter.;To learn and understand importance of maintaining oxygen saturations>88%;To learn and demonstrate proper pursed lip breathing techniques or other breathing techniques. ;To learn and demonstrate proper use of respiratory medications To learn and understand importance of monitoring SPO2 with pulse oximeter and demonstrate accurate use of the pulse oximeter.;To learn and understand importance of maintaining oxygen saturations>88%;To learn and demonstrate proper pursed lip breathing techniques or other breathing techniques. ;To learn and demonstrate proper use of respiratory medications To learn and understand importance of monitoring  SPO2 with pulse oximeter and demonstrate accurate use of the pulse oximeter.;To learn and understand importance of maintaining oxygen saturations>88%;To learn and demonstrate proper pursed lip breathing techniques or other breathing techniques. ;To learn and demonstrate proper use of respiratory medications     Long  Term Goals Verbalizes importance of monitoring SPO2 with pulse oximeter and return demonstration;Exhibits proper breathing techniques, such as pursed lip breathing or other method taught during program session;Demonstrates proper use of MDI's;Compliance with respiratory medication;Maintenance of O2 saturations>88% Verbalizes importance of monitoring SPO2 with pulse oximeter and return demonstration;Exhibits proper breathing techniques, such as pursed lip breathing or other method taught during program session;Demonstrates proper use of MDI's;Compliance with respiratory medication;Maintenance of O2 saturations>88% Verbalizes importance of monitoring SPO2 with pulse oximeter and return demonstration;Exhibits proper breathing techniques, such as  pursed lip breathing or other method taught during program session;Demonstrates proper use of MDI's;Compliance with respiratory medication;Maintenance of O2 saturations>88%     Comments Patient is taking his prescribed medications as he should. States breathing has gotten a little better since starting the program as well. Kalieb states that his breathing kind of waxes and wanes. It is good for the most part but that it gets congested and harder to breathe when the weather changes. Julious is doing well in rehab.  His breathing has been getting a little better.  He is using his PLB.  He is compliant with his CPAP.     Goals/Expected Outcomes Short: Continue attending rehab and compliance with meds. Long: Increase exercise independance at home once program is finished. Short: Continue attending rehab and compliance with meds. Long: Increase exercise independance at home once program is finished. Short: COnitnue to monitor breathing Long; Conitnued compliance        Oxygen Discharge (Final Oxygen Re-Evaluation):  Oxygen Re-Evaluation - 01/01/24 1944       Program Oxygen Prescription   Program Oxygen Prescription None      Home Oxygen   Home Oxygen Device None    Sleep Oxygen Prescription CPAP    Home Exercise Oxygen Prescription None    Home Resting Oxygen Prescription None    Compliance with Home Oxygen Use Yes      Goals/Expected Outcomes   Short Term Goals To learn and understand importance of monitoring SPO2 with pulse oximeter and demonstrate accurate use of the pulse oximeter.;To learn and understand importance of maintaining oxygen saturations>88%;To learn and demonstrate proper pursed lip breathing techniques or other breathing techniques. ;To learn and demonstrate proper use of respiratory medications    Long  Term Goals Verbalizes importance of monitoring SPO2 with pulse oximeter and return demonstration;Exhibits proper breathing techniques, such as pursed lip breathing or other method  taught during program session;Demonstrates proper use of MDI's;Compliance with respiratory medication;Maintenance of O2 saturations>88%    Comments Laithan is doing well in rehab.  His breathing has been getting a little better.  He is using his PLB.  He is compliant with his CPAP.    Goals/Expected Outcomes Short: COnitnue to monitor breathing Long; Conitnued compliance          Initial Exercise Prescription:  Initial Exercise Prescription - 10/14/23 1500       Date of Initial Exercise RX and Referring Provider   Date 10/14/23    Referring Provider Darlean Sharper MD      Oxygen   Maintain Oxygen Saturation 88% or higher      Treadmill   MPH 1.5    Grade 0    Minutes 15  METs 1.9      REL-XR   Level 1    Speed 50    Minutes 15    METs 1.8      Prescription Details   Frequency (times per week) 2    Duration Progress to 30 minutes of continuous aerobic without signs/symptoms of physical distress      Intensity   THRR 40-80% of Max Heartrate 102/131    Ratings of Perceived Exertion 11-13    Perceived Dyspnea 0-4      Resistance Training   Training Prescription Yes    Weight 4    Reps 10-15          Perform Capillary Blood Glucose checks as needed.  Exercise Prescription Changes:   Exercise Prescription Changes     Row Name 10/14/23 1500 10/27/23 1500 11/10/23 1500 12/03/23 1500 12/22/23 1500     Response to Exercise   Blood Pressure (Admit) 130/80 128/80 152/58 128/72 118/70   Blood Pressure (Exercise) 142/80 130/80 156/70 -- --   Blood Pressure (Exit) 132/80 112/60 118/60 130/80 130/80   Heart Rate (Admit) 73 bpm 68 bpm 66 bpm 68 bpm 74 bpm   Heart Rate (Exercise) 150 bpm 108 bpm 110 bpm 97 bpm 113 bpm   Heart Rate (Exit) 86 bpm 87 bpm 78 bpm 86 bpm 92 bpm   Oxygen Saturation (Admit) 92 % 93 % 96 % 95 % 93 %   Oxygen Saturation (Exercise) 86 % 92 % 90 % 90 % 90 %   Oxygen Saturation (Exit) 93 % 94 % 93 % 94 % 92 %   Rating of Perceived Exertion  (Exercise) 13 16 13 14 14    Perceived Dyspnea (Exercise) 2 2 3 3 3    Duration Continue with 30 min of aerobic exercise without signs/symptoms of physical distress. Continue with 30 min of aerobic exercise without signs/symptoms of physical distress. Continue with 30 min of aerobic exercise without signs/symptoms of physical distress. Continue with 30 min of aerobic exercise without signs/symptoms of physical distress. Continue with 30 min of aerobic exercise without signs/symptoms of physical distress.   Intensity THRR unchanged THRR unchanged THRR unchanged THRR unchanged THRR unchanged     Progression   Progression Continue to progress workloads to maintain intensity without signs/symptoms of physical distress. Continue to progress workloads to maintain intensity without signs/symptoms of physical distress. Continue to progress workloads to maintain intensity without signs/symptoms of physical distress. Continue to progress workloads to maintain intensity without signs/symptoms of physical distress. Continue to progress workloads to maintain intensity without signs/symptoms of physical distress.     Resistance Training   Training Prescription -- Yes Yes Yes Yes   Weight -- 4 5 5 5    Reps -- 10-15 10-15 10-15 10-15     Treadmill   MPH -- 1.5 1.6 1.5 1.5   Grade -- 0.5 0.5 1 1    Minutes -- 15 15 15 15    METs -- 2.25 2.34 2.35 2.35     REL-XR   Level -- 2 2 2 2    Speed -- 46 55 56 43   Minutes -- 15 15 15 15    METs -- 3.4 4 3.1 2.6    Row Name 01/05/24 1500 01/19/24 1500 03/01/24 1500         Response to Exercise   Blood Pressure (Admit) 120/72 112/62 118/68     Blood Pressure (Exit) 118/52 110/50 112/62     Heart Rate (Admit) 77 bpm 75 bpm 81 bpm  Heart Rate (Exercise) 95 bpm 111 bpm 116 bpm     Heart Rate (Exit) 82 bpm 90 bpm 106 bpm     Oxygen Saturation (Admit) 92 % 92 % 96 %     Oxygen Saturation (Exercise) 89 % 90 % 90 %     Oxygen Saturation (Exit) 94 % 94 % 94 %      Rating of Perceived Exertion (Exercise) 15 14 14      Perceived Dyspnea (Exercise) 2 2 2      Duration Continue with 30 min of aerobic exercise without signs/symptoms of physical distress. Continue with 30 min of aerobic exercise without signs/symptoms of physical distress. Continue with 30 min of aerobic exercise without signs/symptoms of physical distress.     Intensity THRR unchanged THRR unchanged THRR unchanged       Progression   Progression Continue to progress workloads to maintain intensity without signs/symptoms of physical distress. Continue to progress workloads to maintain intensity without signs/symptoms of physical distress. Continue to progress workloads to maintain intensity without signs/symptoms of physical distress.       Resistance Training   Training Prescription Yes Yes Yes     Weight 5 5 5      Reps 10-15 10-15 10-15       Treadmill   MPH 2 2.1 2     Grade 2 2 2      Minutes 15 15 15      METs 3.08 3.19 3.08       REL-XR   Level 2 2 2      Speed 51 66 59     Minutes 15 15 15      METs 3.3 4.3 3.7        Exercise Comments:   Exercise Comments     Row Name 10/15/23 1409 03/08/24 1354         Exercise Comments First full day of exercise!  Patient was oriented to gym and equipment including functions, settings, policies, and procedures.  Patient's individual exercise prescription and treatment plan were reviewed.  All starting workloads were established based on the results of the 6 minute walk test done at initial orientation visit.  The plan for exercise progression was also introduced and progression will be customized based on patient's performance and goals. Darnel graduated today from  rehab with 36 sessions completed.  Details of the patient's exercise prescription and what He needs to do in order to continue the prescription and progress were discussed with patient.  Patient was given a copy of prescription and goals.  Patient verbalized understanding. Bandon  plans to continue to exercise by walking at home.         Exercise Goals and Review:   Exercise Goals     Row Name 10/14/23 1514             Exercise Goals   Increase Physical Activity Yes       Intervention Provide advice, education, support and counseling about physical activity/exercise needs.;Develop an individualized exercise prescription for aerobic and resistive training based on initial evaluation findings, risk stratification, comorbidities and participant's personal goals.       Expected Outcomes Short Term: Attend rehab on a regular basis to increase amount of physical activity.;Long Term: Add in home exercise to make exercise part of routine and to increase amount of physical activity.;Long Term: Exercising regularly at least 3-5 days a week.       Increase Strength and Stamina Yes       Intervention Provide  advice, education, support and counseling about physical activity/exercise needs.;Develop an individualized exercise prescription for aerobic and resistive training based on initial evaluation findings, risk stratification, comorbidities and participant's personal goals.       Expected Outcomes Short Term: Increase workloads from initial exercise prescription for resistance, speed, and METs.;Short Term: Perform resistance training exercises routinely during rehab and add in resistance training at home;Long Term: Improve cardiorespiratory fitness, muscular endurance and strength as measured by increased METs and functional capacity ( )       Able to understand and use rate of perceived exertion (RPE) scale Yes       Intervention Provide education and explanation on how to use RPE scale       Expected Outcomes Short Term: Able to use RPE daily in rehab to express subjective intensity level;Long Term:  Able to use RPE to guide intensity level when exercising independently       Able to understand and use Dyspnea scale Yes       Intervention Provide education and explanation  on how to use Dyspnea scale       Expected Outcomes Short Term: Able to use Dyspnea scale daily in rehab to express subjective sense of shortness of breath during exertion;Long Term: Able to use Dyspnea scale to guide intensity level when exercising independently       Knowledge and understanding of Target Heart Rate Range (THRR) Yes       Intervention Provide education and explanation of THRR including how the numbers were predicted and where they are located for reference       Expected Outcomes Short Term: Able to use daily as guideline for intensity in rehab;Short Term: Able to state/look up THRR;Long Term: Able to use THRR to govern intensity when exercising independently       Able to check pulse independently Yes       Intervention Provide education and demonstration on how to check pulse in carotid and radial arteries.;Review the importance of being able to check your own pulse for safety during independent exercise       Expected Outcomes Short Term: Able to explain why pulse checking is important during independent exercise;Long Term: Able to check pulse independently and accurately       Understanding of Exercise Prescription Yes       Intervention Provide education, explanation, and written materials on patient's individual exercise prescription       Expected Outcomes Short Term: Able to explain program exercise prescription;Long Term: Able to explain home exercise prescription to exercise independently          Exercise Goals Re-Evaluation :  Exercise Goals Re-Evaluation     Row Name 10/15/23 1410 10/27/23 1421 10/28/23 0833 11/12/23 0912 12/08/23 1408     Exercise Goal Re-Evaluation   Exercise Goals Review -- Increase Physical Activity;Increase Strength and Stamina;Able to understand and use Dyspnea scale;Able to understand and use rate of perceived exertion (RPE) scale;Able to check pulse independently Increase Physical Activity;Increase Strength and Stamina;Understanding of  Exercise Prescription Increase Physical Activity;Increase Strength and Stamina;Understanding of Exercise Prescription Increase Physical Activity;Increase Strength and Stamina;Able to understand and use rate of perceived exertion (RPE) scale;Able to understand and use Dyspnea scale;Able to check pulse independently;Knowledge and understanding of Target Heart Rate Range (THRR);Understanding of Exercise Prescription   Comments Reviewed RPE and dyspnea scale, THR and program prescription with pt today.  Pt voiced understanding and was given a copy of goals to take home. Naveen says he doesn't do much  exercise outside the program. He does minimal walking outside of here, but not much. Lelan is doing well in rehab and is on his 5th visit. He has increased his grade on the treadmill to 0.5 and has also increased his level on the XR to 2. WIll continue to montior and progress asable. Tyrell continues to do well in rehab, he is on his 9th visit. He has increads his walking speed to 1.6 on the treadmill with a 0.5 grade and is completing the whole 15 min on the treadmill. Will continue to monitor and progress as able. Rojelio states he does a little bit of walking at home, yet most of his exercise comes from here at the program.   Expected Outcomes Short: Use RPE daily to regulate intensity.  Long: Follow program prescription in THR. Short: Continue to attend rehab. Long term: Start exercising at home. Continue to attend rehab Continue to attend rehab Short: Continue to attend rehab. Long: Incorporate more home exercise into his routine.    Row Name 01/01/24 1110 02/23/24 1457           Exercise Goal Re-Evaluation   Exercise Goals Review Increase Physical Activity;Increase Strength and Stamina;Understanding of Exercise Prescription Increase Physical Activity;Increase Strength and Stamina;Understanding of Exercise Prescription      Comments Keeven is doing well overall in rehab. He is walking at home on his off days and  staying active at home.  He does note that his stamina has gotten better. Nicholaus is doing great in rehab. He is getting close to graduating from the program. He has noticed an increase in his endurancr since starting the program. He is increasing his level on the XR and walking speed on the treadmill. When he was on vaction he stated that he was able to walk around more then he use to. He is walking at home in the mornings when the heat is not has bad,      Expected Outcomes Short:Continue to walk more at home  Long: Contiue to improve stamina Short:Continue to walk more at home  Long: Contiue to improve stamina         Discharge Exercise Prescription (Final Exercise Prescription Changes):  Exercise Prescription Changes - 03/01/24 1500       Response to Exercise   Blood Pressure (Admit) 118/68    Blood Pressure (Exit) 112/62    Heart Rate (Admit) 81 bpm    Heart Rate (Exercise) 116 bpm    Heart Rate (Exit) 106 bpm    Oxygen Saturation (Admit) 96 %    Oxygen Saturation (Exercise) 90 %    Oxygen Saturation (Exit) 94 %    Rating of Perceived Exertion (Exercise) 14    Perceived Dyspnea (Exercise) 2    Duration Continue with 30 min of aerobic exercise without signs/symptoms of physical distress.    Intensity THRR unchanged      Progression   Progression Continue to progress workloads to maintain intensity without signs/symptoms of physical distress.      Resistance Training   Training Prescription Yes    Weight 5    Reps 10-15      Treadmill   MPH 2    Grade 2    Minutes 15    METs 3.08      REL-XR   Level 2    Speed 59    Minutes 15    METs 3.7          Nutrition:  Target Goals: Understanding of nutrition  guidelines, daily intake of sodium 1500mg , cholesterol 200mg , calories 30% from fat and 7% or less from saturated fats, daily to have 5 or more servings of fruits and vegetables.  Biometrics:  Pre Biometrics - 10/14/23 1515       Pre Biometrics   Height 5' 7  (1.702 m)    Weight 105.7 kg    Waist Circumference 50 inches    Hip Circumference 41 inches    Waist to Hip Ratio 1.22 %    BMI (Calculated) 36.49    Grip Strength 26.7 kg    Single Leg Stand 4 seconds          Post Biometrics - 02/25/24 1352        Post  Biometrics   Height 5' 7 (1.702 m)    Weight 100.6 kg    Waist Circumference 46 inches    Hip Circumference 47 inches    Waist to Hip Ratio 0.98 %    BMI (Calculated) 34.73    Grip Strength 31.4 kg    Single Leg Stand 6 seconds          Nutrition Therapy Plan and Nutrition Goals:   Nutrition Assessments:  Nutrition Assessments - 10/14/23 1517       Rate Your Plate Scores   Pre Score 0.89         MEDIFICTS Score Key: >=70 Need to make dietary changes  40-70 Heart Healthy Diet <= 40 Therapeutic Level Cholesterol Diet  Flowsheet Row PULMONARY REHAB CHRONIC OBSTRUCTIVE PULMONARY DISEASE from 02/25/2024 in [facility]  Picture Your Plate Total Score on Discharge 80   Picture Your Plate Scores: <59 Unhealthy dietary pattern with much room for improvement. 41-50 Dietary pattern unlikely to meet recommendations for good health and room for improvement. 51-60 More healthful dietary pattern, with some room for improvement.  >60 Healthy dietary pattern, although there may be some specific behaviors that could be improved.    Nutrition Goals Re-Evaluation:  Nutrition Goals Re-Evaluation     Row Name 10/27/23 1416 12/08/23 1404 01/01/24 1941 02/23/24 1504       Goals   Current Weight -- 232 lb (105.2 kg) -- --    Nutrition Goal Continue to attend rehab and eat healthy. Continue to attend rehab and eat healthy. Short: Continue to eat healthy. Long: Him and his spouse achieve some weight loss. Healthy eating    Comment Doren says him and his partner overall try to eat a healthy diet. They did have a cheat weekend at the [facility], but they split the cheesecake. Farzad states him and  his spouse have had a complete change of how they eat. His spouse enrolled in [place] [facility], so they are teaching them portion control. They also fast for 16 hours as part of their diet. Thelbert has been doing well with his diet.  He is eating better than when he started.  He is working with his wide to work on portion control.  He is eating mainly 1 main meal a day. Jozsef has been doing well on his diet. He is trying to stick to helathy foods and portion control. He did have a cheat weekend with smoked meat and fish due to the holiday. He does not eat many sweets and does not really like salty things. He will eat unsalted nuts as a snack. He drinks a lot of water and will have one diet soda every other week.    Expected Outcome Short: Eat  healthy. Long term: Lose some weight and work on portion control. Short: Continue to eat healthy. Long: Him and his spouse achieve some weight loss. Short; continue to work on portion control Long: Continue to follow healthy eating Short; continue to work on portion control Long: Continue to follow healthy eating       Nutrition Goals Discharge (Final Nutrition Goals Re-Evaluation):  Nutrition Goals Re-Evaluation - 02/23/24 1504       Goals   Nutrition Goal Healthy eating    Comment Sencere has been doing well on his diet. He is trying to stick to helathy foods and portion control. He did have a cheat weekend with smoked meat and fish due to the holiday. He does not eat many sweets and does not really like salty things. He will eat unsalted nuts as a snack. He drinks a lot of water and will have one diet soda every other week.    Expected Outcome Short; continue to work on portion control Long: Continue to follow healthy eating          Psychosocial: Target Goals: Acknowledge presence or absence of significant depression and/or stress, maximize coping skills, provide positive support system. Participant is able to verbalize types and ability to use  techniques and skills needed for reducing stress and depression.  Initial Review & Psychosocial Screening:  Initial Psych Review & Screening - 10/14/23 1432       Initial Review   Current issues with Current Sleep Concerns   Wakes up once every night- falls back asleep within the hour.     Family Dynamics   Good Support System? Yes    Comments Husband Grayce is support system.      Barriers   Psychosocial barriers to participate in program There are no identifiable barriers or psychosocial needs.;The patient should benefit from training in stress management and relaxation.      Screening Interventions   Interventions Encouraged to exercise;Provide feedback about the scores to participant;To provide support and resources with identified psychosocial needs    Expected Outcomes Short Term goal: Utilizing psychosocial counselor, staff and physician to assist with identification of specific Stressors or current issues interfering with healing process. Setting desired goal for each stressor or current issue identified.;Long Term Goal: Stressors or current issues are controlled or eliminated.;Short Term goal: Identification and review with participant of any Quality of Life or Depression concerns found by scoring the questionnaire.;Long Term goal: The participant improves quality of Life and PHQ9 Scores as seen by post scores and/or verbalization of changes          Quality of Life Scores:  Scores of 19 and below usually indicate a poorer quality of life in these areas.  A difference of  2-3 points is a clinically meaningful difference.  A difference of 2-3 points in the total score of the Quality of Life Index has been associated with significant improvement in overall quality of life, self-image, physical symptoms, and general health in studies assessing change in quality of life.   PHQ-9: Review Flowsheet  More data exists      02/25/2024 10/12/2023 06/19/2023 06/09/2023 05/13/2023   Depression screen PHQ 2/9  Decreased Interest 0 0 0 0 0  Down, Depressed, Hopeless 0 0 0 0 0  PHQ - 2 Score 0 0 0 0 0  Altered sleeping 0 1 0 0 -  Tired, decreased energy 0 - 0 0 -  Change in appetite 0 0 0 0 -  Feeling bad or  failure about yourself  0 0 0 0 -  Trouble concentrating 1 0 0 0 -  Moving slowly or fidgety/restless 0 0 0 0 -  Suicidal thoughts 0 0 0 0 -  PHQ-9 Score 1 1 0 0 -  Difficult doing work/chores Not difficult at all Not difficult at all Not difficult at all Not difficult at all -   Interpretation of Total Score  Total Score Depression Severity:  1-4 = Minimal depression, 5-9 = Mild depression, 10-14 = Moderate depression, 15-19 = Moderately severe depression, 20-27 = Severe depression   Psychosocial Evaluation and Intervention:  Psychosocial Evaluation - 10/14/23 1524       Psychosocial Evaluation & Interventions   Interventions Encouraged to exercise with the program and follow exercise prescription;Relaxation education;Stress management education    Comments Osric was referred to this program for his diagnosis of COPD. Currently he gets short of breath with minimal activities, yet he wants to improve this to not be as short of breath as often. 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 He occasionally will get dizzy first thing in the mornings, but it resolves shortly after sitting on the side of the bed. He denies any stressors in his life, his support system consists of his signifcant other, Robin. Jim is wanting to regain his strength and stamina and not be so short of breath as a result of this program. No barries identified to complete the program.    Continue Psychosocial Services  Follow up required by staff          Psychosocial Re-Evaluation:  Psychosocial Re-Evaluation     Row Name 10/27/23 1414 12/08/23 1402 01/01/24 1125 02/23/24 1500       Psychosocial Re-Evaluation    Current issues with None Identified None Identified None Identified Current Stress Concerns    Comments Chael states that his stress level is at a good level, no current life stressors. He says he is sleeping well and his support system is great. Atlee states that he currently doesn't have any stress and that he is sleeping well. He has a great support system which is his spouse. Ulus is doing well in rehab.  He is feeling good mentally and enjoys coming to class. He is still sleeping well. Jamier is doing well in rehab. He is feeling good overall. He is sleeping well. He only has sleeping issues when he gets nasel congestion due to sleeping with a CPAP and not able to breath through his nose . He stated that he does have some stress due to being in the process of buying a house in [place]. They are talking about moving there full time instead of having it as a vaction spot. This is causing him some stress due to the unknown but he seems excited about having a place there.    Expected Outcomes Short: Continue to attend rehab. Long: Continue to have as few stressors in life as possible and continue sleeping well. Short: Continue to attend rehab. Long: Continue the stress relief techniques that are working in his life. Short: Continue to exercise for mental boost Long: Continue to stay positive Short: Continue to exercise for mental boost Long: Continue to stay positive    Interventions Encouraged to attend Pulmonary Rehabilitation for the exercise Encouraged to attend Pulmonary Rehabilitation for the exercise Encouraged to attend Pulmonary Rehabilitation for the exercise Encouraged to attend Pulmonary Rehabilitation for the  exercise    Continue Psychosocial Services  Follow up required by staff Follow up required by staff Follow up required by staff Follow up required by staff       Psychosocial Discharge (Final Psychosocial Re-Evaluation):  Psychosocial Re-Evaluation - 02/23/24 1500       Psychosocial  Re-Evaluation   Current issues with Current Stress Concerns    Comments Dallis is doing well in rehab. He is feeling good overall. He is sleeping well. He only has sleeping issues when he gets nasel congestion due to sleeping with a CPAP and not able to breath through his nose . He stated that he does have some stress due to being in the process of buying a house in [place]. They are talking about moving there full time instead of having it as a vaction spot. This is causing him some stress due to the unknown but he seems excited about having a place there.    Expected Outcomes Short: Continue to exercise for mental boost Long: Continue to stay positive    Interventions Encouraged to attend Pulmonary Rehabilitation for the exercise    Continue Psychosocial Services  Follow up required by staff           Education: Education Goals: Education classes will be provided on a weekly basis, covering required topics. Participant will state understanding/return demonstration of topics presented.  Learning Barriers/Preferences:  Learning Barriers/Preferences - 10/14/23 1435       Learning Barriers/Preferences   Learning Barriers None    Learning Preferences Written Material;Video;Individual Instruction;Group Instruction          Education Topics: How Lungs Work and Diseases: - Discuss the anatomy of the lungs and diseases that can affect the lungs, such as COPD. Flowsheet Row PULMONARY REHAB CHRONIC OBSTRUCTIVE PULMONARY DISEASE from 03/03/2024 in [employer] [facility]  Date 11/19/23  Educator Hb  Instruction Review Code 1- Verbalizes Understanding    Exercise: -Discuss the importance of exercise, FITT principles of exercise, normal and abnormal responses to exercise, and how to exercise safely. Flowsheet Row PULMONARY REHAB CHRONIC OBSTRUCTIVE PULMONARY DISEASE from 03/03/2024 in [place] [facility]  Date 02/11/24  Educator HB  Instruction Review Code 1-  Verbalizes Understanding    Environmental Irritants: -Discuss types of environmental irritants and how to limit exposure to environmental irritants.   Meds/Inhalers and oxygen: - Discuss respiratory medications, definition of an inhaler and oxygen, and the proper way to use an inhaler and oxygen.   Energy Saving Techniques: - Discuss methods to conserve energy and decrease shortness of breath when performing activities of daily living.    Bronchial Hygiene / Breathing Techniques: - Discuss breathing mechanics, pursed-lip breathing technique,  proper posture, effective ways to clear airways, and other functional breathing techniques   Cleaning Equipment: - Provides group verbal and written instruction about the health risks of elevated stress, cause of high stress, and healthy ways to reduce stress.   Nutrition I: Fats: - Discuss the types of cholesterol, what cholesterol does to the body, and how cholesterol levels can be controlled. Flowsheet Row PULMONARY REHAB CHRONIC OBSTRUCTIVE PULMONARY DISEASE from 03/03/2024 in [place] [facility]  Date 10/22/23  Educator [facility]  Instruction Review Code 1- Verbalizes Understanding    Nutrition II: Labels: -Discuss the different components of food labels and how to read food labels. Flowsheet Row PULMONARY REHAB CHRONIC OBSTRUCTIVE PULMONARY DISEASE from 03/03/2024 in [place] [facility]  Date 12/24/23  Educator HB  Instruction Review Code 1- Verbalizes Understanding  Respiratory Infections: - Discuss the signs and symptoms of respiratory infections, ways to prevent respiratory infections, and the importance of seeking medical treatment when having a respiratory infection.   Stress I: Signs and Symptoms: - Discuss the causes of stress, how stress may lead to anxiety and depression, and ways to limit stress. Flowsheet Row PULMONARY REHAB CHRONIC OBSTRUCTIVE PULMONARY DISEASE from 03/03/2024 in [place] [facility]  Date 10/29/23  Educator DJ  Instruction Review Code 1- Verbalizes Understanding    Stress II: Relaxation: -Discuss relaxation techniques to limit stress. Flowsheet Row PULMONARY REHAB CHRONIC OBSTRUCTIVE PULMONARY DISEASE from 03/03/2024 in [place] [facility]  Date 10/15/23  Educator [facility]  Instruction Review Code 1- Verbalizes Understanding    Oxygen for Home/Travel: - Discuss how to prepare for travel when on oxygen and proper ways to transport and store oxygen to ensure safety.   Knowledge Questionnaire Score:  Knowledge Questionnaire Score - 02/25/24 1401       Knowledge Questionnaire Score   Post Score 16/18          Core Components/Risk Factors/Patient Goals at Admission:  Personal Goals and Risk Factors at Admission - 10/14/23 1519       Core Components/Risk Factors/Patient Goals on Admission    Weight Management Yes    Intervention Weight Management: Develop a combined nutrition and exercise program designed to reach desired caloric intake, while maintaining appropriate intake of nutrient and fiber, sodium and fats, and appropriate energy expenditure required for the weight goal.;Weight Management: Provide education and appropriate resources to help participant work on and attain dietary goals.;Weight Management/Obesity: Establish reasonable short term and long term weight goals.    Admit Weight 233 lb 0.4 oz (105.7 kg)    Improve shortness of breath with ADL's Yes    Intervention Provide education, individualized exercise plan and daily activity instruction to help decrease symptoms of SOB with activities of daily living.    Expected Outcomes Short Term: Improve cardiorespiratory fitness to achieve a reduction of symptoms when performing ADLs    Increase knowledge of respiratory medications and ability to use respiratory devices properly  Yes    Intervention Provide education and demonstration as needed of appropriate use of  medications, inhalers, and oxygen therapy.    Expected Outcomes Short Term: Achieves understanding of medications use. Understands that oxygen is a medication prescribed by physician. Demonstrates appropriate use of inhaler and oxygen therapy.;Long Term: Maintain appropriate use of medications, inhalers, and oxygen therapy.    Diabetes Yes    Intervention Provide education about signs/symptoms and action to take for hypo/hyperglycemia.;Provide education about proper nutrition, including hydration, and aerobic/resistive exercise prescription along with prescribed medications to achieve blood glucose in normal ranges: Fasting glucose 65-99 mg/dL    Expected Outcomes Short Term: Participant verbalizes understanding of the signs/symptoms and immediate care of hyper/hypoglycemia, proper foot care and importance of medication, aerobic/resistive exercise and nutrition plan for blood glucose control.;Long Term: Attainment of HbA1C < 7%.    Hypertension Yes    Intervention Monitor prescription use compliance.;Provide education on lifestyle modifcations including regular physical activity/exercise, weight management, moderate sodium restriction and increased consumption of fresh fruit, vegetables, and low fat dairy, alcohol moderation, and smoking cessation.    Expected Outcomes Long Term: Maintenance of blood pressure at goal levels.;Short Term: Continued assessment and intervention until BP is < 140/53mm HG in hypertensive participants. < 130/19mm HG in hypertensive participants with diabetes, heart failure or chronic kidney disease.    Lipids Yes  Intervention Provide education and support for participant on nutrition & aerobic/resistive exercise along with prescribed medications to achieve LDL 70mg , HDL >40mg .    Expected Outcomes Short Term: Participant states understanding of desired cholesterol values and is compliant with medications prescribed. Participant is following exercise prescription and nutrition  guidelines.;Long Term: Cholesterol controlled with medications as prescribed, with individualized exercise RX and with personalized nutrition plan. Value goals: LDL < 70mg , HDL > 40 mg.          Core Components/Risk Factors/Patient Goals Review:   Goals and Risk Factor Review     Row Name 10/27/23 1418 12/08/23 1406 01/01/24 1943 02/23/24 1508       Core Components/Risk Factors/Patient Goals Review   Personal Goals Review Weight Management/Obesity;Increase knowledge of respiratory medications and ability to use respiratory devices properly.;Lipids Weight Management/Obesity;Increase knowledge of respiratory medications and ability to use respiratory devices properly.;Lipids;Hypertension Weight Management/Obesity;Increase knowledge of respiratory medications and ability to use respiratory devices properly.;Lipids;Hypertension;Improve shortness of breath with ADL's Weight Management/Obesity;Increase knowledge of respiratory medications and ability to use respiratory devices properly.;Lipids;Hypertension;Improve shortness of breath with ADL's    Review Seeley said his breathing has been a little better since attending the program. He does not have a pulse oximeter to check his sat's at home but he is planning on buying one in the near future. His BP runs stable like it does here. He is taking his medications like he is supposed to. Navon states he is taking his medications as he should at home. He states he checks his pulse ox several times a day and it is in the mid 90's. Kion is doing well in rehab.  He is starting to lose weight.  His pressures are doing well.  His breathing has gotten a little better.  His meds are doing welll. He wants to continue to improve Jaquane is doing well in rehab. He is continuing to eat healthy and watching his weight. He has noticed that his breathing is better when the weather is not so hot. He has his good days and bad days. When he was in [place] he noticed that sicne the  weather was cooler with  the constant wind that he was able to breath better and noticed that his endurace was better.    Expected Outcomes Short term: Continue to attend rehab. Long term: Breathing continue to gets better. Short term: Continue to attend rehab. Long term: Breathing continue to gets better. Short: Continue to work on weight loss lOng: Contineu to monitor breathing Short: continue to focuse on breathing  long: continue to pick healthy options for weight managment       Core Components/Risk Factors/Patient Goals at Discharge (Final Review):   Goals and Risk Factor Review - 02/23/24 1508       Core Components/Risk Factors/Patient Goals Review   Personal Goals Review Weight Management/Obesity;Increase knowledge of respiratory medications and ability to use respiratory devices properly.;Lipids;Hypertension;Improve shortness of breath with ADL's    Review Minard is doing well in rehab. He is continuing to eat healthy and watching his weight. He has noticed that his breathing is better when the weather is not so hot. He has his good days and bad days. When he was in [place] he noticed that sicne the weather was cooler with  the constant wind that he was able to breath better and noticed that his endurace was better.    Expected Outcomes Short: continue to focuse on breathing  long: continue to pick healthy options for  weight managment          ITP Comments:  ITP Comments     Row Name 10/14/23 1533 10/15/23 1409 11/11/23 1145 12/09/23 1446 01/06/24 1130   ITP Comments Patient attend orientation today.  Patient is attending Pulmonary Rehabilitation Program.  Documentation for diagnosis can be found in [facility]/EPIC.  Reviewed medical chart, RPE/RPD, gym safety, and program guidelines.  Patient was fitted to equipment they will be using during rehab.  Patient is scheduled to start exercise on 10/15/23.   Initial ITP created and sent for review and signature by Dr. Anton Kelp, Medical  Director for Pulmonary Rehabilitation Program. First full day of exercise!  Patient was oriented to gym and equipment including functions, settings, policies, and procedures.  Patient's individual exercise prescription and treatment plan were reviewed.  All starting workloads were established based on the results of the 6 minute walk test done at initial orientation visit.  The plan for exercise progression was also introduced and progression will be customized based on patient's performance and goals. 30 day review completed. ITP sent to Dr.Jehanzeb Memon, Medical Director of  Pulmonary Rehab. Continue with ITP unless changes are made by physician. 30 day review completed. ITP sent to Dr.Jehanzeb Memon, Medical Director of  Pulmonary Rehab. Continue with ITP unless changes are made by physician. 30 day review completed. ITP sent to Dr.Jehanzeb Memon, Medical Director of  Pulmonary Rehab. Continue with ITP unless changes are made by physician.    Row Name 02/03/24 1027 03/02/24 0941 03/08/24 1355       ITP Comments 30 day review completed. ITP sent to Dr.Jehanzeb Memon, Medical Director of  Pulmonary Rehab. Continue with ITP unless changes are made by physician. Patient has missed 2 weeks on vacation. Not able to assess goals this review period. 30 day review completed. ITP sent to Dr.Jehanzeb Memon, Medical Director of  Pulmonary Rehab. Continue with ITP unless changes are made by physician. Darrelle graduated today from  rehab with 36 sessions completed.  Details of the patient's exercise prescription and what He needs to do in order to continue the prescription and progress were discussed with patient.  Patient was given a copy of prescription and goals.  Patient verbalized understanding. Sheppard plans to continue to exercise by walking at home.        Comments: Discharge ITP

## 2024-03-08 NOTE — Progress Notes (Signed)
 Discharge Progress Report  Patient Details  Name: Jerome Sanchez MRN: [id number] Date of Birth: May 28, 1951 Referring Provider:   Flowsheet Row PULMONARY REHAB COPD ORIENTATION from 10/14/2023 in [facility] CARDIAC REHABILITATION  Referring Provider Darlean Sharper MD     Number of Visits: 36  Reason for Discharge:  Patient reached a stable level of exercise. Patient independent in their exercise. Patient has met program and personal goals.  Smoking History:  Social History   Tobacco Use  Smoking Status Former   Current packs/day: 0.00   Average packs/day: 1.3 packs/day for 51.0 years (63.8 ttl pk-yrs)   Types: Cigarettes   Start date: 32   Quit date: 2018   Years since quitting: 7.5  Smokeless Tobacco Never    Diagnosis:  Chronic obstructive pulmonary disease, unspecified COPD type (HCC)  ADL UCSD:  Pulmonary Assessment Scores     Row Name 10/14/23 1522 02/25/24 1401       ADL UCSD   ADL Phase Entry Exit    SOB Score total 33 40    Rest 0 0    Walk 2 0    Stairs 3 2    Bath 1 1    Dress 0 1    Shop 1 2      CAT Score   CAT Score 14 15      mMRC Score   mMRC Score -- 2       Initial Exercise Prescription:  Initial Exercise Prescription - 10/14/23 1500       Date of Initial Exercise RX and Referring Provider   Date 10/14/23    Referring Provider Darlean Sharper MD      Oxygen   Maintain Oxygen Saturation 88% or higher      Treadmill   MPH 1.5    Grade 0    Minutes 15    METs 1.9      REL-XR   Level 1    Speed 50    Minutes 15    METs 1.8      Prescription Details   Frequency (times per week) 2    Duration Progress to 30 minutes of continuous aerobic without signs/symptoms of physical distress      Intensity   THRR 40-80% of Max Heartrate 102/131    Ratings of Perceived Exertion 11-13    Perceived Dyspnea 0-4      Resistance Training   Training Prescription Yes    Weight 4    Reps 10-15          Discharge Exercise  Prescription (Final Exercise Prescription Changes):  Exercise Prescription Changes - 03/01/24 1500       Response to Exercise   Blood Pressure (Admit) 118/68    Blood Pressure (Exit) 112/62    Heart Rate (Admit) 81 bpm    Heart Rate (Exercise) 116 bpm    Heart Rate (Exit) 106 bpm    Oxygen Saturation (Admit) 96 %    Oxygen Saturation (Exercise) 90 %    Oxygen Saturation (Exit) 94 %    Rating of Perceived Exertion (Exercise) 14    Perceived Dyspnea (Exercise) 2    Duration Continue with 30 min of aerobic exercise without signs/symptoms of physical distress.    Intensity THRR unchanged      Progression   Progression Continue to progress workloads to maintain intensity without signs/symptoms of physical distress.      Resistance Training   Training Prescription Yes    Weight 5  Reps 10-15      Treadmill   MPH 2    Grade 2    Minutes 15    METs 3.08      REL-XR   Level 2    Speed 59    Minutes 15    METs 3.7          Functional Capacity:  6 Minute Walk     Row Name 10/14/23 1510 10/14/23 1511 02/25/24 1350     6 Minute Walk   Phase Initial -- Discharge   Distance 1200 feet -- 1330 feet   Walk Time 6 minutes -- 6 minutes   # of Rest Breaks 0 -- 0   MPH 2.27 -- 2.52   METS 2.6 -- 2.5   RPE 13 -- 13   Perceived Dyspnea  2 -- 2   VO2 Peak 9.1 -- 8.75   Symptoms No -- No   Resting HR 73 bpm -- 70 bpm   Resting BP 130/80 -- 122/74   Resting Oxygen Saturation  92 % -- 94 %   Exercise Oxygen Saturation  during 6 min walk 96 % -- 87 %   Max Ex. HR 150 bpm -- 113 bpm   Max Ex. BP 142/80 -- 138/78   2 Minute Post BP 132/80 -- 120/76     Interval HR   1 Minute HR 133 -- 99   2 Minute HR 140 -- 102   3 Minute HR 146 -- 107   4 Minute HR 140 -- 110   5 Minute HR 143 -- 110   6 Minute HR 150 -- 113   2 Minute Post HR 86 -- 74   Interval Heart Rate? Yes -- Yes     Interval Oxygen   Interval Oxygen? -- Yes Yes   Baseline Oxygen Saturation % -- 92 % 97 %   1  Minute Oxygen Saturation % -- 88 % 91 %   1 Minute Liters of Oxygen -- 0 L 0 L   2 Minute Oxygen Saturation % -- 86 % 90 %   2 Minute Liters of Oxygen -- 0 L 0 L   3 Minute Oxygen Saturation % -- 86 % 90 %   3 Minute Liters of Oxygen -- 0 L 0 L   4 Minute Oxygen Saturation % -- 86 % 91 %   4 Minute Liters of Oxygen -- 0 L 0 L   5 Minute Oxygen Saturation % -- 87 % 90 %   5 Minute Liters of Oxygen -- 0 L 0 L   6 Minute Oxygen Saturation % -- 87 % 87 %   6 Minute Liters of Oxygen -- 0 L 0 L   2 Minute Post Oxygen Saturation % -- 93 % 94 %   2 Minute Post Liters of Oxygen -- 0 L 0 L      Psychological, QOL, Others - Outcomes: PHQ 2/9:    02/25/2024    2:02 PM 10/12/2023    9:14 AM 06/19/2023   10:11 AM 06/09/2023    2:43 PM 05/13/2023   10:48 AM  Depression screen PHQ 2/9  Decreased Interest 0 0 0 0 0  Down, Depressed, Hopeless 0 0 0 0 0  PHQ - 2 Score 0 0 0 0 0  Altered sleeping 0 1 0 0   Tired, decreased energy 0  0 0   Change in appetite 0 0 0 0   Feeling bad or failure about yourself  0 0 0 0   Trouble concentrating 1 0 0 0   Moving slowly or fidgety/restless 0 0 0 0   Suicidal thoughts 0 0 0 0   PHQ-9 Score 1 1 0 0   Difficult doing work/chores Not difficult at all Not difficult at all Not difficult at all Not difficult at all    Nutrition & Weight - Outcomes:  Pre Biometrics - 10/14/23 1515       Pre Biometrics   Height 5' 7 (1.702 m)    Weight 105.7 kg    Waist Circumference 50 inches    Hip Circumference 41 inches    Waist to Hip Ratio 1.22 %    BMI (Calculated) 36.49    Grip Strength 26.7 kg    Single Leg Stand 4 seconds          Post Biometrics - 02/25/24 1352        Post  Biometrics   Height 5' 7 (1.702 m)    Weight 100.6 kg    Waist Circumference 46 inches    Hip Circumference 47 inches    Waist to Hip Ratio 0.98 %    BMI (Calculated) 34.73    Grip Strength 31.4 kg    Single Leg Stand 6 seconds

## 2024-03-10 ENCOUNTER — Encounter (HOSPITAL_COMMUNITY)

## 2024-03-15 ENCOUNTER — Encounter (HOSPITAL_COMMUNITY)

## 2024-03-17 ENCOUNTER — Encounter (HOSPITAL_COMMUNITY)

## 2024-03-17 ENCOUNTER — Ambulatory Visit: Payer: Self-pay | Admitting: Internal Medicine

## 2024-03-29 ENCOUNTER — Other Ambulatory Visit: Payer: Self-pay | Admitting: Family Medicine

## 2024-03-31 DIAGNOSIS — N1831 Chronic kidney disease, stage 3a: Secondary | ICD-10-CM | POA: Diagnosis not present

## 2024-03-31 DIAGNOSIS — Z125 Encounter for screening for malignant neoplasm of prostate: Secondary | ICD-10-CM | POA: Diagnosis not present

## 2024-03-31 DIAGNOSIS — E538 Deficiency of other specified B group vitamins: Secondary | ICD-10-CM | POA: Diagnosis not present

## 2024-03-31 DIAGNOSIS — Z79899 Other long term (current) drug therapy: Secondary | ICD-10-CM | POA: Diagnosis not present

## 2024-03-31 DIAGNOSIS — E118 Type 2 diabetes mellitus with unspecified complications: Secondary | ICD-10-CM | POA: Diagnosis not present

## 2024-03-31 DIAGNOSIS — I10 Essential (primary) hypertension: Secondary | ICD-10-CM | POA: Diagnosis not present

## 2024-03-31 DIAGNOSIS — E782 Mixed hyperlipidemia: Secondary | ICD-10-CM | POA: Diagnosis not present

## 2024-04-02 LAB — BASIC METABOLIC PANEL WITH GFR
BUN/Creatinine Ratio: 17 (ref 10–24)
BUN: 25 mg/dL (ref 8–27)
CO2: 22 mmol/L (ref 20–29)
Calcium: 9.9 mg/dL (ref 8.6–10.2)
Chloride: 102 mmol/L (ref 96–106)
Creatinine, Ser: 1.43 mg/dL — ABNORMAL HIGH (ref 0.76–1.27)
Glucose: 109 mg/dL — ABNORMAL HIGH (ref 70–99)
Potassium: 4.6 mmol/L (ref 3.5–5.2)
Sodium: 141 mmol/L (ref 134–144)
eGFR: 51 mL/min/1.73 — ABNORMAL LOW (ref >59)

## 2024-04-02 LAB — LIPID PANEL
Chol/HDL Ratio: 2 ratio (ref 0.0–5.0)
Cholesterol, Total: 146 mg/dL (ref 100–199)
HDL: 74 mg/dL (ref >39)
LDL Chol Calc (NIH): 58 mg/dL (ref 0–99)
Triglycerides: 71 mg/dL (ref 0–149)
VLDL Cholesterol Cal: 14 mg/dL (ref 5–40)

## 2024-04-02 LAB — HEPATIC FUNCTION PANEL
ALT: 31 IU/L (ref 0–44)
AST: 40 IU/L (ref 0–40)
Albumin: 4.7 g/dL (ref 3.8–4.8)
Alkaline Phosphatase: 96 IU/L (ref 44–121)
Bilirubin Total: 0.8 mg/dL (ref 0.0–1.2)
Bilirubin, Direct: 0.32 mg/dL (ref 0.00–0.40)
Total Protein: 7.5 g/dL (ref 6.0–8.5)

## 2024-04-02 LAB — MICROALBUMIN / CREATININE URINE RATIO
Creatinine, Urine: 24.6 mg/dL
Microalb/Creat Ratio: 81 mg/g{creat} — ABNORMAL HIGH (ref 0–29)
Microalbumin, Urine: 19.9 ug/mL

## 2024-04-02 LAB — HEMOGLOBIN A1C
Est. average glucose Bld gHb Est-mCnc: 137 mg/dL
Hgb A1c MFr Bld: 6.4 % — ABNORMAL HIGH (ref 4.8–5.6)

## 2024-04-02 LAB — VITAMIN B12: Vitamin B-12: 1138 pg/mL (ref 232–1245)

## 2024-04-02 LAB — PSA: Prostate Specific Ag, Serum: 1.7 ng/mL (ref 0.0–4.0)

## 2024-04-04 ENCOUNTER — Ambulatory Visit: Payer: Self-pay | Admitting: Family Medicine

## 2024-04-08 ENCOUNTER — Other Ambulatory Visit: Payer: Self-pay | Admitting: Family Medicine

## 2024-04-11 ENCOUNTER — Ambulatory Visit (INDEPENDENT_AMBULATORY_CARE_PROVIDER_SITE_OTHER): Payer: PPO | Admitting: Family Medicine

## 2024-04-11 ENCOUNTER — Encounter: Payer: Self-pay | Admitting: Family Medicine

## 2024-04-11 ENCOUNTER — Ambulatory Visit (HOSPITAL_COMMUNITY)
Admission: RE | Admit: 2024-04-11 | Discharge: 2024-04-11 | Disposition: A | Discharge status: Home / Self Care | Source: Ambulatory Visit | Attending: Family Medicine | Admitting: Family Medicine

## 2024-04-11 VITALS — BP 117/71 | HR 75 | Temp 97.3°F | Ht 67.0 in | Wt 216.0 lb

## 2024-04-11 DIAGNOSIS — M1612 Unilateral primary osteoarthritis, left hip: Secondary | ICD-10-CM | POA: Diagnosis not present

## 2024-04-11 DIAGNOSIS — E782 Mixed hyperlipidemia: Secondary | ICD-10-CM | POA: Diagnosis not present

## 2024-04-11 DIAGNOSIS — M25552 Pain in left hip: Secondary | ICD-10-CM | POA: Insufficient documentation

## 2024-04-11 DIAGNOSIS — M545 Low back pain, unspecified: Secondary | ICD-10-CM | POA: Insufficient documentation

## 2024-04-11 DIAGNOSIS — N1831 Chronic kidney disease, stage 3a: Secondary | ICD-10-CM | POA: Diagnosis not present

## 2024-04-11 DIAGNOSIS — I2510 Atherosclerotic heart disease of native coronary artery without angina pectoris: Secondary | ICD-10-CM | POA: Diagnosis not present

## 2024-04-11 DIAGNOSIS — M25559 Pain in unspecified hip: Secondary | ICD-10-CM | POA: Diagnosis not present

## 2024-04-11 DIAGNOSIS — K219 Gastro-esophageal reflux disease without esophagitis: Secondary | ICD-10-CM | POA: Diagnosis not present

## 2024-04-11 DIAGNOSIS — I10 Essential (primary) hypertension: Secondary | ICD-10-CM

## 2024-04-11 DIAGNOSIS — J449 Chronic obstructive pulmonary disease, unspecified: Secondary | ICD-10-CM | POA: Diagnosis not present

## 2024-04-11 DIAGNOSIS — E118 Type 2 diabetes mellitus with unspecified complications: Secondary | ICD-10-CM | POA: Diagnosis not present

## 2024-04-11 DIAGNOSIS — M48061 Spinal stenosis, lumbar region without neurogenic claudication: Secondary | ICD-10-CM | POA: Diagnosis not present

## 2024-04-11 DIAGNOSIS — M5136 Other intervertebral disc degeneration, lumbar region with discogenic back pain only: Secondary | ICD-10-CM | POA: Diagnosis not present

## 2024-04-11 MED ORDER — FUROSEMIDE 40 MG PO TABS: 40.0000 mg | ORAL_TABLET | Freq: Every day | ORAL | 3 refills | Status: AC

## 2024-04-11 MED ORDER — EZETIMIBE 10 MG PO TABS: 10.0000 mg | ORAL_TABLET | Freq: Every day | ORAL | 3 refills | Status: AC

## 2024-04-11 MED ORDER — TRELEGY ELLIPTA 100-62.5-25 MCG/ACT IN AEPB: INHALATION_SPRAY | RESPIRATORY_TRACT | 5 refills | Status: DC

## 2024-04-11 MED ORDER — LANSOPRAZOLE 15 MG PO CPDR: 15.0000 mg | DELAYED_RELEASE_CAPSULE | ORAL | 3 refills | Status: AC | PRN

## 2024-04-11 MED ORDER — ATORVASTATIN CALCIUM 80 MG PO TABS: 80.0000 mg | ORAL_TABLET | Freq: Every day | ORAL | 3 refills | Status: AC

## 2024-04-11 NOTE — Assessment & Plan Note (Signed)
Xray for evaluation.

## 2024-04-11 NOTE — Assessment & Plan Note (Signed)
 LDL at goal. Continue Lipitor and Zetia .

## 2024-04-11 NOTE — Assessment & Plan Note (Signed)
 Stable. Continue Lasix  and Valsartan .

## 2024-04-11 NOTE — Assessment & Plan Note (Signed)
 At goal. Continue Farxiga .

## 2024-04-11 NOTE — Assessment & Plan Note (Signed)
 Xray for further eval.

## 2024-04-11 NOTE — Progress Notes (Signed)
 Subjective:  Patient ID: Jerome Sanchez Husband, male    DOB: Mar 06, 1951  Age: 73 y.o. MRN: [id number]  CC:   Chief Complaint  Patient presents with   Diabetes   Hypertension   COPD    HPI:  73 year old male with the below mentioned medical problems presents for follow-up.  Patient states that overall he is doing well.  No chest pain.  Shortness of breath at baseline.  Patient is exercising regularly.  Needs refill on Prevacid .  Hypertension stable on Lasix  and valsartan .  Renal function stable.  Patient remains on Lasix , valsartan , and Farxiga.  Urine ACR was mildly increased from prior.  Current value 81.  Will discuss [place].   Patient reports ongoing left hip pain and decreased range of motion. ? Back vs hip.  He has had some prior back issues but none recently.  A1c at goal on Farxiga.  Patient Active Problem List   Diagnosis Date Noted   Low back pain 04/11/2024   Left hip pain 04/11/2024   Type 2 diabetes mellitus with complication (HCC) 10/12/2023   Aortic stenosis 02/04/2023   (HFpEF) heart failure with preserved ejection fraction (HCC) 02/04/2023   OSA (obstructive sleep apnea) 01/13/2023   Lesion of right native kidney 09/09/2022   Stage 3a chronic kidney disease (HCC) 07/15/2022   CAD (coronary artery disease) 07/15/2022   Former smoker 03/04/2022   Barrett's esophagus 01/26/2020   COPD (chronic obstructive pulmonary disease) (HCC) 07/27/2018   Mixed hyperlipidemia 08/05/2012   Essential hypertension, benign 10/26/2008    Social Hx   Social History   Socioeconomic History   Marital status: Married    Spouse name: Not on file   Number of children: Not on file   Years of education: Not on file   Highest education level: Bachelor's degree (e.g., BA, AB, BS)  Occupational History   Not on file  Tobacco Use   Smoking status: Former    Current packs/day: 0.00    Average packs/day: 1.3 packs/day for 51.0 years (63.8 ttl pk-yrs)    Types: Cigarettes     Start date: 60    Quit date: 2018    Years since quitting: 7.6   Smokeless tobacco: Never  Vaping Use   Vaping status: Never Used  Substance and Sexual Activity   Alcohol use: Yes    Alcohol/week: 6.0 standard drinks of alcohol    Types: 6 Glasses of wine per week    Comment: 1-2 glasses per night   Drug use: No   Sexual activity: Yes    Birth control/protection: None  Other Topics Concern   Not on file  Social History Narrative   Not on file   Social Drivers of Health   Financial Resource Strain: Low Risk  (04/07/2024)   Overall Financial Resource Strain (CARDIA)    Difficulty of Paying Living Expenses: Not hard at all  Food Insecurity: No Food Insecurity (04/07/2024)   Hunger Vital Sign    Worried About Running Out of Food in the Last Year: Never true    Ran Out of Food in the Last Year: Never true  Transportation Needs: No Transportation Needs (04/07/2024)   PRAPARE - Administrator, Civil Service (Medical): No    Lack of Transportation (Non-Medical): No  Physical Activity: Sufficiently Active (04/07/2024)   Exercise Vital Sign    Days of Exercise per Week: 4 days    Minutes of Exercise per Session: 40 min  Stress: No Stress Concern Present (04/07/2024)  [employer] of Occupational Health - Occupational Stress Questionnaire    Feeling of Stress: Not at all  Social Connections: Socially Isolated (04/07/2024)   Social Connection and Isolation Panel    Frequency of Communication with Friends and Family: Once a week    Frequency of Social Gatherings with Friends and Family: Once a week    Attends Religious Services: Never    Diplomatic Services operational officer: No    Attends Engineer, structural: Not on file    Marital Status: Married    Review of Systems Per HPI  Objective:  BP 117/71   Pulse 75   Temp (!) 97.3 F (36.3 C)   Ht 5' 7 (1.702 m)   Wt 216 lb (98 kg)   SpO2 94%   BMI 33.83 kg/m      04/11/2024   11:03 AM 02/25/2024     1:52 PM 10/14/2023    3:15 PM  BP/Weight  Systolic BP 117    Diastolic BP 71    Wt. (Lbs) 216 221.78 233.03  BMI 33.83 kg/m2 34.74 kg/m2 36.5 kg/m2    Physical Exam Constitutional:      General: He is not in acute distress.    Appearance: Normal appearance.  HENT:     Head: Normocephalic and atraumatic.  Cardiovascular:     Rate and Rhythm: Normal rate and regular rhythm.  Pulmonary:     Effort: Pulmonary effort is normal.     Breath sounds: Normal breath sounds.  Musculoskeletal:     Comments: Patient will pain upon internal rotation of left hip.  Neurological:     Mental Status: He is alert.  Psychiatric:        Mood and Affect: Mood normal.        Behavior: Behavior normal.     Lab Results  Component Value Date   WBC 8.2 10/02/2023   HGB 16.5 10/02/2023   HCT 49.0 10/02/2023   PLT 217 10/02/2023   GLUCOSE 109 (H) 03/31/2024   CHOL 146 03/31/2024   TRIG 71 03/31/2024   HDL 74 03/31/2024   LDLCALC 58 03/31/2024   ALT 31 03/31/2024   AST 40 03/31/2024   NA 141 03/31/2024   K 4.6 03/31/2024   CL 102 03/31/2024   CREATININE 1.43 (H) 03/31/2024   BUN 25 03/31/2024   CO2 22 03/31/2024   PSA 0.66 06/27/2014   HGBA1C 6.4 (H) 03/31/2024     Assessment & Plan:  Essential hypertension, benign Assessment & Plan: Stable. Continue Lasix  and Valsartan .   Gastroesophageal reflux disease without esophagitis -     Lansoprazole ; Take 1 capsule (15 mg total) by mouth as needed.  Dispense: 100 capsule; Refill: 3  Low back pain, unspecified back pain laterality, unspecified chronicity, unspecified whether sciatica present Assessment & Plan: Xray for further eval.  Orders: -     DG Lumbar Spine Complete  Left hip pain Assessment & Plan: Xray for evaluation.   Orders: -     DG HIP UNILAT W OR W/O PELVIS 2-3 VIEWS LEFT  Mixed hyperlipidemia Assessment & Plan: LDL at goal. Continue Lipitor and Zetia .   Orders: -     Atorvastatin  Calcium ; Take 1 tablet (80  mg total) by mouth daily.  Dispense: 100 tablet; Refill: 3  Coronary artery disease involving native coronary artery of native heart without angina pectoris Assessment & Plan: Sees cardiology. Currently asymptomatic. Lipids at goal (LDL should be at least below 70). Continue current medications.  Chronic obstructive pulmonary disease, unspecified COPD type (HCC) Assessment & Plan: Stable.    Type 2 diabetes mellitus with complication [facility]) Assessment & Plan: At goal. Continue Farxiga.   Stage 3a chronic kidney disease (HCC) Assessment & Plan: Stable. Continue current medications. Discussed Leonore. He wants to wait.   Other orders -     Furosemide ; Take 1 tablet (40 mg total) by mouth daily.  Dispense: 90 tablet; Refill: 3 -     Ezetimibe ; Take 1 tablet (10 mg total) by mouth daily.  Dispense: 100 tablet; Refill: 3 -     Trelegy Ellipta ; INHALE 1 PUFF INTO LUNGS ONCE DAILY  Dispense: 60 each; Refill: 5    Follow-up:  6 months  Peri Kreft Bluford DO [facility]

## 2024-04-11 NOTE — Assessment & Plan Note (Signed)
 Stable

## 2024-04-11 NOTE — Patient Instructions (Signed)
 Xrays at the hospital.  Follow up in 6 months.

## 2024-04-11 NOTE — Assessment & Plan Note (Signed)
 Sees cardiology. Currently asymptomatic. Lipids at goal (LDL should be at least below 70). Continue current medications.

## 2024-04-11 NOTE — Assessment & Plan Note (Addendum)
 Stable. Continue current medications. Discussed Jerome Sanchez. He wants to wait.

## 2024-04-12 ENCOUNTER — Ambulatory Visit: Payer: Self-pay | Admitting: Family Medicine

## 2024-04-12 ENCOUNTER — Other Ambulatory Visit: Payer: Self-pay

## 2024-04-12 DIAGNOSIS — M169 Osteoarthritis of hip, unspecified: Secondary | ICD-10-CM

## 2024-04-22 ENCOUNTER — Ambulatory Visit
Admission: EM | Admit: 2024-04-22 | Discharge: 2024-04-22 | Disposition: A | Discharge status: Home / Self Care | Attending: Family Medicine | Admitting: Family Medicine

## 2024-04-22 DIAGNOSIS — L03116 Cellulitis of left lower limb: Secondary | ICD-10-CM

## 2024-04-22 DIAGNOSIS — B351 Tinea unguium: Secondary | ICD-10-CM | POA: Diagnosis not present

## 2024-04-22 DIAGNOSIS — L600 Ingrowing nail: Secondary | ICD-10-CM

## 2024-04-22 MED ORDER — DOXYCYCLINE HYCLATE 100 MG PO CAPS: 100.0000 mg | ORAL_CAPSULE | Freq: Two times a day (BID) | ORAL | 0 refills | Status: DC

## 2024-04-22 MED ORDER — TERBINAFINE HCL 1 % EX CREA: 1.0000 | TOPICAL_CREAM | Freq: Two times a day (BID) | CUTANEOUS | 0 refills | Status: DC

## 2024-04-22 NOTE — ED Provider Notes (Signed)
 [facility]    CSN: [id number] Arrival date & time: 04/22/24  1656      History   Chief Complaint No chief complaint on file.   HPI Jerome KIENER is a 73 y.o. male.   Patient presenting today with 1 day history of pain, redness, swelling to the left great toe that is now streaking into the top of the foot.  The area is painful, warm to the touch.  Denies known injury to the area, decreased range of motion, numbness, tingling, fevers.  So far not trying anything over-the-counter for symptoms.    Past Medical History:  Diagnosis Date   Allergy    Arthritis    Asthma    Cardiac arrhythmia    Chronic kidney disease    COPD (chronic obstructive pulmonary disease) (HCC)    Essential hypertension, benign    GERD (gastroesophageal reflux disease)    Impaired glucose tolerance    Injury of right rotator cuff    Lumbar disc disease    Mixed hyperlipidemia    OSA (obstructive sleep apnea)    Sleep apnea    Type 2 diabetes mellitus (HCC)    Vitamin D deficiency     Patient Active Problem List   Diagnosis Date Noted   Low back pain 04/11/2024   Left hip pain 04/11/2024   Type 2 diabetes mellitus with complication (HCC) 10/12/2023   Aortic stenosis 02/04/2023   (HFpEF) heart failure with preserved ejection fraction (HCC) 02/04/2023   OSA (obstructive sleep apnea) 01/13/2023   Lesion of right native kidney 09/09/2022   Stage 3a chronic kidney disease (HCC) 07/15/2022   CAD (coronary artery disease) 07/15/2022   Former smoker 03/04/2022   Barrett's esophagus 01/26/2020   COPD (chronic obstructive pulmonary disease) (HCC) 07/27/2018   Mixed hyperlipidemia 08/05/2012   Essential hypertension, benign 10/26/2008    Past Surgical History:  Procedure Laterality Date   BIOPSY  01/19/2020   Procedure: BIOPSY;  Surgeon: Golda Claudis PENNER, MD;  Location: [facility];  Service: Endoscopy;;   COLONOSCOPY N/A 01/19/2020   Procedure: COLONOSCOPY;  Surgeon: Golda Claudis PENNER, MD;  Location: [facility];  Service: Endoscopy;  Laterality: N/A;   ESOPHAGOGASTRODUODENOSCOPY N/A 01/19/2020   Procedure: ESOPHAGOGASTRODUODENOSCOPY (EGD);  Surgeon: Golda Claudis PENNER, MD;  Location: [facility];  Service: Endoscopy;  Laterality: N/A;  1200   POLYPECTOMY  01/19/2020   Procedure: POLYPECTOMY;  Surgeon: Golda Claudis PENNER, MD;  Location: [facility];  Service: Endoscopy;;       Home Medications    Prior to Admission medications   Medication Sig Start Date End Date Taking? Authorizing Provider  doxycycline  (VIBRAMYCIN ) 100 MG capsule Take 1 capsule (100 mg total) by mouth 2 (two) times daily. 04/22/24  Yes Stuart Vernell Norris, PA-C  terbinafine  (LAMISIL  AT) 1 % cream Apply 1 Application topically 2 (two) times daily. Apply to left big toenail twice daily 04/22/24  Yes Stuart Vernell Norris, PA-C  albuterol  (VENTOLIN  HFA) 108 (90 Base) MCG/ACT inhaler INHALE 1 TO 2 PUFFS BY MOUTH EVERY 6 HOURS AS NEEDED FOR WHEEZING FOR SHORTNESS OF BREATH 03/23/23   Cook, Jayce G, DO  atorvastatin  (LIPITOR) 80 MG tablet Take 1 tablet (80 mg total) by mouth daily. 04/11/24   Cook, Jayce G, DO  dapagliflozin propanediol (FARXIGA) 5 MG TABS tablet Take 5 mg by mouth daily.    [provider]  ezetimibe  (ZETIA ) 10 MG tablet Take 1 tablet (10 mg total) by mouth daily.  04/11/24   Cook, Jayce G, DO  Fluticasone-Umeclidin-Vilant (TRELEGY ELLIPTA ) 100-62.5-25 MCG/ACT AEPB INHALE 1 PUFF INTO LUNGS ONCE DAILY 04/11/24   Cook, Jayce G, DO  furosemide  (LASIX ) 40 MG tablet Take 1 tablet (40 mg total) by mouth daily. 04/11/24   Cook, Jayce G, DO  lansoprazole  (PREVACID ) 15 MG capsule Take 1 capsule (15 mg total) by mouth as needed. 04/11/24   Cook, Jayce G, DO  OVER THE COUNTER MEDICATION Areds ( omega 3, lutein, zeaxanthin) take one a day    [provider]  valsartan  (DIOVAN ) 80 MG tablet Take 1 tablet (80 mg total) by mouth daily. 10/12/23   Bluford Jacqulyn MATSU, DO    Family  History Family History  Problem Relation Age of Onset   Diabetes type II Father    Hypertension Sister     Social History Social History   Tobacco Use   Smoking status: Former    Current packs/day: 0.00    Average packs/day: 1.3 packs/day for 51.0 years (63.8 ttl pk-yrs)    Types: Cigarettes    Start date: 65    Quit date: 2018    Years since quitting: 7.6   Smokeless tobacco: Never  Vaping Use   Vaping status: Never Used  Substance Use Topics   Alcohol use: Yes    Alcohol/week: 6.0 standard drinks of alcohol    Types: 6 Glasses of wine per week    Comment: 1-2 glasses per night   Drug use: No     Allergies   Penicillins   Review of Systems Review of Systems Per HPI  Physical Exam Triage Vital Signs ED Triage Vitals  Encounter Vitals Group     BP 04/22/24 1720 138/66     Girls Systolic BP Percentile --      Girls Diastolic BP Percentile --      Boys Systolic BP Percentile --      Boys Diastolic BP Percentile --      Pulse Rate 04/22/24 1720 96     Resp 04/22/24 1720 (!) 22     Temp 04/22/24 1720 98.2 F (36.8 C)     Temp Source 04/22/24 1720 Oral     SpO2 04/22/24 1720 92 %     Weight --      Height --      Head Circumference --      Peak Flow --      Pain Score 04/22/24 1724 6     Pain Loc --      Pain Education --      Exclude from Growth Chart --    No data found.  Updated Vital Signs BP 138/66 (BP Location: Right Arm)   Pulse 96   Temp 98.2 F (36.8 C) (Oral)   Resp (!) 22   SpO2 92%   Visual Acuity Right Eye Distance:   Left Eye Distance:   Bilateral Distance:    Right Eye Near:   Left Eye Near:    Bilateral Near:     Physical Exam Vitals and nursing note reviewed.  Constitutional:      Appearance: Normal appearance.  HENT:     Head: Atraumatic.  Eyes:     Extraocular Movements: Extraocular movements intact.     Conjunctiva/sclera: Conjunctivae normal.  Cardiovascular:     Rate and Rhythm: Normal rate.  Pulmonary:      Effort: Pulmonary effort is normal.  Musculoskeletal:        General: Normal range of motion.  Cervical back: Normal range of motion and neck supple.  Skin:    General: Skin is warm and dry.     Findings: Erythema present.     Comments: Significantly thickened and discolored left great toenail with both nail edges curling inward into the skin.  Left great toe erythematous, edematous with streaking into the dorsal foot downward from this area.  Tender to palpation, warm to the touch  Neurological:     Mental Status: He is oriented to person, place, and time.     Comments: Left lower extremity neurovascularly intact  Psychiatric:        Mood and Affect: Mood normal.        Thought Content: Thought content normal.        Judgment: Judgment normal.      UC Treatments / Results  Labs (all labs ordered are listed, but only abnormal results are displayed) Labs Reviewed - No data to display  EKG   Radiology No results found.  Procedures Procedures (including critical care time)  Medications Ordered in UC Medications - No data to display  Initial Impression / Assessment and Plan / UC Course  I have reviewed the triage vital signs and the nursing notes.  Pertinent labs & imaging results that were available during my care of the patient were reviewed by me and considered in my medical decision making (see chart for details).     Suspect cellulitis of the left great toe secondary to onychomycosis and ingrowing toenail.  Lamisil  twice daily for the onychomycosis, doxycycline , Epsom salt soaks, elevation for the cellulitis.  Return for worsening symptoms.  Final Clinical Impressions(s) / UC Diagnoses   Final diagnoses:  Cellulitis of left foot  Ingrown left big toenail  Onychomycosis     Discharge Instructions      I have sent in antibiotics to treat what I suspect to be a skin infection likely from ingrown toenail.  You may also do warm Epsom salt soaks, elevate at rest  to help with the swelling.  I have also sent in some terbinafine  cream to treat the toenail fungus on this toe.  Follow-up for worsening or unresolving symptoms    ED Prescriptions     Medication Sig Dispense Auth. Provider   doxycycline  (VIBRAMYCIN ) 100 MG capsule Take 1 capsule (100 mg total) by mouth 2 (two) times daily. 14 capsule Stuart Vernell Norris, PA-C   terbinafine  (LAMISIL  AT) 1 % cream Apply 1 Application topically 2 (two) times daily. Apply to left big toenail twice daily 80 g Stuart Vernell Norris, [place]      PDMP not reviewed this encounter.   Stuart Vernell Norris, [place] 04/22/24 [phone]

## 2024-04-22 NOTE — ED Triage Notes (Signed)
 Pt reports he has a left great toe infection since this morning. States first left toe is painful and swollen.  Pt reports 2 days ago he was laying title down in his backroom bare foot.

## 2024-04-22 NOTE — Discharge Instructions (Signed)
 I have sent in antibiotics to treat what I suspect to be a skin infection likely from ingrown toenail.  You may also do warm Epsom salt soaks, elevate at rest to help with the swelling.  I have also sent in some terbinafine  cream to treat the toenail fungus on this toe.  Follow-up for worsening or unresolving symptoms

## 2024-04-25 ENCOUNTER — Encounter: Payer: Self-pay | Admitting: Family Medicine

## 2024-04-26 ENCOUNTER — Encounter: Payer: Self-pay | Admitting: Family Medicine

## 2024-04-26 ENCOUNTER — Ambulatory Visit: Admitting: Family Medicine

## 2024-04-26 VITALS — BP 113/76 | Ht 67.0 in | Wt 217.0 lb

## 2024-04-26 DIAGNOSIS — Z23 Encounter for immunization: Secondary | ICD-10-CM

## 2024-04-26 DIAGNOSIS — L03039 Cellulitis of unspecified toe: Secondary | ICD-10-CM | POA: Diagnosis not present

## 2024-04-26 NOTE — Patient Instructions (Signed)
 Finish the antibiotic.  Message with concerns.  Awaiting culture.

## 2024-04-27 DIAGNOSIS — L03039 Cellulitis of unspecified toe: Secondary | ICD-10-CM | POA: Insufficient documentation

## 2024-04-27 LAB — WOUND CULTURE

## 2024-04-27 NOTE — Assessment & Plan Note (Signed)
 Incision and drainage performed today.  Complete course of doxycycline .  Culture sent.

## 2024-04-27 NOTE — Progress Notes (Signed)
 Subjective:  Patient ID: Jerome Sanchez Husband, male    DOB: 1950/09/30  Age: 73 y.o. MRN: [id number]  CC:   Chief Complaint  Patient presents with   Toe Pain    Started Friday, went to urgent care, denies pain today    HPI:  73 year old male presents for evaluation of the above.  Started on Friday.  Symptoms: Pain, redness, swelling to the left great toe.  Patient was evaluated urgent care and was treated with doxycycline  and terbinafine .  Patient presents today for reevaluation.  Pain has improved.  However, now he has an area of purulence around the nailbed.  Erythema remains.  No fever.  Patient Active Problem List   Diagnosis Date Noted   Paronychia of great toe 04/27/2024   Type 2 diabetes mellitus with complication (HCC) 10/12/2023   Aortic stenosis 02/04/2023   (HFpEF) heart failure with preserved ejection fraction (HCC) 02/04/2023   OSA (obstructive sleep apnea) 01/13/2023   Lesion of right native kidney 09/09/2022   Stage 3a chronic kidney disease (HCC) 07/15/2022   CAD (coronary artery disease) 07/15/2022   Former smoker 03/04/2022   Barrett's esophagus 01/26/2020   COPD (chronic obstructive pulmonary disease) (HCC) 07/27/2018   Mixed hyperlipidemia 08/05/2012   Essential hypertension, benign 10/26/2008    Social Hx   Social History   Socioeconomic History   Marital status: Married    Spouse name: Not on file   Number of children: Not on file   Years of education: Not on file   Highest education level: Bachelor's degree (e.g., BA, AB, BS)  Occupational History   Not on file  Tobacco Use   Smoking status: Former    Current packs/day: 0.00    Average packs/day: 1.3 packs/day for 51.0 years (63.8 ttl pk-yrs)    Types: Cigarettes    Start date: 11    Quit date: 2018    Years since quitting: 7.6   Smokeless tobacco: Never  Vaping Use   Vaping status: Never Used  Substance and Sexual Activity   Alcohol use: Yes    Alcohol/week: 6.0 standard drinks of alcohol     Types: 6 Glasses of wine per week    Comment: 1-2 glasses per night   Drug use: No   Sexual activity: Yes    Birth control/protection: None  Other Topics Concern   Not on file  Social History Narrative   Not on file   Social Drivers of Health   Financial Resource Strain: Low Risk  (04/07/2024)   Overall Financial Resource Strain (CARDIA)    Difficulty of Paying Living Expenses: Not hard at all  Food Insecurity: No Food Insecurity (04/07/2024)   Hunger Vital Sign    Worried About Running Out of Food in the Last Year: Never true    Ran Out of Food in the Last Year: Never true  Transportation Needs: No Transportation Needs (04/07/2024)   PRAPARE - Administrator, Civil Service (Medical): No    Lack of Transportation (Non-Medical): No  Physical Activity: Sufficiently Active (04/07/2024)   Exercise Vital Sign    Days of Exercise per Week: 4 days    Minutes of Exercise per Session: 40 min  Stress: No Stress Concern Present (04/07/2024)   [employer] of Occupational Health - Occupational Stress Questionnaire    Feeling of Stress: Not at all  Social Connections: Socially Isolated (04/07/2024)   Social Connection and Isolation Panel    Frequency of Communication with Friends and Family: Once  a week    Frequency of Social Gatherings with Friends and Family: Once a week    Attends Religious Services: Never    Database administrator or Organizations: No    Attends Engineer, structural: Not on file    Marital Status: Married    Review of Systems Per HPI  Objective:  BP 113/76   Ht 5' 7 (1.702 m)   Wt 217 lb (98.4 kg)   BMI 33.99 kg/m      04/26/2024    3:59 PM 04/22/2024    5:20 PM 04/11/2024   11:03 AM  BP/Weight  Systolic BP 113 138 117  Diastolic BP 76 66 71  Wt. (Lbs) 217  216  BMI 33.99 kg/m2  33.83 kg/m2    Physical Exam Vitals and nursing note reviewed.  Constitutional:      General: He is not in acute distress.    Appearance: Normal  appearance.  HENT:     Head: Normocephalic and atraumatic.  Feet:     Comments: Left great toe with paronychia.  Erythema noted which extends proximally. Neurological:     Mental Status: He is alert.     Lab Results  Component Value Date   WBC 8.2 10/02/2023   HGB 16.5 10/02/2023   HCT 49.0 10/02/2023   PLT 217 10/02/2023   GLUCOSE 109 (H) 03/31/2024   CHOL 146 03/31/2024   TRIG 71 03/31/2024   HDL 74 03/31/2024   LDLCALC 58 03/31/2024   ALT 31 03/31/2024   AST 40 03/31/2024   NA 141 03/31/2024   K 4.6 03/31/2024   CL 102 03/31/2024   CREATININE 1.43 (H) 03/31/2024   BUN 25 03/31/2024   CO2 22 03/31/2024   PSA 0.66 06/27/2014   HGBA1C 6.4 (H) 03/31/2024   Procedure: Incision and drainage of paronychia Area was cleansed with Betadine. Topical PainEase was used for anesthesia. Stab incision made to localized area of purulence with an 11 blade. Purulent fluid was exuded from the wound.  Culture was obtained. No bleeding.  No complications.  Patient tolerated procedure well.  Assessment & Plan:  Paronychia of great toe Assessment & Plan: Incision and drainage performed today.  Complete course of doxycycline .  Culture sent.  Orders: -     WOUND CULTURE  Need for influenza vaccination -     Flu vaccine HIGH DOSE PF(Fluzone Trivalent)    Follow-up:  Return if symptoms worsen or fail to improve.  Jacqulyn Ahle DO [facility]

## 2024-04-30 LAB — SPECIMEN STATUS REPORT

## 2024-05-26 ENCOUNTER — Ambulatory Visit: Admitting: Internal Medicine

## 2024-05-26 ENCOUNTER — Encounter: Payer: Self-pay | Admitting: Internal Medicine

## 2024-05-26 VITALS — BP 117/75 | HR 81 | Ht 67.0 in | Wt 214.0 lb

## 2024-05-26 DIAGNOSIS — J449 Chronic obstructive pulmonary disease, unspecified: Secondary | ICD-10-CM

## 2024-05-26 DIAGNOSIS — Z87891 Personal history of nicotine dependence: Secondary | ICD-10-CM

## 2024-05-26 MED ORDER — BREZTRI AEROSPHERE 160-9-4.8 MCG/ACT IN AERO: 2.0000 | INHALATION_SPRAY | Freq: Two times a day (BID) | RESPIRATORY_TRACT | Status: AC

## 2024-05-26 MED ORDER — BREZTRI AEROSPHERE 160-9-4.8 MCG/ACT IN AERO
INHALATION_SPRAY | RESPIRATORY_TRACT | 11 refills | Status: AC
Start: 2024-05-26 — End: ?

## 2024-05-26 NOTE — Progress Notes (Signed)
 Jerome Sanchez Husband, male    DOB: March 21, 1951    MRN: [id number]   Brief patient profile:  73   yowm quit smoking 2018 referred to pulmonary clinic in [place]  05/26/2024 by Dr Bluford  for copd eval.    CT 07/06/23 Moderate centrilobular emphysema  PFT's  09/22/23   FEV1 1.85 (62 % ) ratio 0.55  p 0 % improvement from saba p Trelegy  prior to study with DLCO  12.8 (52%)   and FV curve mildly concave   NP Rec /18/25  Refer to pulmonary rehab> completed July 2025   Continue on Trelegy 1 puff  daily, rinse after use.  Albuterol  inhaler As needed   Mucinex DM As needed  cough/congestion > no better  Try Allegra 180mg  daily As needed  > no better throat congestion    History of Present Illness  05/26/2024  Pulmonary/ 1st office eval/ [facility] / [place] [facility]  Chief Complaint  Patient presents with   Establish Care    Copd   Dyspnea:  treadmill x 20 min x 2.5 pm x 1% x 3 x weeklly  Cough: throat congestion x 2 y getting worse only present daytime  Sleep: bed is flat/ one pillow SABA use: couple of times a week  02: none  LDSCT:scheduled for 05/27/24     No obvious day to day or daytime pattern/variability or assoc excess/ purulent sputum or mucus plugs or hemoptysis or cp or chest tightness, subjective wheeze or overt sinus or hb symptoms.    Also denies any obvious fluctuation of symptoms with weather or environmental changes or other aggravating or alleviating factors except as outlined above   No unusual exposure hx or h/o childhood pna/ asthma or knowledge of premature birth.  Current Allergies, Complete Past Medical History, Past Surgical History, Family History, and Social History were reviewed in [employer] record.  ROS  The following are not active complaints unless bolded Hoarseness, sore throat/globus , dysphagia, dental problems, itching, sneezing,  nasal congestion or discharge of excess mucus or purulent secretions, ear ache,   fever, chills, sweats,  unintended wt loss or wt gain, classically pleuritic or exertional cp,  orthopnea pnd or arm/hand swelling  or leg swelling, presyncope, palpitations, abdominal pain, anorexia, nausea, vomiting, diarrhea  or change in bowel habits or change in bladder habits, change in stools or change in urine, dysuria, hematuria,  rash, arthralgias, visual complaints, headache, numbness, weakness or ataxia or problems with walking or coordination,  change in mood or  memory.            Outpatient Medications Prior to Visit  Medication Sig Dispense Refill   albuterol  (VENTOLIN  HFA) 108 (90 Base) MCG/ACT inhaler INHALE 1 TO 2 PUFFS BY MOUTH EVERY 6 HOURS AS NEEDED FOR WHEEZING FOR SHORTNESS OF BREATH 9 g 0   atorvastatin  (LIPITOR) 80 MG tablet Take 1 tablet (80 mg total) by mouth daily. 100 tablet 3   dapagliflozin propanediol (FARXIGA) 5 MG TABS tablet Take 5 mg by mouth daily.     ezetimibe  (ZETIA ) 10 MG tablet Take 1 tablet (10 mg total) by mouth daily. 100 tablet 3   Fluticasone-Umeclidin-Vilant (TRELEGY ELLIPTA ) 100-62.5-25 MCG/ACT AEPB INHALE 1 PUFF INTO LUNGS ONCE DAILY 60 each 5   furosemide  (LASIX ) 40 MG tablet Take 1 tablet (40 mg total) by mouth daily. 90 tablet 3   lansoprazole  (PREVACID ) 15 MG capsule Take 1 capsule (15 mg total) by mouth as needed. 100 capsule 3  OVER THE COUNTER MEDICATION Areds ( omega 3, lutein, zeaxanthin) take one a day     valsartan  (DIOVAN ) 80 MG tablet Take 1 tablet (80 mg total) by mouth daily. 100 tablet 3   doxycycline  (VIBRAMYCIN ) 100 MG capsule Take 1 capsule (100 mg total) by mouth 2 (two) times daily. 14 capsule 0   terbinafine  (LAMISIL  AT) 1 % cream Apply 1 Application topically 2 (two) times daily. Apply to left big toenail twice daily 80 g 0   No facility-administered medications prior to visit.    Past Medical History:  Diagnosis Date   Allergy    Arthritis    Asthma    Cardiac arrhythmia    Chronic kidney disease    COPD (chronic obstructive pulmonary  disease) (HCC)    Essential hypertension, benign    GERD (gastroesophageal reflux disease)    Impaired glucose tolerance    Injury of right rotator cuff    Lumbar disc disease    Mixed hyperlipidemia    OSA (obstructive sleep apnea)    Sleep apnea    Type 2 diabetes mellitus (HCC)    Vitamin D deficiency       Objective:     BP 117/75   Pulse 81   Ht 5' 7 (1.702 m)   Wt 214 lb (97.1 kg)   SpO2 92% Comment: ra  BMI 33.52 kg/m   SpO2: 92 % (ra) pleasant  slt hoarse amb wm nad   HEENT : Oropharynx  clear   Nasal turbinates mild non-specific turbinate edema   NECK :  without  apparent JVD/ palpable Nodes/TM    LUNGS: no acc muscle use,  Mild barrel  contour chest wall with bilateral  Distant bs s audible wheeze and  without cough on insp or exp maneuvers  and mild  Hyperresonant  to  percussion bilaterally     CV:  RRR  no s3 or murmur or increase in P2, and no edema   ABD:  soft and nontender   MS:  Nl gait/ ext warm without deformities Or obvious joint restrictions  calf tenderness, cyanosis or clubbing     SKIN: warm and dry without lesions    NEURO:  alert, approp, nl sensorium with  no motor or cerebellar deficits apparent.        Assessment   Assessment & Plan COPD mixed type (HCC) COPD GOLD 2/ group E - quit smoking 2018  - PFT's  09/22/23   FEV1 1.85 (62 % ) ratio 0.55  p 0 % improvement from saba p Trelegy  prior to study with DLCO  12.8 (52%)   and FV curve mildly concave  - 05/26/2024  After extensive coaching inhaler device,  effectiveness =    80% hfa > change to breztri to see if throat congestion improves    Group E in terms of symptoms/risk so  laba/lama/ICS  therefore appropriate rx at this point  >>>  breztri   and approp SABA prn.  Re SABA :  I spent extra time with pt today reviewing appropriate use of albuterol  for prn use on exertion with the following points: 1) saba is for relief of sob that does not improve by walking a slower pace or  resting but rather if the pt does not improve after trying this first. 2) If the pt is convinced, as many are, that saba helps recover from activity faster then it's easy to tell if this is the case by re-challenging : ie stop,  take the inhaler, then p 5 minutes try the exact same activity (intensity of workload) that just caused the symptoms and see if they are substantially diminished or not after saba 3) if there is an activity that reproducibly causes the symptoms, try the saba 15 min before the activity on alternate days   If in fact the saba really does help, then fine to continue to use it prn but advised may need to look closer at the maintenance regimen (trelegy vs Breztri)  being used to achieve better control of airways disease with exertion.    Former cigarette smoker Low-dose CT lung cancer screening is recommended for patients who are 47-47 years of age with a 20+ pack-year history of smoking and who are currently smoking or quit <=15 years ago. No coughing up blood  No unintentional weight loss of > 15 pounds in the last 6 months - pt is eligible for scanning yearly until 2028 > planned for 05/27/24    Discussed in detail all the  indications, usual  risks and alternatives  relative to the benefits with patient who agrees to proceed with w/u as outlined.            Each maintenance medication was reviewed in detail including emphasizing most importantly the difference between maintenance and prns and under what circumstances the prns are to be triggered using an action plan format where appropriate.  Total time for H and P, chart review, counseling, reviewing hfa /dpi  device(s) and generating customized AVS unique to this office visit / same day charting = 42 min with pt new to me          AVS  Patient Instructions  Stop trelegy   Start Breztri Take 2 puffs first thing in am and then another 2 puffs about 12 hours later.   Work on inhaler technique:  relax and gently  blow all the way out then take a nice smooth full deep breath back in, triggering the inhaler at same time you start breathing in.  Hold breath in for at least  5 seconds if you can. Blow out breztri  thru nose. Rinse and gargle with water when done.  If mouth or throat bother you at all,  try brushing teeth/gums/tongue with arm and hammer toothpaste/ make a slurry and gargle and spit out.       Use your albuterol  as a rescue medication to be used if you can't catch your breath by resting, slowing your pace,  or doing a relaxed purse lip breathing pattern.  - The less you use it, the better it will work when you need it. - Ok to use up to 2 puffs  every 4 hours if you must but call for  appointment if use goes up over your usual need - Don't leave home without it !!  (think of it like a spare tire or starter fluid for your car)    Please schedule a follow up visit in 6 months but call sooner if needed      Ozell America, MD 05/26/2024

## 2024-05-26 NOTE — Assessment & Plan Note (Addendum)
 Low-dose CT lung cancer screening is recommended for patients who are 39-74 years of age with a 20+ pack-year history of smoking and who are currently smoking or quit <=15 years ago. No coughing up blood  No unintentional weight loss of > 15 pounds in the last 6 months - pt is eligible for scanning yearly until 2028 > planned for 05/27/24    Discussed in detail all the  indications, usual  risks and alternatives  relative to the benefits with patient who agrees to proceed with w/u as outlined.            Each maintenance medication was reviewed in detail including emphasizing most importantly the difference between maintenance and prns and under what circumstances the prns are to be triggered using an action plan format where appropriate.  Total time for H and P, chart review, counseling, reviewing hfa /dpi  device(s) and generating customized AVS unique to this office visit / same day charting = 42 min with pt new to me

## 2024-05-26 NOTE — Patient Instructions (Addendum)
 Stop trelegy   Start Breztri Take 2 puffs first thing in am and then another 2 puffs about 12 hours later.   Work on inhaler technique:  relax and gently blow all the way out then take a nice smooth full deep breath back in, triggering the inhaler at same time you start breathing in.  Hold breath in for at least  5 seconds if you can. Blow out breztri  thru nose. Rinse and gargle with water when done.  If mouth or throat bother you at all,  try brushing teeth/gums/tongue with arm and hammer toothpaste/ make a slurry and gargle and spit out.       Use your albuterol  as a rescue medication to be used if you can't catch your breath by resting, slowing your pace,  or doing a relaxed purse lip breathing pattern.  - The less you use it, the better it will work when you need it. - Ok to use up to 2 puffs  every 4 hours if you must but call for  appointment if use goes up over your usual need - Don't leave home without it !!  (think of it like a spare tire or starter fluid for your car)    Please schedule a follow up visit in 6 months but call sooner if needed

## 2024-05-26 NOTE — Assessment & Plan Note (Addendum)
 COPD GOLD 2/ group E - quit smoking 2018  - PFT's  09/22/23   FEV1 1.85 (62 % ) ratio 0.55  p 0 % improvement from saba p Trelegy  prior to study with DLCO  12.8 (52%)   and FV curve mildly concave  - 05/26/2024  After extensive coaching inhaler device,  effectiveness =    80% hfa > change to breztri to see if throat congestion improves    Group E in terms of symptoms/risk so  laba/lama/ICS  therefore appropriate rx at this point  >>>  breztri   and approp SABA prn.  Re SABA :  I spent extra time with pt today reviewing appropriate use of albuterol  for prn use on exertion with the following points: 1) saba is for relief of sob that does not improve by walking a slower pace or resting but rather if the pt does not improve after trying this first. 2) If the pt is convinced, as many are, that saba helps recover from activity faster then it's easy to tell if this is the case by re-challenging : ie stop, take the inhaler, then p 5 minutes try the exact same activity (intensity of workload) that just caused the symptoms and see if they are substantially diminished or not after saba 3) if there is an activity that reproducibly causes the symptoms, try the saba 15 min before the activity on alternate days   If in fact the saba really does help, then fine to continue to use it prn but advised may need to look closer at the maintenance regimen (trelegy vs Breztri)  being used to achieve better control of airways disease with exertion.

## 2024-05-27 ENCOUNTER — Ambulatory Visit (HOSPITAL_COMMUNITY)
Admission: RE | Admit: 2024-05-27 | Discharge: 2024-05-27 | Disposition: A | Discharge status: Home / Self Care | Source: Ambulatory Visit | Attending: Acute Care | Admitting: Acute Care

## 2024-05-27 DIAGNOSIS — Z122 Encounter for screening for malignant neoplasm of respiratory organs: Secondary | ICD-10-CM | POA: Diagnosis not present

## 2024-05-27 DIAGNOSIS — Z87891 Personal history of nicotine dependence: Secondary | ICD-10-CM | POA: Diagnosis not present

## 2024-05-27 DIAGNOSIS — F17210 Nicotine dependence, cigarettes, uncomplicated: Secondary | ICD-10-CM | POA: Diagnosis not present

## 2024-05-31 ENCOUNTER — Other Ambulatory Visit: Payer: Self-pay

## 2024-05-31 DIAGNOSIS — F17210 Nicotine dependence, cigarettes, uncomplicated: Secondary | ICD-10-CM

## 2024-05-31 DIAGNOSIS — Z87891 Personal history of nicotine dependence: Secondary | ICD-10-CM

## 2024-05-31 DIAGNOSIS — Z122 Encounter for screening for malignant neoplasm of respiratory organs: Secondary | ICD-10-CM

## 2024-06-24 ENCOUNTER — Ambulatory Visit: Payer: PPO

## 2024-06-24 VITALS — Ht 67.0 in | Wt 214.0 lb

## 2024-06-24 DIAGNOSIS — Z0000 Encounter for general adult medical examination without abnormal findings: Secondary | ICD-10-CM | POA: Diagnosis not present

## 2024-06-24 NOTE — Progress Notes (Signed)
 Subjective:   Jerome Sanchez is a 73 y.o. male who presents for a Medicare Annual Wellness Visit.  I connected with  Jerome Sanchez on 06/24/24 by a audio enabled telemedicine application and verified that I am speaking with the correct person using two identifiers.  Patient Location: Home  Provider Location: Office/Clinic  Persons Participating in Visit: Patient.  I discussed the limitations of evaluation and management by telemedicine. The patient expressed understanding and agreed to proceed.  Vital Signs: Because this visit was a virtual/telehealth visit, some criteria may be missing or patient reported. Any vitals not documented were not able to be obtained and vitals that have been documented are patient reported.  Allergies (verified) Penicillins   History: Past Medical History:  Diagnosis Date   Allergy    Arthritis    Asthma    Cardiac arrhythmia    Chronic kidney disease    COPD (chronic obstructive pulmonary disease) (HCC)    Essential hypertension, benign    GERD (gastroesophageal reflux disease)    Impaired glucose tolerance    Injury of right rotator cuff    Lumbar disc disease    Mixed hyperlipidemia    OSA (obstructive sleep apnea)    Sleep apnea    Type 2 diabetes mellitus (HCC)    Vitamin D deficiency    Past Surgical History:  Procedure Laterality Date   BIOPSY  01/19/2020   Procedure: BIOPSY;  Surgeon: Golda Claudis PENNER, MD;  [facility];  Service: Endoscopy;;   COLONOSCOPY N/A 01/19/2020   Procedure: COLONOSCOPY;  Surgeon: Golda Claudis PENNER, MD;  [facility];  Service: Endoscopy;  Laterality: N/A;   ESOPHAGOGASTRODUODENOSCOPY N/A 01/19/2020   Procedure: ESOPHAGOGASTRODUODENOSCOPY (EGD);  Surgeon: Golda Claudis PENNER, MD;  [facility];  Service: Endoscopy;  Laterality: N/A;  1200   POLYPECTOMY  01/19/2020   Procedure: POLYPECTOMY;  Surgeon: Golda Claudis PENNER, MD;  [facility];  Service: Endoscopy;;    Family History  Problem Relation Age of Onset   Diabetes type II Father    Hypertension Sister    Arrhythmia Sister    Hypertension Sister    Social History   Occupational History   Not on file  Tobacco Use   Smoking status: Former    Current packs/day: 0.00    Average packs/day: 1.3 packs/day for 52.9 years (66.2 ttl pk-yrs)    Types: Cigarettes    Start date: 56    Quit date: 2018    Years since quitting: 7.8   Smokeless tobacco: Never  Vaping Use   Vaping status: Never Used  Substance and Sexual Activity   Alcohol use: Yes    Alcohol/week: 6.0 standard drinks of alcohol    Types: 6 Glasses of wine per week    Comment: 1-2 glasses per night   Drug use: No   Sexual activity: Yes    Birth control/protection: None   Tobacco Counseling Counseling given: Not Answered  SDOH Screenings   Food Insecurity: No Food Insecurity (06/20/2024)  Housing: Low Risk  (06/20/2024)  Transportation Needs: No Transportation Needs (06/20/2024)  Utilities: Not At Risk (06/24/2024)  Alcohol Screen: Low Risk  (06/20/2024)  Depression (PHQ2-9): Low Risk  (06/24/2024)  Financial Resource Strain: Low Risk  (06/20/2024)  Physical Activity: Insufficiently Active (06/20/2024)  Social Connections: Moderately Isolated (06/20/2024)  Stress: No Stress Concern Present (06/20/2024)  Tobacco Use: Medium Risk (06/24/2024)  Health Literacy: Adequate Health Literacy (06/24/2024)   Depression Screen  06/24/2024   11:06 AM 04/11/2024   11:04 AM 02/25/2024    2:02 PM 10/12/2023    9:14 AM 06/19/2023   10:11 AM 06/09/2023    2:43 PM 05/13/2023   10:48 AM  PHQ 2/9 Scores  PHQ - 2 Score 0 0 0 0 0 0 0  PHQ- 9 Score  0  1  1  0  0       Data saved with a previous flowsheet row definition      Goals Addressed             This Visit's Progress    Remain active and independent   On track      Visit info / Clinical Intake: Medicare Wellness Visit Type:: Subsequent Annual Wellness Visit Medicare  Wellness Visit Mode:: Telephone If telephone:: video declined If telephone or video:: vitals recorded from last visit Interpreter Needed?: No Pre-visit prep was completed: yes AWV questionnaire completed by patient prior to visit?: yes Date:: 06/20/24 Living arrangements:: lives with spouse/significant other Patient's Overall Health Status Rating: good Typical amount of pain: some Does pain affect daily life?: no Are you currently prescribed opioids?: no  Dietary Habits and Nutritional Risks How many meals a day?: 3 Eats fruit and vegetables daily?: yes Most meals are obtained by: preparing own meals Diabetic:: (!) yes Any non-healing wounds?: no How often do you check your BS?: 0 Would you like to be referred to a Nutritionist or for Diabetic Management? : no  Functional Status Activities of Daily Living (to include ambulation/medication): (Patient-Rptd) Independent Ambulation: (Patient-Rptd) Independent Medication Administration: Independent Home Management: (Patient-Rptd) Independent Manage your own finances?: yes Primary transportation is: driving Concerns about vision?: no *vision screening is required for WTM* Concerns about hearing?: no  Fall Screening Falls in the past year?: (Patient-Rptd) 1 Number of falls in past year: (Patient-Rptd) 0 Was there an injury with Fall?: (Patient-Rptd) 0 Fall Risk Category Calculator: (Patient-Rptd) 1 Patient Fall Risk Level: (Patient-Rptd) Low Fall Risk  Fall Risk Patient at Risk for Falls Due to: No Fall Risks Fall risk Follow up: Falls evaluation completed; Education provided; Falls prevention discussed  Home and Transportation Safety: All rugs have non-skid backing?: yes All stairs or steps have railings?: yes Grab bars in the bathtub or shower?: yes Have non-skid surface in bathtub or shower?: yes Good home lighting?: yes Regular seat belt use?: yes Hospital stays in the last year:: no  Cognitive Assessment Difficulty  concentrating, remembering, or making decisions? : no Will 6CIT or Mini Cog be Completed: no 6CIT or Mini Cog Declined: patient alert, oriented, able to answer questions appropriately and recall recent events  Advance Directives (For Healthcare) Does Patient Have a Medical Advance Directive?: No Would patient like information on creating a medical advance directive?: Yes (MAU/Ambulatory/Procedural Areas - Information given)  Reviewed/Updated  Reviewed/Updated: All        Objective:    Today's Vitals   06/24/24 1103  Weight: 214 lb (97.1 kg)  Height: 5' 7 (1.702 m)   Body mass index is 33.52 kg/m.  Current Medications (verified) Outpatient Encounter Medications as of 06/24/2024  Medication Sig   albuterol  (VENTOLIN  HFA) 108 (90 Base) MCG/ACT inhaler INHALE 1 TO 2 PUFFS BY MOUTH EVERY 6 HOURS AS NEEDED FOR WHEEZING FOR SHORTNESS OF BREATH   atorvastatin  (LIPITOR) 80 MG tablet Take 1 tablet (80 mg total) by mouth daily.   budesonide-glycopyrrolate-formoterol (BREZTRI AEROSPHERE) 160-9-4.8 MCG/ACT AERO inhaler Take 2 puffs first thing in am and then another  2 puffs about 12 hours later.   dapagliflozin propanediol (FARXIGA) 5 MG TABS tablet Take 5 mg by mouth daily.   ezetimibe  (ZETIA ) 10 MG tablet Take 1 tablet (10 mg total) by mouth daily.   furosemide  (LASIX ) 40 MG tablet Take 1 tablet (40 mg total) by mouth daily.   lansoprazole  (PREVACID ) 15 MG capsule Take 1 capsule (15 mg total) by mouth as needed.   OVER THE COUNTER MEDICATION Areds ( omega 3, lutein, zeaxanthin) take one a day   valsartan  (DIOVAN ) 80 MG tablet Take 1 tablet (80 mg total) by mouth daily.   No facility-administered encounter medications on file as of 06/24/2024.   Hearing/Vision screen Hearing Screening - Comments:: Patient is able to hear conversational tones without difficulty. No issues reported.   Vision Screening - Comments::  up to date with routine eye exams with [facility]  Immunizations  and Health Maintenance Health Maintenance  Topic Date Due   DTaP/Tdap/Td (1 - Tdap) Never done   Zoster Vaccines- Shingrix  (1 of 2) Never done   HEMOGLOBIN A1C  10/01/2024   OPHTHALMOLOGY EXAM  11/19/2024   Diabetic kidney evaluation - eGFR measurement  03/31/2025   Diabetic kidney evaluation - Urine ACR  03/31/2025   FOOT EXAM  04/11/2025   Lung Cancer Screening  05/27/2025   Medicare Annual Wellness (AWV)  06/24/2025   Colonoscopy  01/19/2027   Pneumococcal Vaccine: 50+ Years  Completed   Influenza Vaccine  Completed   Meningococcal B Vaccine  Aged Out   COVID-19 Vaccine  Discontinued   Hepatitis C Screening  Discontinued        Assessment/Plan:  This is a routine wellness examination for Jerome Sanchez.  Patient Care Team: Cook, Jayce G, DO as PCP - General (Family Medicine) Mallipeddi, Diannah SQUIBB, MD as PCP - Cardiology (Cardiology) Debera Jayson MATSU, MD (Cardiology) Parrett, Madelin RAMAN, NP as Nurse Practitioner (Pulmonary Disease) Shona Rush, MD (Dermatology) Rachele Gaynell RAMAN, MD as Referring Physician (Nephrology) Jaye Fallow, MD as Referring Physician (Ophthalmology)  I have personally reviewed and noted the following in the patient's chart:   Medical and social history Use of alcohol, tobacco or illicit drugs  Current medications and supplements including opioid prescriptions. Functional ability and status Nutritional status Physical activity Advanced directives List of other physicians Hospitalizations, surgeries, and ER visits in previous 12 months Vitals Screenings to include cognitive, depression, and falls Referrals and appointments  No orders of the defined types were placed in this encounter.  In addition, I have reviewed and discussed with patient certain preventive protocols, quality metrics, and best practice recommendations. A written personalized care plan for preventive services as well as general preventive health recommendations were provided to  patient.   Jerome Charmaine Browner, LPN   [id number]   Return in 1 year (on 06/24/2025).  After Visit Summary: (MyChart) Due to this being a telephonic visit, the after visit summary with patients personalized plan was offered to patient via MyChart   Nurse Notes: No concerns

## 2024-06-24 NOTE — Patient Instructions (Signed)
 [name],  Thank you for taking the time for your Medicare Wellness Visit. I appreciate your continued commitment to your health goals. Please review the care plan we discussed, and feel free to reach out if I can assist you further.  Please note that Annual Wellness Visits do not include a physical exam. Some assessments may be limited, especially if the visit was conducted virtually. If needed, we may recommend an in-person follow-up with your provider.  Ongoing Care Seeing your primary care provider every 3 to 6 months helps us  monitor your health and provide consistent, personalized care.   Referrals If a referral was made during today's visit and you haven't received any updates within two weeks, please contact the referred provider directly to check on the status.  Recommended Screenings:  Health Maintenance  Topic Date Due   DTaP/Tdap/Td vaccine (1 - Tdap) Never done   Zoster (Shingles) Vaccine (1 of 2) Never done   Hemoglobin A1C  10/01/2024   Eye exam for diabetics  11/19/2024   Yearly kidney function blood test for diabetes  03/31/2025   Yearly kidney health urinalysis for diabetes  03/31/2025   Complete foot exam   04/11/2025   Screening for Lung Cancer  05/27/2025   Medicare Annual Wellness Visit  06/24/2025   Colon Cancer Screening  01/19/2027   Pneumococcal Vaccine for age over 18  Completed   Flu Shot  Completed   Meningitis B Vaccine  Aged Out   COVID-19 Vaccine  Discontinued   Hepatitis C Screening  Discontinued       06/20/2024    9:28 AM  Advanced Directives  Does Patient Have a Medical Advance Directive? No  Would patient like information on creating a medical advance directive? Yes (MAU/Ambulatory/Procedural Areas - Information given)   Information on Advanced Care Planning can be found at [place]  Secretary of [facility] Advance Health Care Directives Advance Health Care Directives ([email or web address]    Vision: Annual vision screenings are recommended for  early detection of glaucoma, cataracts, and diabetic retinopathy. These exams can also reveal signs of chronic conditions such as diabetes and high blood pressure.  Dental: Annual dental screenings help detect early signs of oral cancer, gum disease, and other conditions linked to overall health, including heart disease and diabetes.  Please see the attached documents for additional preventive care recommendations.

## 2024-06-27 ENCOUNTER — Encounter: Payer: Self-pay | Admitting: *Deleted

## 2024-07-01 ENCOUNTER — Encounter: Payer: Self-pay | Admitting: Family Medicine

## 2024-07-21 NOTE — Therapy (Deleted)
 OUTPATIENT PHYSICAL THERAPY THORACOLUMBAR EVALUATION   Patient Name: Jerome Sanchez MRN: [id number] DOB:02/22/1951, 73 y.o., male Today's Date: 07/21/2024  END OF SESSION:   Past Medical History:  Diagnosis Date   Allergy    Arthritis    Asthma    Cardiac arrhythmia    Chronic kidney disease    COPD (chronic obstructive pulmonary disease) (HCC)    Essential hypertension, benign    GERD (gastroesophageal reflux disease)    Impaired glucose tolerance    Injury of right rotator cuff    Lumbar disc disease    Mixed hyperlipidemia    OSA (obstructive sleep apnea)    Sleep apnea    Type 2 diabetes mellitus (HCC)    Vitamin D deficiency    Past Surgical History:  Procedure Laterality Date   BIOPSY  01/19/2020   Procedure: BIOPSY;  Surgeon: Golda Claudis PENNER, MD;  [facility];  Service: Endoscopy;;   COLONOSCOPY N/A 01/19/2020   Procedure: COLONOSCOPY;  Surgeon: Golda Claudis PENNER, MD;  [facility];  Service: Endoscopy;  Laterality: N/A;   ESOPHAGOGASTRODUODENOSCOPY N/A 01/19/2020   Procedure: ESOPHAGOGASTRODUODENOSCOPY (EGD);  Surgeon: Golda Claudis PENNER, MD;  [facility];  Service: Endoscopy;  Laterality: N/A;  1200   POLYPECTOMY  01/19/2020   Procedure: POLYPECTOMY;  Surgeon: Golda Claudis PENNER, MD;  [facility];  Service: Endoscopy;;   Patient Active Problem List   Diagnosis Date Noted   Paronychia of great toe 04/27/2024   Type 2 diabetes mellitus with complication (HCC) 10/12/2023   Aortic stenosis 02/04/2023   (HFpEF) heart failure with preserved ejection fraction (HCC) 02/04/2023   OSA (obstructive sleep apnea) 01/13/2023   Lesion of right native kidney 09/09/2022   Stage 3a chronic kidney disease (HCC) 07/15/2022   CAD (coronary artery disease) 07/15/2022   Former cigarette smoker 03/04/2022   Barrett's esophagus 01/26/2020   COPD  GOLD 2 07/27/2018   Mixed hyperlipidemia 08/05/2012   Essential hypertension, benign  10/26/2008    PCP: ***  REFERRING PROVIDER: Cook, Jayce G, DO  REFERRING DIAG:  M16.9 (ICD-10-CM) - Osteoarthritis of hip, unspecified laterality, unspecified osteoarthritis type    Rationale for Evaluation and Treatment: Rehabilitation  THERAPY DIAG:  No diagnosis found.  ONSET DATE: ***  SUBJECTIVE:                                                                                                                                                                                           SUBJECTIVE STATEMENT: ***  PERTINENT HISTORY:  ***  PAIN:  Are you having pain? {OPRCPAIN:27236}  PRECAUTIONS: {Therapy precautions:24002}  RED FLAGS: {PT Red Flags:29287}   WEIGHT BEARING RESTRICTIONS: {Yes ***/No:24003}  FALLS:  Has patient fallen in last 6 months? {fallsyesno:27318}  LIVING ENVIRONMENT: Lives with: {OPRC lives with:25569::lives with their family} Lives in: {Lives in:25570} Stairs: {opstairs:27293} Has following equipment at home: {Assistive devices:23999}  OCCUPATION: ***  PLOF: {PLOF:24004}  PATIENT GOALS: ***  NEXT MD VISIT: ***  OBJECTIVE:  Note: Objective measures were completed at Evaluation unless otherwise noted.  DIAGNOSTIC FINDINGS:  IMPRESSION: 1. Moderate multilevel degenerative disc disease and facet hypertrophy. 2. Straightening of normal lordosis.  IMPRESSION: Minimal osteoarthritis of the left hip.    PATIENT SURVEYS:  {rehab surveys:24030}  COGNITION: Overall cognitive status: {cognition:24006}     SENSATION: {sensation:27233}  MUSCLE LENGTH: Hamstrings: Right *** deg; Left *** deg Debby test: Right *** deg; Left *** deg  POSTURE: {posture:25561}  PALPATION: ***  LUMBAR ROM:   AROM eval  Flexion   Extension   Right lateral flexion   Left lateral flexion   Right rotation   Left rotation    (Blank rows = not tested)  LOWER EXTREMITY ROM:     {AROM/PROM:27142}  Right eval Left eval  Hip flexion    Hip  extension    Hip abduction    Hip adduction    Hip internal rotation    Hip external rotation    Knee flexion    Knee extension    Ankle dorsiflexion    Ankle plantarflexion    Ankle inversion    Ankle eversion     (Blank rows = not tested)  LOWER EXTREMITY MMT:    MMT Right eval Left eval  Hip flexion    Hip extension    Hip abduction    Hip adduction    Hip internal rotation    Hip external rotation    Knee flexion    Knee extension    Ankle dorsiflexion    Ankle plantarflexion    Ankle inversion    Ankle eversion     (Blank rows = not tested)  LUMBAR SPECIAL TESTS:  {lumbar special test:25242}  FUNCTIONAL TESTS:  {Functional tests:24029}  GAIT: Distance walked: *** Assistive device utilized: {Assistive devices:23999} Level of assistance: {Levels of assistance:24026} Comments: ***  TREATMENT DATE: ***                                                                                                                                 PATIENT EDUCATION:  Education details: *** Person educated: {Person educated:25204} Education method: {Education Method:25205} Education comprehension: {Education Comprehension:25206}  HOME EXERCISE PROGRAM: ***  ASSESSMENT:  CLINICAL IMPRESSION: Patient is a *** y.o. *** who was seen today for physical therapy evaluation and treatment for ***.   OBJECTIVE IMPAIRMENTS: {opptimpairments:25111}.   ACTIVITY LIMITATIONS: {activitylimitations:27494}  PARTICIPATION LIMITATIONS: {participationrestrictions:25113}  PERSONAL FACTORS: {Personal factors:25162} are also affecting patient's functional outcome.   REHAB POTENTIAL: {rehabpotential:25112}  CLINICAL DECISION MAKING: {clinical decision making:25114}  EVALUATION COMPLEXITY: {Evaluation  complexity:25115}   GOALS: Goals reviewed with patient? {yes/no:20286}  SHORT TERM GOALS: Target date: ***  *** Baseline: Goal status: INITIAL  2.  *** Baseline:  Goal status:  INITIAL  3.  *** Baseline:  Goal status: INITIAL  4.  *** Baseline:  Goal status: INITIAL  5.  *** Baseline:  Goal status: INITIAL  6.  *** Baseline:  Goal status: INITIAL  LONG TERM GOALS: Target date: ***  *** Baseline:  Goal status: INITIAL  2.  *** Baseline:  Goal status: INITIAL  3.  *** Baseline:  Goal status: INITIAL  4.  *** Baseline:  Goal status: INITIAL  5.  *** Baseline:  Goal status: INITIAL  6.  *** Baseline:  Goal status: INITIAL  PLAN:  PT FREQUENCY: {rehab frequency:25116}  PT DURATION: {rehab duration:25117}  PLANNED INTERVENTIONS: {rehab planned interventions:25118::97110-Therapeutic exercises,97530- Therapeutic [phone]- Neuromuscular re-education,97535- Self Rjmz,02859- Manual therapy,Patient/Family education}.  PLAN FOR NEXT SESSION: ***   Rosaria BRAVO Powell-Butler, PT 07/21/2024, 5:36 PM

## 2024-07-25 ENCOUNTER — Ambulatory Visit (HOSPITAL_COMMUNITY)

## 2024-08-31 NOTE — Therapy (Signed)
 " OUTPATIENT PHYSICAL THERAPY LUMBAR and LOWER EXTREMITY EVALUATION   Patient Name: Jerome Sanchez MRN: [id number] DOB:02/05/1951, 74 y.o., male Today's Date: 09/01/2024  END OF SESSION:  PT End of Session - 09/01/24 1242     Visit Number 1    Number of Visits 5    Date for Recertification  09/26/24    Authorization Type [employer]    Authorization Time Period No auth needed    Progress Note Due on Visit 10    PT Start Time 1245    PT Stop Time 1330    PT Time Calculation (min) 45 min    Activity Tolerance Patient tolerated treatment well    Behavior During Therapy WFL for tasks assessed/performed          Past Medical History:  Diagnosis Date   Allergy    Arthritis    Asthma    Cardiac arrhythmia    Chronic kidney disease    COPD (chronic obstructive pulmonary disease) (HCC)    Essential hypertension, benign    GERD (gastroesophageal reflux disease)    Impaired glucose tolerance    Injury of right rotator cuff    Lumbar disc disease    Mixed hyperlipidemia    OSA (obstructive sleep apnea)    Sleep apnea    Type 2 diabetes mellitus (HCC)    Vitamin D deficiency    Past Surgical History:  Procedure Laterality Date   BIOPSY  01/19/2020   Procedure: BIOPSY;  Surgeon: Golda Claudis PENNER, MD;  [facility];  Service: Endoscopy;;   COLONOSCOPY N/A 01/19/2020   Procedure: COLONOSCOPY;  Surgeon: Golda Claudis PENNER, MD;  [facility];  Service: Endoscopy;  Laterality: N/A;   ESOPHAGOGASTRODUODENOSCOPY N/A 01/19/2020   Procedure: ESOPHAGOGASTRODUODENOSCOPY (EGD);  Surgeon: Golda Claudis PENNER, MD;  [facility];  Service: Endoscopy;  Laterality: N/A;  1200   POLYPECTOMY  01/19/2020   Procedure: POLYPECTOMY;  Surgeon: Golda Claudis PENNER, MD;  [facility];  Service: Endoscopy;;   Patient Active Problem List   Diagnosis Date Noted   Paronychia of great toe 04/27/2024   Type 2 diabetes mellitus with complication (HCC) 10/12/2023    Aortic stenosis 02/04/2023   (HFpEF) heart failure with preserved ejection fraction (HCC) 02/04/2023   OSA (obstructive sleep apnea) 01/13/2023   Lesion of right native kidney 09/09/2022   Stage 3a chronic kidney disease (HCC) 07/15/2022   CAD (coronary artery disease) 07/15/2022   Former cigarette smoker 03/04/2022   Barrett's esophagus 01/26/2020   COPD  GOLD 2 07/27/2018   Mixed hyperlipidemia 08/05/2012   Essential hypertension, benign 10/26/2008    PCP: N/A  REFERRING PROVIDER: Bluford Jacqulyn MATSU, DO  REFERRING DIAG: M16.9 (ICD-10-CM) - Osteoarthritis of hip, unspecified laterality, unspecified osteoarthritis type  THERAPY DIAG:  Other low back pain  Pain of both hip joints  Impaired functional mobility, balance, gait, and endurance  Rationale for Evaluation and Treatment: Rehabilitation  ONSET DATE: 5 years on and off   SUBJECTIVE:   SUBJECTIVE STATEMENT: Patient reports today nothing hurts but previously at the end of the day, his hips would start hurting when he sits for a while and when he goes to stand after sitting. Some days he had sharp pain in low back and hips. Reports he's been feeling better for about a week.   PERTINENT HISTORY: Lumbar disc disease  PAIN:  Are you having pain? Yes: NPRS scale: None currently but 8/10 at worst  Pain location:  Both hips but L>R, and low back, intermittent knee pain Pain description: sharp Aggravating factors: Sitting too long (over 25 min), and transfers after sitting  Relieving factors: Movement  PRECAUTIONS: None  RED FLAGS: None   WEIGHT BEARING RESTRICTIONS: No  FALLS:  Has patient fallen in last 6 months? Yes. Number of falls 1. Tripped over rope  LIVING ENVIRONMENT: Lives with: lives with their spouse Lives in: House/apartment Has following equipment at home: Sitting cane to sit when traveling only. Has walking sticks when walking around property for balance  OCCUPATION: Retired   PLOF:  Independent  PATIENT GOALS: wants to improve mobility and stamina Not sure if he needs PT, for the most part pain has been working itself out  NEXT MD VISIT: February 25th  OBJECTIVE:  Note: Objective measures were completed at Evaluation unless otherwise noted.  DIAGNOSTIC FINDINGS:  IMPRESSION: 1. Moderate multilevel degenerative disc disease and facet hypertrophy. 2. Straightening of normal lordosis.  IMPRESSION: Minimal osteoarthritis of the left hip.  PATIENT SURVEYS:  Modified Oswestry: Modified Oswestry Low Back Pain Disability Questionnaire: 11 / 50 = 22.0 %  Interpretation of scores: Score Category Description  0-20% Minimal Disability The patient can cope with most living activities. Usually no treatment is indicated apart from advice on lifting, sitting and exercise  21-40% Moderate Disability The patient experiences more pain and difficulty with sitting, lifting and standing. Travel and social life are more difficult and they may be disabled from work. Personal care, sexual activity and sleeping are not grossly affected, and the patient can usually be managed by conservative means  41-60% Severe Disability Pain remains the main problem in this group, but activities of daily living are affected. These patients require a detailed investigation  61-80% Crippled Back pain impinges on all aspects of the patients life. Positive intervention is required  81-100% Bed-bound These patients are either bed-bound or exaggerating their symptoms  Bluford FORBES Zoe DELENA Karon DELENA, et al. Surgery versus conservative management of stable thoracolumbar fracture: the PRESTO feasibility RCT. [place] ([place]): [employer]; 2021 Nov. [facility] Technology Assessment, No. 25.62.) Appendix 3, Oswestry Disability Index category descriptors. Available from: Findjewelers.cz  Minimally Clinically Important Difference (MCID) = 12.8%  COGNITION: Overall cognitive  status: Within functional limits for tasks assessed     SENSATION: Light touch: Impaired  Lighter sensation in R dermatome L4-S1   MUSCLE LENGTH: Hamstrings:  ~150  deg bilaterally but reports inc discomfort in hips during 90/90 testing   POSTURE: rounded shoulders, forward head, decreased lumbar lordosis, and increased thoracic kyphosis  PALPATION: TTP around L2 level  LUMBAR ROM:   AROM eval  Flexion To mid-shins, * in low back  Extension 50% avail, shoulder pain   Right lateral flexion   Left lateral flexion   Right rotation WFL  Left rotation WFL   (Blank rows = not tested)   *=pain  LOWER EXTREMITY ROM:  Active ROM Right eval Left eval  Hip flexion 110 95 *  Hip extension    Hip abduction    Hip adduction    Hip internal rotation    Hip external rotation    Knee flexion    Knee extension    Ankle dorsiflexion    Ankle plantarflexion    Ankle inversion    Ankle eversion     (Blank rows = not tested)  LOWER EXTREMITY MMT:  MMT Right eval Left eval  Hip flexion 4- 4- * in hip  Hip extension 4 4-  Hip  abduction 4+ 4 * in hip  Hip adduction    Hip internal rotation    Hip external rotation    Knee flexion 4 4  Knee extension 4+ 4+  Ankle dorsiflexion 5 5  Ankle plantarflexion    Ankle inversion    Ankle eversion     (Blank rows = not tested)  *=pain   LOWER EXTREMITY SPECIAL TESTS:  Hip special tests: Belvie (FABER) test: positive   FUNCTIONAL TESTS:  30 seconds chair stand test: 9 STS, demo inc SOB   GAIT: Distance walked: 100 ft In session Assistive device utilized: None Level of assistance: Complete Independence Comments: WFL                                                                                                                                TREATMENT DATE:  09/01/24: PT Eval and HEP    PATIENT EDUCATION:  Education details: PT evaluation, objective findings, POC, Importance of HEP, Precautions, Clinic policies Person  educated: Patient Education method: Explanation and Demonstration Education comprehension: verbalized understanding and returned demonstration  HOME EXERCISE PROGRAM: Access Code: [email or web address] URL: [email or web address] Date: 09/01/2024 Prepared by: Rosaria Powell-Butler  Exercises - Supine Lower Trunk Rotation  - 2 x daily - 7 x weekly - 3 sets - 10 reps - Hooklying Hamstring Stretch with Strap  - 2 x daily - 7 x weekly - 3 sets - 30 hold - Hooklying Single Knee to Chest Stretch  - 2 x daily - 7 x weekly - 3 sets - 10 reps - Supine Bridge  - 2 x daily - 7 x weekly - 3 sets - 10 reps - Sit to Stand  - 2 x daily - 7 x weekly - 3 sets - 10 reps - Sidestepping  - 2 x daily - 7 x weekly - 3 sets - 10 reps -Daily walking  ASSESSMENT:  CLINICAL IMPRESSION: Patient is a 74 y.o. male who was seen today for physical therapy evaluation and treatment for M16.9 (ICD-10-CM) - Osteoarthritis of hip, unspecified laterality, unspecified osteoarthritis type.  On this date, patient demonstrates slightly impaired self perception of function via Modified Oswestry, decreased lumbar ROM, decreased LLE strength compared to R, decreased L hip ROM, and decreased endurance, all of which may be contributing to patient's increased pain, decreased activity tolerance, difficulty with transfers, and impairing their overall function. Patient will benefit from skilled physical therapy to address the above/below deficits in order to improve pain and overall function.    OBJECTIVE IMPAIRMENTS: decreased activity tolerance, decreased endurance, decreased mobility, decreased ROM, decreased strength, impaired perceived functional ability, impaired flexibility, impaired sensation, improper body mechanics, postural dysfunction, and pain.   ACTIVITY LIMITATIONS: sitting, standing, squatting, stairs, and transfers  PARTICIPATION LIMITATIONS: community activity and yard work  PERSONAL FACTORS: Time since onset of  injury/illness/exacerbation are also affecting patient's functional outcome.   REHAB POTENTIAL: Good  CLINICAL DECISION MAKING: Stable/uncomplicated  EVALUATION COMPLEXITY: Low  GOALS: Goals reviewed with patient? No  SHORT TERM GOALS: Target date: 09/16/24 Patient will be independent with performance of HEP to demonstrate adequate self management of symptoms.  Baseline:  Goal status: INITIAL  2.   Patient will report at least a 50% improvement with function and/or pain reduction overall since beginning PT. Baseline:  Goal status: INITIAL   LONG TERM GOALS: Target date: 09/26/24 Patient will improve Modified Oswestry score by 12.8 % in order to demonstrate improved self-perceived disability and overall function while meeting MCID.  Baseline: Goal status: INITIAL  2.  Patient will improve  lumbar flexion ROM to be able to at least reach ankles to demonstrate improved lumbar mobility needed for functional tasks such as tying shoes.  Baseline:  Goal status: INITIAL 3.  Patient will improve all LLE MMT by at least 1/2 a grade to demonstrate increased LE strength and/or power needed for functional transfers.  Baseline:  Goal status: INITIAL   4.  Patient will improve  30 second chair stand  test by at least 2 STS  in order to demonstrate improved LE strength and endurance required for prolonged ambulation.  Baseline: Goal status: INITIAL  5. Patient will improve L hip flexion ROM to at least 100 degrees to demonstrate improved ROM needed for functional tasks such as squatting when working in [employer].  Baseline: Goal status: INITIAL      PLAN:  PT FREQUENCY: 1-2x/week  PT DURATION: 4 weeks  PLANNED INTERVENTIONS: 97164- PT Re-evaluation, 97110-Therapeutic exercises, 97530- Therapeutic activity, [id number]- Neuromuscular re-education, 97535- Self Care, 02859- Manual therapy, [id number]- Gait training, [phone]- Electrical stimulation (manual), [id number]- Ultrasound, 02987- Traction  (mechanical), [phone] (1-2 muscles), 20561 (3+ muscles)- Dry Needling, Patient/Family education, Balance training, Stair training, Taping, Joint mobilization, Spinal mobilization, Cryotherapy, and Moist heat  PLAN FOR NEXT SESSION: Review HEP and goals, continue LE/hip mobility and strengthening, lumbar mobility, manual as appropriate   4:14 PM, 09/01/24 Demonte Dobratz Powell-Butler, PT, DPT [facility] [place]  "

## 2024-09-01 ENCOUNTER — Encounter (HOSPITAL_COMMUNITY): Payer: Self-pay

## 2024-09-01 ENCOUNTER — Encounter: Payer: Self-pay | Admitting: Internal Medicine

## 2024-09-01 ENCOUNTER — Other Ambulatory Visit: Payer: Self-pay

## 2024-09-01 ENCOUNTER — Ambulatory Visit (HOSPITAL_COMMUNITY): Attending: Family Medicine

## 2024-09-01 DIAGNOSIS — M25551 Pain in right hip: Secondary | ICD-10-CM | POA: Diagnosis present

## 2024-09-01 DIAGNOSIS — M169 Osteoarthritis of hip, unspecified: Secondary | ICD-10-CM | POA: Diagnosis not present

## 2024-09-01 DIAGNOSIS — M5459 Other low back pain: Secondary | ICD-10-CM | POA: Diagnosis present

## 2024-09-01 DIAGNOSIS — Z7409 Other reduced mobility: Secondary | ICD-10-CM | POA: Insufficient documentation

## 2024-09-01 DIAGNOSIS — M25552 Pain in left hip: Secondary | ICD-10-CM | POA: Insufficient documentation

## 2024-09-06 ENCOUNTER — Other Ambulatory Visit: Payer: Self-pay | Admitting: Urology

## 2024-09-06 DIAGNOSIS — N281 Cyst of kidney, acquired: Secondary | ICD-10-CM

## 2024-09-07 ENCOUNTER — Encounter (HOSPITAL_COMMUNITY): Payer: Self-pay

## 2024-09-07 ENCOUNTER — Ambulatory Visit (HOSPITAL_COMMUNITY)

## 2024-09-15 ENCOUNTER — Ambulatory Visit (HOSPITAL_COMMUNITY): Admitting: Physical Therapy

## 2024-09-15 DIAGNOSIS — M5459 Other low back pain: Secondary | ICD-10-CM | POA: Diagnosis not present

## 2024-09-15 DIAGNOSIS — M25551 Pain in right hip: Secondary | ICD-10-CM

## 2024-09-15 DIAGNOSIS — Z7409 Other reduced mobility: Secondary | ICD-10-CM

## 2024-09-15 NOTE — Therapy (Signed)
 " OUTPATIENT PHYSICAL THERAPY LUMBAR and LOWER EXTREMITY TREATMENT   Patient Name: Jerome Sanchez MRN: [id number] DOB:1951/07/30, 74 y.o., male Today's Date: 09/15/2024  END OF SESSION:  PT End of Session - 09/15/24 1323     Visit Number 2    Number of Visits 5    Date for Recertification  09/26/24    Authorization Type [employer]    Authorization Time Period No auth needed    Progress Note Due on Visit 10    PT Start Time 0903    PT Stop Time 0945    PT Time Calculation (min) 42 min    Activity Tolerance Patient tolerated treatment well    Behavior During Therapy WFL for tasks assessed/performed           Past Medical History:  Diagnosis Date   Allergy    Arthritis    Asthma    Cardiac arrhythmia    Chronic kidney disease    COPD (chronic obstructive pulmonary disease) (HCC)    Essential hypertension, benign    GERD (gastroesophageal reflux disease)    Impaired glucose tolerance    Injury of right rotator cuff    Lumbar disc disease    Mixed hyperlipidemia    OSA (obstructive sleep apnea)    Sleep apnea    Type 2 diabetes mellitus (HCC)    Vitamin D deficiency    Past Surgical History:  Procedure Laterality Date   BIOPSY  01/19/2020   Procedure: BIOPSY;  Surgeon: Golda Claudis PENNER, MD;  [facility];  Service: Endoscopy;;   COLONOSCOPY N/A 01/19/2020   Procedure: COLONOSCOPY;  Surgeon: Golda Claudis PENNER, MD;  [facility];  Service: Endoscopy;  Laterality: N/A;   ESOPHAGOGASTRODUODENOSCOPY N/A 01/19/2020   Procedure: ESOPHAGOGASTRODUODENOSCOPY (EGD);  Surgeon: Golda Claudis PENNER, MD;  [facility];  Service: Endoscopy;  Laterality: N/A;  1200   POLYPECTOMY  01/19/2020   Procedure: POLYPECTOMY;  Surgeon: Golda Claudis PENNER, MD;  [facility];  Service: Endoscopy;;   Patient Active Problem List   Diagnosis Date Noted   Paronychia of great toe 04/27/2024   Type 2 diabetes mellitus with complication (HCC)  10/12/2023   Aortic stenosis 02/04/2023   (HFpEF) heart failure with preserved ejection fraction (HCC) 02/04/2023   OSA (obstructive sleep apnea) 01/13/2023   Lesion of right native kidney 09/09/2022   Stage 3a chronic kidney disease (HCC) 07/15/2022   CAD (coronary artery disease) 07/15/2022   Former cigarette smoker 03/04/2022   Barrett's esophagus 01/26/2020   COPD  GOLD 2 07/27/2018   Mixed hyperlipidemia 08/05/2012   Essential hypertension, benign 10/26/2008    PCP: N/A  REFERRING PROVIDER: Bluford Jacqulyn MATSU, DO  REFERRING DIAG: M16.9 (ICD-10-CM) - Osteoarthritis of hip, unspecified laterality, unspecified osteoarthritis type  THERAPY DIAG:  Other low back pain  Pain of both hip joints  Impaired functional mobility, balance, gait, and endurance  Rationale for Evaluation and Treatment: Rehabilitation  ONSET DATE: 5 years on and off   SUBJECTIVE:   SUBJECTIVE STATEMENT: Patient reports no pain, doing well overall today.   Reports compliance with HEP.    PERTINENT HISTORY: Lumbar disc disease  PAIN:  Are you having pain? Yes: NPRS scale: None currently but 8/10 at worst  Pain location: Both hips but L>R, and low back, intermittent knee pain Pain description: sharp Aggravating factors: Sitting too long (over 25 min), and transfers after sitting  Relieving factors: Movement  PRECAUTIONS: None  RED FLAGS: None  WEIGHT BEARING RESTRICTIONS: No  FALLS:  Has patient fallen in last 6 months? Yes. Number of falls 1. Tripped over rope  LIVING ENVIRONMENT: Lives with: lives with their spouse Lives in: House/apartment Has following equipment at home: Sitting cane to sit when traveling only. Has walking sticks when walking around property for balance  OCCUPATION: Retired   PLOF: Independent  PATIENT GOALS: wants to improve mobility and stamina Not sure if he needs PT, for the most part pain has been working itself out  NEXT MD VISIT: February  25th  OBJECTIVE:  Note: Objective measures were completed at Evaluation unless otherwise noted.  DIAGNOSTIC FINDINGS:  IMPRESSION: 1. Moderate multilevel degenerative disc disease and facet hypertrophy. 2. Straightening of normal lordosis.  IMPRESSION: Minimal osteoarthritis of the left hip.  PATIENT SURVEYS:  Modified Oswestry: Modified Oswestry Low Back Pain Disability Questionnaire: 11 / 50 = 22.0 %  Interpretation of scores: Score Category Description  0-20% Minimal Disability The patient can cope with most living activities. Usually no treatment is indicated apart from advice on lifting, sitting and exercise  21-40% Moderate Disability The patient experiences more pain and difficulty with sitting, lifting and standing. Travel and social life are more difficult and they may be disabled from work. Personal care, sexual activity and sleeping are not grossly affected, and the patient can usually be managed by conservative means  41-60% Severe Disability Pain remains the main problem in this group, but activities of daily living are affected. These patients require a detailed investigation  61-80% Crippled Back pain impinges on all aspects of the patients life. Positive intervention is required  81-100% Bed-bound These patients are either bed-bound or exaggerating their symptoms  Bluford FORBES Zoe DELENA Karon DELENA, et al. Surgery versus conservative management of stable thoracolumbar fracture: the PRESTO feasibility RCT. [place] ([place]): [employer]; 2021 Nov. [facility] Technology Assessment, No. 25.62.) Appendix 3, Oswestry Disability Index category descriptors. Available from: Findjewelers.cz  Minimally Clinically Important Difference (MCID) = 12.8%  COGNITION: Overall cognitive status: Within functional limits for tasks assessed     SENSATION: Light touch: Impaired  Lighter sensation in R dermatome L4-S1   MUSCLE LENGTH: Hamstrings:  ~150   deg bilaterally but reports inc discomfort in hips during 90/90 testing   POSTURE: rounded shoulders, forward head, decreased lumbar lordosis, and increased thoracic kyphosis  PALPATION: TTP around L2 level  LUMBAR ROM:   AROM eval  Flexion To mid-shins, * in low back  Extension 50% avail, shoulder pain   Right lateral flexion   Left lateral flexion   Right rotation WFL  Left rotation WFL   (Blank rows = not tested)   *=pain  LOWER EXTREMITY ROM:  Active ROM Right eval Left eval  Hip flexion 110 95 *  Hip extension    Hip abduction    Hip adduction    Hip internal rotation    Hip external rotation    Knee flexion    Knee extension    Ankle dorsiflexion    Ankle plantarflexion    Ankle inversion    Ankle eversion     (Blank rows = not tested)  LOWER EXTREMITY MMT:  MMT Right eval Left eval  Hip flexion 4- 4- * in hip  Hip extension 4 4-  Hip abduction 4+ 4 * in hip  Hip adduction    Hip internal rotation    Hip external rotation    Knee flexion 4 4  Knee extension 4+ 4+  Ankle dorsiflexion 5 5  Ankle plantarflexion    Ankle inversion    Ankle eversion     (Blank rows = not tested)  *=pain   LOWER EXTREMITY SPECIAL TESTS:  Hip special tests: Belvie (FABER) test: positive   FUNCTIONAL TESTS:  30 seconds chair stand test: 9 STS, demo inc SOB   GAIT: Distance walked: 100 ft In session Assistive device utilized: None Level of assistance: Complete Independence Comments: WFL                                                                                                                                TREATMENT DATE:  09/15/24 Standing:  with bil UE assist heelraises 20X  Hip abduction 10X each  Long sitting hamstring stretch 30 each Supine: TrA isometrics 10X5  hamstring stretch with towel 3X30 each  LTR 10X5  Bridge 10X each  SLR with core stab 10X each Prone lying 1 minute  POE 1 minute  Heelsqueezes 10x5   09/01/24: PT Eval and HEP     PATIENT EDUCATION:  Education details: PT evaluation, objective findings, POC, Importance of HEP, Precautions, Clinic policies Person educated: Patient Education method: Explanation and Demonstration Education comprehension: verbalized understanding and returned demonstration  HOME EXERCISE PROGRAM: Access Code: [email or web address] URL: [email or web address] Date: 09/01/2024 Prepared by: Rosaria Powell-Butler  Exercises - Supine Lower Trunk Rotation  - 2 x daily - 7 x weekly - 3 sets - 10 reps - Hooklying Hamstring Stretch with Strap  - 2 x daily - 7 x weekly - 3 sets - 30 hold - Hooklying Single Knee to Chest Stretch  - 2 x daily - 7 x weekly - 3 sets - 10 reps - Supine Bridge  - 2 x daily - 7 x weekly - 3 sets - 10 reps - Sit to Stand  - 2 x daily - 7 x weekly - 3 sets - 10 reps - Sidestepping  - 2 x daily - 7 x weekly - 3 sets - 10 reps -Daily walking  ASSESSMENT:  CLINICAL IMPRESSION: Reviewed goals and POC moving forward.  Educated on importance of core stabilization and how to recruit these mm.  Pt was able to complete all exercises with minimal cues.  Noted low control with Bridge needing cues to complete slower, more eccentrically.   Pt with extremely tight hamstrings.  Instructed with different ways of completing stretch with long sitting being most user friendly for him.  Encouraged to remain compliant with Hep for best results.  Will need to update HEP next session.  Pt will continue to benefit from skilled physical therapy to address his deficits in order to improve pain and overall function.    OBJECTIVE IMPAIRMENTS: decreased activity tolerance, decreased endurance, decreased mobility, decreased ROM, decreased strength, impaired perceived functional ability, impaired flexibility, impaired sensation, improper body mechanics, postural dysfunction, and pain.   ACTIVITY LIMITATIONS: sitting, standing, squatting, stairs, and transfers  PARTICIPATION LIMITATIONS:  community activity and yard work  PERSONAL FACTORS: Time since onset of injury/illness/exacerbation are also affecting patient's functional outcome.   REHAB POTENTIAL: Good  CLINICAL DECISION MAKING: Stable/uncomplicated  EVALUATION COMPLEXITY: Low   GOALS: Goals reviewed with patient? No  SHORT TERM GOALS: Target date: 09/16/24 Patient will be independent with performance of HEP to demonstrate adequate self management of symptoms.  Baseline:  Goal status: INITIAL  2.   Patient will report at least a 50% improvement with function and/or pain reduction overall since beginning PT. Baseline:  Goal status: INITIAL   LONG TERM GOALS: Target date: 09/26/24 Patient will improve Modified Oswestry score by 12.8 % in order to demonstrate improved self-perceived disability and overall function while meeting MCID.  Baseline: Goal status: INITIAL  2.  Patient will improve  lumbar flexion ROM to be able to at least reach ankles to demonstrate improved lumbar mobility needed for functional tasks such as tying shoes.  Baseline:  Goal status: INITIAL 3.  Patient will improve all LLE MMT by at least 1/2 a grade to demonstrate increased LE strength and/or power needed for functional transfers.  Baseline:  Goal status: INITIAL   4.  Patient will improve  30 second chair stand  test by at least 2 STS  in order to demonstrate improved LE strength and endurance required for prolonged ambulation.  Baseline: Goal status: INITIAL  5. Patient will improve L hip flexion ROM to at least 100 degrees to demonstrate improved ROM needed for functional tasks such as squatting when working in [employer].  Baseline: Goal status: INITIAL      PLAN:  PT FREQUENCY: 1-2x/week  PT DURATION: 4 weeks  PLANNED INTERVENTIONS: 97164- PT Re-evaluation, 97110-Therapeutic exercises, 97530- Therapeutic activity, [id number]- Neuromuscular re-education, 97535- Self Care, 02859- Manual therapy, [id number]- Gait training, [phone]-  Electrical stimulation (manual), [id number]- Ultrasound, [id number]- Traction (mechanical), [phone] (1-2 muscles), 20561 (3+ muscles)- Dry Needling, Patient/Family education, Balance training, Stair training, Taping, Joint mobilization, Spinal mobilization, Cryotherapy, and Moist heat  PLAN FOR NEXT SESSION: Continue with LE/hip mobility and strengthening, lumbar mobility, manual as appropriate. Update HEP next session.   1:24 PM, 09/15/24 Greig KATHEE Fuse, PTA/CLT [facility] Ph: [phone]  "

## 2024-09-21 ENCOUNTER — Ambulatory Visit (HOSPITAL_COMMUNITY): Admitting: Physical Therapy

## 2024-09-21 DIAGNOSIS — M5459 Other low back pain: Secondary | ICD-10-CM

## 2024-09-21 DIAGNOSIS — Z7409 Other reduced mobility: Secondary | ICD-10-CM

## 2024-09-21 DIAGNOSIS — M25551 Pain in right hip: Secondary | ICD-10-CM

## 2024-09-21 NOTE — Therapy (Signed)
 " OUTPATIENT PHYSICAL THERAPY LUMBAR and LOWER EXTREMITY TREATMENT   Patient Name: Jerome Sanchez MRN: [id number] DOB:11-Oct-1950, 74 y.o., male Today's Date: 09/21/2024  END OF SESSION:  PT End of Session - 09/21/24 1422     Visit Number 3    Number of Visits 5    Date for Recertification  09/26/24    Authorization Type [employer]    Authorization Time Period No auth needed    Progress Note Due on Visit 10    PT Start Time 1119    PT Stop Time 1200    PT Time Calculation (min) 41 min    Activity Tolerance Patient tolerated treatment well    Behavior During Therapy WFL for tasks assessed/performed            Past Medical History:  Diagnosis Date   Allergy    Arthritis    Asthma    Cardiac arrhythmia    Chronic kidney disease    COPD (chronic obstructive pulmonary disease) (HCC)    Essential hypertension, benign    GERD (gastroesophageal reflux disease)    Impaired glucose tolerance    Injury of right rotator cuff    Lumbar disc disease    Mixed hyperlipidemia    OSA (obstructive sleep apnea)    Sleep apnea    Type 2 diabetes mellitus (HCC)    Vitamin D deficiency    Past Surgical History:  Procedure Laterality Date   BIOPSY  01/19/2020   Procedure: BIOPSY;  Surgeon: Golda Claudis PENNER, MD;  [facility]: [facility];  Service: Endoscopy;;   COLONOSCOPY N/A 01/19/2020   Procedure: COLONOSCOPY;  Surgeon: Golda Claudis PENNER, MD;  Location: [facility];  Service: Endoscopy;  Laterality: N/A;   ESOPHAGOGASTRODUODENOSCOPY N/A 01/19/2020   Procedure: ESOPHAGOGASTRODUODENOSCOPY (EGD);  Surgeon: Golda Claudis PENNER, MD;  Location: [facility];  Service: Endoscopy;  Laterality: N/A;  1200   POLYPECTOMY  01/19/2020   Procedure: POLYPECTOMY;  Surgeon: Golda Claudis PENNER, MD;  Location: [facility];  Service: Endoscopy;;   Patient Active Problem List   Diagnosis Date Noted   Paronychia of great toe 04/27/2024   Type 2 diabetes mellitus with complication (HCC)  10/12/2023   Aortic stenosis 02/04/2023   (HFpEF) heart failure with preserved ejection fraction (HCC) 02/04/2023   OSA (obstructive sleep apnea) 01/13/2023   Lesion of right native kidney 09/09/2022   Stage 3a chronic kidney disease (HCC) 07/15/2022   CAD (coronary artery disease) 07/15/2022   Former cigarette smoker 03/04/2022   Barrett's esophagus 01/26/2020   COPD  GOLD 2 07/27/2018   Mixed hyperlipidemia 08/05/2012   Essential hypertension, benign 10/26/2008    PCP: N/A  REFERRING PROVIDER: Bluford Jacqulyn MATSU, DO  REFERRING DIAG: M16.9 (ICD-10-CM) - Osteoarthritis of hip, unspecified laterality, unspecified osteoarthritis type  THERAPY DIAG:  Other low back pain  Pain of both hip joints  Impaired functional mobility, balance, gait, and endurance  Rationale for Evaluation and Treatment: Rehabilitation  ONSET DATE: 5 years on and off   SUBJECTIVE:   SUBJECTIVE STATEMENT: Pt reports he's doing well today.  No pain or issues currently.     PERTINENT HISTORY: Lumbar disc disease  PAIN:  Are you having pain? Yes: NPRS scale: None currently but 8/10 at worst  Pain location: Both hips but L>R, and low back, intermittent knee pain Pain description: sharp Aggravating factors: Sitting too long (over 25 min), and transfers after sitting  Relieving factors: Movement  PRECAUTIONS: None  RED FLAGS: None  WEIGHT BEARING RESTRICTIONS: No  FALLS:  Has patient fallen in last 6 months? Yes. Number of falls 1. Tripped over rope  LIVING ENVIRONMENT: Lives with: lives with their spouse Lives in: House/apartment Has following equipment at home: Sitting cane to sit when traveling only. Has walking sticks when walking around property for balance  OCCUPATION: Retired   PLOF: Independent  PATIENT GOALS: wants to improve mobility and stamina Not sure if he needs PT, for the most part pain has been working itself out  NEXT MD VISIT: February 25th  OBJECTIVE:  Note:  Objective measures were completed at Evaluation unless otherwise noted.  DIAGNOSTIC FINDINGS:  IMPRESSION: 1. Moderate multilevel degenerative disc disease and facet hypertrophy. 2. Straightening of normal lordosis.  IMPRESSION: Minimal osteoarthritis of the left hip.  PATIENT SURVEYS:  Modified Oswestry: Modified Oswestry Low Back Pain Disability Questionnaire: 11 / 50 = 22.0 %  Interpretation of scores: Score Category Description  0-20% Minimal Disability The patient can cope with most living activities. Usually no treatment is indicated apart from advice on lifting, sitting and exercise  21-40% Moderate Disability The patient experiences more pain and difficulty with sitting, lifting and standing. Travel and social life are more difficult and they may be disabled from work. Personal care, sexual activity and sleeping are not grossly affected, and the patient can usually be managed by conservative means  41-60% Severe Disability Pain remains the main problem in this group, but activities of daily living are affected. These patients require a detailed investigation  61-80% Crippled Back pain impinges on all aspects of the patients life. Positive intervention is required  81-100% Bed-bound These patients are either bed-bound or exaggerating their symptoms  Bluford FORBES Zoe DELENA Karon DELENA, et al. Surgery versus conservative management of stable thoracolumbar fracture: the PRESTO feasibility RCT. [place] ([place]): [employer]; 2021 Nov. [facility] Technology Assessment, No. 25.62.) Appendix 3, Oswestry Disability Index category descriptors. Available from: [email or web address].cz  Minimally Clinically Important Difference (MCID) = 12.8%  COGNITION: Overall cognitive status: Within functional limits for tasks assessed     SENSATION: Light touch: Impaired  Lighter sensation in R dermatome L4-S1   MUSCLE LENGTH: Hamstrings:  ~150  deg bilaterally but reports  inc discomfort in hips during 90/90 testing   POSTURE: rounded shoulders, forward head, decreased lumbar lordosis, and increased thoracic kyphosis  PALPATION: TTP around L2 level  LUMBAR ROM:   AROM eval  Flexion To mid-shins, * in low back  Extension 50% avail, shoulder pain   Right lateral flexion   Left lateral flexion   Right rotation WFL  Left rotation WFL   (Blank rows = not tested)   *=pain   LOWER EXTREMITY MMT:  MMT Right eval Left eval  Hip flexion 4- 4- * in hip  Hip extension 4 4-  Hip abduction 4+ 4 * in hip  Hip adduction    Hip internal rotation    Hip external rotation    Knee flexion 4 4  Knee extension 4+ 4+  Ankle dorsiflexion 5 5  Ankle plantarflexion    Ankle inversion    Ankle eversion     (Blank rows = not tested)  *=pain   LOWER EXTREMITY SPECIAL TESTS:  Hip special tests: Belvie (FABER) test: positive   FUNCTIONAL TESTS:  30 seconds chair stand test: 9 STS, demo inc SOB   GAIT: Distance walked: 100 ft In session Assistive device utilized: None Level of assistance: Complete Independence Comments: [facility]  TREATMENT DATE:  09/21/24 Standing:  with bil UE assist heelraises 20X  Hip abduction 10X2 each  Hip extension 10X2 each  Tandem stance 2X30 no UE assist  SLS max of each without UE assist 3Rt, 4 Lt no UE assist  Vectors, 3 way kick 5X5 each with 1 UE assist  Hamstring stretch on 14 step 3X30 each  Slant board stretch 3X30 bilaterally  Lumbar extension on bar 10X Side step squats on 10 foot line 2RT Sit to stands 10X no UE Nustep seat 9 UE/LE 5 minutes level 3 EOS   09/15/24 Standing:  with bil UE assist heelraises 20X  Hip abduction 10X each Long sitting hamstring stretch 30 each Supine: TrA isometrics 10X5  hamstring stretch with towel 3X30 each  LTR 10X5  Bridge 10X each  SLR with  core stab 10X each Prone lying 1 minute  POE 1 minute  Heelsqueezes 10x5   09/01/24: PT Eval and HEP    PATIENT EDUCATION:  Education details: PT evaluation, objective findings, POC, Importance of HEP, Precautions, Clinic policies Person educated: Patient Education method: Explanation and Demonstration Education comprehension: verbalized understanding and returned demonstration  HOME EXERCISE PROGRAM: Access Code: [email or web address] URL: [email or web address] Date: 09/01/2024 Prepared by: Rosaria Powell-Butler  Exercises - Supine Lower Trunk Rotation  - 2 x daily - 7 x weekly - 3 sets - 10 reps - Hooklying Hamstring Stretch with Strap  - 2 x daily - 7 x weekly - 3 sets - 30 hold - Hooklying Single Knee to Chest Stretch  - 2 x daily - 7 x weekly - 3 sets - 10 reps - Supine Bridge  - 2 x daily - 7 x weekly - 3 sets - 10 reps - Sit to Stand  - 2 x daily - 7 x weekly - 3 sets - 10 reps - Sidestepping  - 2 x daily - 7 x weekly - 3 sets - 10 reps -Daily walking  ASSESSMENT:  CLINICAL IMPRESSION: Began session with focus on LE strengthening.  Additional set added with established hip exercises.  Began static balance challenges with ability to maintain full 30 with tandem stance but struggling to maintain greater than 3' with single leg stance without UE.  Added vectors to help improve this and began sidestep squats outside of bars.  Pt with challenge completing squats correctly as tends to shift weight forward onto toes.  Continued with stretches for hamstrings, instructing in standing and also calf stretch.  Updated HEP but forgot to issue a copy to patient.  Finished up on nustep with slight shortness of breath noted.  Pt will continue to benefit from skilled physical therapy to address his deficits in order to improve pain and overall function.    OBJECTIVE IMPAIRMENTS: decreased activity tolerance, decreased endurance, decreased mobility, decreased ROM, decreased strength, impaired  perceived functional ability, impaired flexibility, impaired sensation, improper body mechanics, postural dysfunction, and pain.   ACTIVITY LIMITATIONS: sitting, standing, squatting, stairs, and transfers  PARTICIPATION LIMITATIONS: community activity and yard work  PERSONAL FACTORS: Time since onset of injury/illness/exacerbation are also affecting patient's functional outcome.   REHAB POTENTIAL: Good  CLINICAL DECISION MAKING: Stable/uncomplicated  EVALUATION COMPLEXITY: Low   GOALS: Goals reviewed with patient? No  SHORT TERM GOALS: Target date: 09/16/24 Patient will be independent with performance of HEP to demonstrate adequate self management of symptoms.  Baseline:  Goal status: INITIAL  2.   Patient will report at least a 50% improvement with function and/or pain reduction overall since beginning  PT. Baseline:  Goal status: INITIAL   LONG TERM GOALS: Target date: 09/26/24 Patient will improve Modified Oswestry score by 12.8 % in order to demonstrate improved self-perceived disability and overall function while meeting MCID.  Baseline: Goal status: INITIAL  2.  Patient will improve  lumbar flexion ROM to be able to at least reach ankles to demonstrate improved lumbar mobility needed for functional tasks such as tying shoes.  Baseline:  Goal status: INITIAL 3.  Patient will improve all LLE MMT by at least 1/2 a grade to demonstrate increased LE strength and/or power needed for functional transfers.  Baseline:  Goal status: INITIAL   4.  Patient will improve  30 second chair stand  test by at least 2 STS  in order to demonstrate improved LE strength and endurance required for prolonged ambulation.  Baseline: Goal status: INITIAL  5. Patient will improve L hip flexion ROM to at least 100 degrees to demonstrate improved ROM needed for functional tasks such as squatting when working in [employer].  Baseline: Goal status: INITIAL      PLAN:  PT FREQUENCY:  1-2x/week  PT DURATION: 4 weeks  PLANNED INTERVENTIONS: 97164- PT Re-evaluation, 97110-Therapeutic exercises, 97530- Therapeutic activity, [id number]- Neuromuscular re-education, 97535- Self Care, [id number]- Manual therapy, [id number]- Gait training, [phone]- Electrical stimulation (manual), [id number]- Ultrasound, [id number]- Traction (mechanical), [phone] (1-2 muscles), [id number] (3+ muscles)- Dry Needling, Patient/Family education, Balance training, Stair training, Taping, Joint mobilization, Spinal mobilization, Cryotherapy, and Moist heat  PLAN FOR NEXT SESSION: Continue with LE/hip mobility and strengthening, lumbar mobility, manual as appropriate. Give updated copy of HEP next session including hamstring stretch.   2:23 PM, 09/21/24 Greig KATHEE Fuse, PTA/CLT [facility] Ph: [phone]  "

## 2024-09-26 ENCOUNTER — Ambulatory Visit (HOSPITAL_COMMUNITY)

## 2024-10-12 ENCOUNTER — Ambulatory Visit: Admitting: Family Medicine

## 2024-10-18 ENCOUNTER — Other Ambulatory Visit

## 2024-11-18 ENCOUNTER — Ambulatory Visit: Admitting: Internal Medicine

## 2024-12-01 ENCOUNTER — Ambulatory Visit: Admitting: Internal Medicine

## 2025-06-30 ENCOUNTER — Ambulatory Visit
